# Patient Record
Sex: Male | Born: 1940 | Race: White | Hispanic: No | Marital: Married | Smoking: Never smoker
Health system: Southern US, Community
[De-identification: ages and names within clinical notes are randomized; demographics above are authoritative.]

## PROBLEM LIST (undated history)

## (undated) DIAGNOSIS — E669 Obesity, unspecified: Secondary | ICD-10-CM

## (undated) DIAGNOSIS — Z9989 Dependence on other enabling machines and devices: Secondary | ICD-10-CM

## (undated) DIAGNOSIS — F419 Anxiety disorder, unspecified: Secondary | ICD-10-CM

## (undated) DIAGNOSIS — N200 Calculus of kidney: Secondary | ICD-10-CM

## (undated) DIAGNOSIS — K922 Gastrointestinal hemorrhage, unspecified: Secondary | ICD-10-CM

## (undated) DIAGNOSIS — I10 Essential (primary) hypertension: Secondary | ICD-10-CM

## (undated) DIAGNOSIS — K635 Polyp of colon: Secondary | ICD-10-CM

## (undated) DIAGNOSIS — E119 Type 2 diabetes mellitus without complications: Secondary | ICD-10-CM

## (undated) DIAGNOSIS — Z8719 Personal history of other diseases of the digestive system: Secondary | ICD-10-CM

## (undated) DIAGNOSIS — K5790 Diverticulosis of intestine, part unspecified, without perforation or abscess without bleeding: Secondary | ICD-10-CM

## (undated) DIAGNOSIS — G4733 Obstructive sleep apnea (adult) (pediatric): Secondary | ICD-10-CM

## (undated) DIAGNOSIS — K219 Gastro-esophageal reflux disease without esophagitis: Secondary | ICD-10-CM

## (undated) DIAGNOSIS — M1990 Unspecified osteoarthritis, unspecified site: Secondary | ICD-10-CM

## (undated) HISTORY — PX: LITHOTRIPSY: SUR834

## (undated) HISTORY — PX: KNEE CARTILAGE SURGERY: SHX688

## (undated) HISTORY — PX: TONSILLECTOMY AND ADENOIDECTOMY: SUR1326

## (undated) HISTORY — DX: Diverticulosis of intestine, part unspecified, without perforation or abscess without bleeding: K57.90

---

## 2006-07-08 ENCOUNTER — Ambulatory Visit (HOSPITAL_COMMUNITY): Admission: RE | Admit: 2006-07-08 | Discharge: 2006-07-08 | Payer: Self-pay | Admitting: *Deleted

## 2006-07-18 ENCOUNTER — Ambulatory Visit (HOSPITAL_COMMUNITY): Admission: RE | Admit: 2006-07-18 | Discharge: 2006-07-18 | Payer: Self-pay | Admitting: *Deleted

## 2006-07-18 ENCOUNTER — Encounter (INDEPENDENT_AMBULATORY_CARE_PROVIDER_SITE_OTHER): Payer: Self-pay | Admitting: *Deleted

## 2006-08-29 ENCOUNTER — Ambulatory Visit (HOSPITAL_COMMUNITY): Admission: RE | Admit: 2006-08-29 | Discharge: 2006-08-29 | Payer: Self-pay | Admitting: *Deleted

## 2006-08-29 ENCOUNTER — Encounter (INDEPENDENT_AMBULATORY_CARE_PROVIDER_SITE_OTHER): Payer: Self-pay | Admitting: *Deleted

## 2010-01-28 ENCOUNTER — Encounter: Payer: Self-pay | Admitting: *Deleted

## 2010-04-07 ENCOUNTER — Inpatient Hospital Stay (HOSPITAL_COMMUNITY)
Admission: EM | Admit: 2010-04-07 | Discharge: 2010-04-11 | DRG: 377 | Disposition: A | Discharge status: Home / Self Care | Payer: 59 | Attending: Internal Medicine | Admitting: Internal Medicine

## 2010-04-07 ENCOUNTER — Emergency Department (HOSPITAL_COMMUNITY): Payer: 59

## 2010-04-07 ENCOUNTER — Encounter (HOSPITAL_COMMUNITY): Payer: Self-pay | Admitting: Radiology

## 2010-04-07 DIAGNOSIS — Q391 Atresia of esophagus with tracheo-esophageal fistula: Secondary | ICD-10-CM

## 2010-04-07 DIAGNOSIS — M129 Arthropathy, unspecified: Secondary | ICD-10-CM | POA: Diagnosis present

## 2010-04-07 DIAGNOSIS — G4733 Obstructive sleep apnea (adult) (pediatric): Secondary | ICD-10-CM | POA: Diagnosis present

## 2010-04-07 DIAGNOSIS — Z7982 Long term (current) use of aspirin: Secondary | ICD-10-CM

## 2010-04-07 DIAGNOSIS — I10 Essential (primary) hypertension: Secondary | ICD-10-CM | POA: Diagnosis present

## 2010-04-07 DIAGNOSIS — D126 Benign neoplasm of colon, unspecified: Secondary | ICD-10-CM | POA: Diagnosis present

## 2010-04-07 DIAGNOSIS — K219 Gastro-esophageal reflux disease without esophagitis: Secondary | ICD-10-CM | POA: Diagnosis present

## 2010-04-07 DIAGNOSIS — D62 Acute posthemorrhagic anemia: Secondary | ICD-10-CM | POA: Diagnosis present

## 2010-04-07 DIAGNOSIS — K449 Diaphragmatic hernia without obstruction or gangrene: Secondary | ICD-10-CM | POA: Diagnosis present

## 2010-04-07 DIAGNOSIS — E119 Type 2 diabetes mellitus without complications: Secondary | ICD-10-CM | POA: Diagnosis present

## 2010-04-07 DIAGNOSIS — E876 Hypokalemia: Secondary | ICD-10-CM | POA: Diagnosis not present

## 2010-04-07 DIAGNOSIS — K5711 Diverticulosis of small intestine without perforation or abscess with bleeding: Principal | ICD-10-CM | POA: Diagnosis present

## 2010-04-07 HISTORY — DX: Essential (primary) hypertension: I10

## 2010-04-07 LAB — DIFFERENTIAL
Basophils Absolute: 0 10*3/uL (ref 0.0–0.1)
Eosinophils Absolute: 0 10*3/uL (ref 0.0–0.7)
Eosinophils Absolute: 0.1 10*3/uL (ref 0.0–0.7)
Eosinophils Relative: 1 % (ref 0–5)
Lymphs Abs: 0.8 10*3/uL (ref 0.7–4.0)
Monocytes Absolute: 0.4 10*3/uL (ref 0.1–1.0)
Monocytes Relative: 4 % (ref 3–12)
Neutrophils Relative %: 89 % — ABNORMAL HIGH (ref 43–77)

## 2010-04-07 LAB — CBC
HCT: 32.8 % — ABNORMAL LOW (ref 39.0–52.0)
Hemoglobin: 11.3 g/dL — ABNORMAL LOW (ref 13.0–17.0)
MCH: 31.1 pg (ref 26.0–34.0)
MCHC: 33.8 g/dL (ref 30.0–36.0)
MCV: 90.4 fL (ref 78.0–100.0)
MCV: 90.2 fL (ref 78.0–100.0)
Platelets: 235 10*3/uL (ref 150–400)
Platelets: 208 10*3/uL (ref 150–400)
RBC: 3.63 MIL/uL — ABNORMAL LOW (ref 4.22–5.81)
RDW: 12.3 % (ref 11.5–15.5)
WBC: 8.4 10*3/uL (ref 4.0–10.5)

## 2010-04-07 LAB — COMPREHENSIVE METABOLIC PANEL
Alkaline Phosphatase: 50 U/L (ref 39–117)
BUN: 19 mg/dL (ref 6–23)
CO2: 25 mEq/L (ref 19–32)
Chloride: 104 mEq/L (ref 96–112)
Creatinine, Ser: 1.04 mg/dL (ref 0.4–1.5)
GFR calc non Af Amer: >60 mL/min (ref >60)
Glucose, Bld: 318 mg/dL — ABNORMAL HIGH (ref 70–99)
Total Bilirubin: 1.1 mg/dL (ref 0.3–1.2)

## 2010-04-07 LAB — PROTIME-INR
INR: 1.09 (ref 0.00–1.49)
Prothrombin Time: 14.3 seconds (ref 11.6–15.2)

## 2010-04-07 LAB — HEMOGLOBIN AND HEMATOCRIT, BLOOD
HCT: 26.1 % — ABNORMAL LOW (ref 39.0–52.0)
HCT: 32 % — ABNORMAL LOW (ref 39.0–52.0)
Hemoglobin: 9 g/dL — ABNORMAL LOW (ref 13.0–17.0)

## 2010-04-07 LAB — APTT: aPTT: 27 seconds (ref 24–37)

## 2010-04-07 LAB — ABO/RH: ABO/RH(D): O POS

## 2010-04-07 MED ORDER — IOHEXOL 300 MG/ML  SOLN
100.0000 mL | Freq: Once | INTRAMUSCULAR | Status: AC | PRN
  Administered 2010-04-07: 100 mL via INTRAVENOUS

## 2010-04-08 ENCOUNTER — Inpatient Hospital Stay (HOSPITAL_COMMUNITY): Payer: 59

## 2010-04-08 DIAGNOSIS — K625 Hemorrhage of anus and rectum: Secondary | ICD-10-CM

## 2010-04-08 DIAGNOSIS — I959 Hypotension, unspecified: Secondary | ICD-10-CM

## 2010-04-08 DIAGNOSIS — K921 Melena: Secondary | ICD-10-CM

## 2010-04-08 DIAGNOSIS — R579 Shock, unspecified: Secondary | ICD-10-CM

## 2010-04-08 DIAGNOSIS — D62 Acute posthemorrhagic anemia: Secondary | ICD-10-CM

## 2010-04-08 DIAGNOSIS — K222 Esophageal obstruction: Secondary | ICD-10-CM

## 2010-04-08 LAB — BASIC METABOLIC PANEL
BUN: 11 mg/dL (ref 6–23)
Chloride: 108 mEq/L (ref 96–112)
GFR calc non Af Amer: >60 mL/min (ref >60)
Glucose, Bld: 138 mg/dL — ABNORMAL HIGH (ref 70–99)
Potassium: 3.8 mEq/L (ref 3.5–5.1)
Sodium: 140 mEq/L (ref 135–145)

## 2010-04-08 LAB — GLUCOSE, CAPILLARY
Glucose-Capillary: 174 mg/dL — ABNORMAL HIGH (ref 70–99)
Glucose-Capillary: 169 mg/dL — ABNORMAL HIGH (ref 70–99)

## 2010-04-08 LAB — CBC
HCT: 29.9 % — ABNORMAL LOW (ref 39.0–52.0)
Hemoglobin: 10.3 g/dL — ABNORMAL LOW (ref 13.0–17.0)
MCHC: 34.4 g/dL (ref 30.0–36.0)
MCV: 86.4 fL (ref 78.0–100.0)

## 2010-04-08 LAB — HEMOGLOBIN AND HEMATOCRIT, BLOOD: Hemoglobin: 11.9 g/dL — ABNORMAL LOW (ref 13.0–17.0)

## 2010-04-08 LAB — PREPARE RBC (CROSSMATCH)

## 2010-04-09 ENCOUNTER — Other Ambulatory Visit: Payer: Self-pay | Admitting: Gastroenterology

## 2010-04-09 DIAGNOSIS — K921 Melena: Secondary | ICD-10-CM

## 2010-04-09 DIAGNOSIS — K5730 Diverticulosis of large intestine without perforation or abscess without bleeding: Secondary | ICD-10-CM

## 2010-04-09 DIAGNOSIS — D126 Benign neoplasm of colon, unspecified: Secondary | ICD-10-CM

## 2010-04-09 LAB — CBC
HCT: 29.2 % — ABNORMAL LOW (ref 39.0–52.0)
MCH: 29.7 pg (ref 26.0–34.0)
MCHC: 34.4 g/dL (ref 30.0–36.0)
MCV: 86.4 fL (ref 78.0–100.0)
MCV: 86.3 fL (ref 78.0–100.0)
Platelets: 155 10*3/uL (ref 150–400)
Platelets: 157 10*3/uL (ref 150–400)
RBC: 3.38 MIL/uL — ABNORMAL LOW (ref 4.22–5.81)
RDW: 14.7 % (ref 11.5–15.5)
WBC: 8.9 10*3/uL (ref 4.0–10.5)
WBC: 9.6 10*3/uL (ref 4.0–10.5)

## 2010-04-09 LAB — DIFFERENTIAL
Eosinophils Absolute: 0.3 10*3/uL (ref 0.0–0.7)
Eosinophils Relative: 3 % (ref 0–5)
Lymphs Abs: 1.1 10*3/uL (ref 0.7–4.0)
Monocytes Absolute: 0.5 10*3/uL (ref 0.1–1.0)
Monocytes Relative: 5 % (ref 3–12)

## 2010-04-09 LAB — GLUCOSE, CAPILLARY
Glucose-Capillary: 154 mg/dL — ABNORMAL HIGH (ref 70–99)
Glucose-Capillary: 127 mg/dL — ABNORMAL HIGH (ref 70–99)

## 2010-04-09 LAB — BASIC METABOLIC PANEL
Calcium: 7.4 mg/dL — ABNORMAL LOW (ref 8.4–10.5)
Creatinine, Ser: 0.78 mg/dL (ref 0.4–1.5)
GFR calc non Af Amer: >60 mL/min (ref >60)
Glucose, Bld: 80 mg/dL (ref 70–99)
Sodium: 143 mEq/L (ref 135–145)

## 2010-04-10 DIAGNOSIS — K5731 Diverticulosis of large intestine without perforation or abscess with bleeding: Secondary | ICD-10-CM

## 2010-04-10 LAB — CBC
HCT: 25.3 % — ABNORMAL LOW (ref 39.0–52.0)
Hemoglobin: 9 g/dL — ABNORMAL LOW (ref 13.0–17.0)
MCHC: 34.7 g/dL (ref 30.0–36.0)
MCHC: 35.6 g/dL (ref 30.0–36.0)
MCV: 86.9 fL (ref 78.0–100.0)
Platelets: 163 10*3/uL (ref 150–400)
RDW: 14.5 % (ref 11.5–15.5)
RDW: 14.3 % (ref 11.5–15.5)
WBC: 8 10*3/uL (ref 4.0–10.5)
WBC: 7.4 10*3/uL (ref 4.0–10.5)

## 2010-04-10 LAB — BASIC METABOLIC PANEL
Calcium: 7.6 mg/dL — ABNORMAL LOW (ref 8.4–10.5)
GFR calc non Af Amer: >60 mL/min (ref >60)
Potassium: 3.1 mEq/L — ABNORMAL LOW (ref 3.5–5.1)
Sodium: 140 mEq/L (ref 135–145)

## 2010-04-10 LAB — GLUCOSE, CAPILLARY
Glucose-Capillary: 113 mg/dL — ABNORMAL HIGH (ref 70–99)
Glucose-Capillary: 175 mg/dL — ABNORMAL HIGH (ref 70–99)
Glucose-Capillary: 220 mg/dL — ABNORMAL HIGH (ref 70–99)
Glucose-Capillary: 232 mg/dL — ABNORMAL HIGH (ref 70–99)

## 2010-04-11 LAB — CBC
HCT: 25 % — ABNORMAL LOW (ref 39.0–52.0)
MCH: 31.7 pg (ref 26.0–34.0)
MCV: 88 fL (ref 78.0–100.0)
Platelets: 172 10*3/uL (ref 150–400)
RBC: 2.84 MIL/uL — ABNORMAL LOW (ref 4.22–5.81)
WBC: 6.3 10*3/uL (ref 4.0–10.5)

## 2010-04-11 LAB — TYPE AND SCREEN
ABO/RH(D): O POS
Antibody Screen: NEGATIVE
Unit division: 0
Unit division: 0
Unit division: 0
Unit division: 0

## 2010-04-11 LAB — BASIC METABOLIC PANEL
BUN: 5 mg/dL — ABNORMAL LOW (ref 6–23)
CO2: 25 mEq/L (ref 19–32)
Chloride: 113 mEq/L — ABNORMAL HIGH (ref 96–112)
Creatinine, Ser: 0.87 mg/dL (ref 0.4–1.5)
Glucose, Bld: 120 mg/dL — ABNORMAL HIGH (ref 70–99)
Potassium: 3.4 mEq/L — ABNORMAL LOW (ref 3.5–5.1)

## 2010-04-11 LAB — GLUCOSE, CAPILLARY: Glucose-Capillary: 200 mg/dL — ABNORMAL HIGH (ref 70–99)

## 2010-04-15 NOTE — Discharge Summary (Signed)
NAME:  Aaron White, Aaron White NO.:  [id number]  MEDICAL RECORD NO.:  [id number]           PATIENT TYPE:  I  LOCATION:  3701                         FACILITY:  [facility]  PHYSICIAN:  Lonia Blood, M.D.       DATE OF BIRTH:  04-12-1940  DATE OF ADMISSION:  04/07/2010 DATE OF DISCHARGE:  04/11/2010                              DISCHARGE SUMMARY   PRIMARY CARE PHYSICIAN:  Juline Patch, MD  DISCHARGE DIAGNOSES: 1. Lower gastrointestinal bleeding with hemorrhagic shock. 2. Acute blood loss anemia status post transfused 6 units of packed     red blood cells. 3. Diabetes mellitus type 2. 4. Obstructive sleep apnea. 5. Hypertension. 6. Gastroesophageal reflux disease. 7. History of colon polyps. 8. Esophageal ring.  DISCHARGE MEDICATIONS: 1. Tylenol 650 mg by mouth every 4 hours as needed for pain. 2. Omeprazole 40 mg twice a day. 3. Metformin 500 mg by mouth twice a day. 4. Micardis/hydrochlorothiazide 80/25 daily. 5. Multivitamin daily. 6. Osteo Bi-Flex twice a day. 7. Resveratrol 1 capsule daily. 8. Saw Palmetto 1 capsule by mouth twice a day. 9. Vitamin C 500 mg daily. 10.Vitamin E 400 units daily.  CONDITION ON DISCHARGE:  Aaron White was discharged in good condition, alert, oriented, no acute distress.  No further bleeding.  Aaron White was told to hold his aspirin until Aaron White sees Dr. Marina Goodell.  The patient will follow up with Dr. Yancey Flemings from [facility], call [phone] to schedule an appointment.  Aaron White will also follow up with Dr. Juline Patch. At the time of discharge, the patient's discharge hemoglobin is 9.  PROCEDURE THIS ADMISSION: 1. The patient underwent transfusion of 6 units of packed red blood     cells. 2. Placement of central line into the right subclavian. 3. Colonoscopy with finding of diverticuli. 4. Upper endoscopy with findings of an esophageal ring hiatal hernia     and no source of bleeding in the stomach or duodenum.  CONSULTATION DURING  THIS ADMISSION:  The patient was seen in consultation by [facility].  HOSPITAL COURSE:  For complete hospital course, refer to the dictated progress note done by Dr. Ramiro Harvest on April 10, 2010.  Briefly, Aaron White is a 70 year old gentleman who presented to the emergency room with massive lower gastrointestinal bleeding.  Aaron White was admitted to the intensive care unit and had a right subclavian central line placed. Aaron White was transfused 6 units of packed red blood cells.  Aaron White initially underwent emergent endoscopy without any source of upper gastrointestinal bleeding being found.  Aaron White subsequently underwent colonoscopy with findings of multiple diverticuli felt to be the source of his current bleeding.  Aaron White stabilized over the course of the hospitalization and his discharge hemoglobin was 9.  Aaron White was observed for 24 hours during which period of time Aaron White had no further bleeding and had normal bowel movements.  Aaron White is getting discharged today, April 11, 2010, in good condition.     Lonia Blood, M.D.     SL/MEDQ  D:  04/11/2010  T:  04/11/2010  Job:  [id number]  cc:   Juline Patch, M.D. Wilhemina Bonito. Marina Goodell,  MD  Electronically Signed by Lonia Blood M.D. on 04/15/2010 10:23:20 AM

## 2010-04-16 NOTE — H&P (Signed)
NAME:  Aaron White, Aaron White NO.:  [id number]  MEDICAL RECORD NO.:  [id number]           PATIENT TYPE:  E  LOCATION:  [facility]                         FACILITY:  [facility]  PHYSICIAN:  Jeoffrey Massed, MD    DATE OF BIRTH:  [birth year]  DATE OF ADMISSION:  04/07/2010 DATE OF DISCHARGE:                             HISTORY & PHYSICAL   PRIMARY CARE PRACTITIONER:  Juline Patch, MD  CHIEF COMPLAINT:  Hematochezia x3 episode.  HISTORY OF PRESENT ILLNESS:  The patient is a 70 year old Caucasian male with a past medical history of diabetes, hypertension, history of prior lower GI bleed secondary to questionable bleeding polyp and apparently gets surveillance colonoscopy every 3 years.  Comes in with the above- noted complaints.  Per patient, he was in his usual state of health. Aaron White out with his son to eat dinner last night.  Came back home, felt uneasy in his lower belly and then had a bowel movement that was grosslybloody.  This morning, he woke up and had further two episodes of bloody bowel movements.  The last episode being around 9 a.m.  He subsequently presented to the ED for further evaluation.  The patient does claim he has some mild lower abdominal discomfort, but denies pain.  The patient denies fever.  The patient denies nausea.  The patient denies shortness of breath.  He also denies chest pain.  Denies any headache.  ALLERGIES:  APPARENTLY PENICILLIN, WHICH CAUSES A RASH.  PAST MEDICAL HISTORY: 1. Diabetes. 2. Hypertension. 3. Gastroesophageal reflux disease.  PAST SURGICAL HISTORY:  None.  MEDICATIONS:  At home include, 1. Metformin 500 mg 1 tablet twice daily. 2. Omeprazole 20 mg 1 capsule daily. 3. Micardis HCTZ 80/25 one tablet daily. 4. Aspirin 81 mg 1 tablet daily. 5. Multivitamins 1 tablet daily. 6. Vitamin C 500 mg 1 tablet daily. 7. Vitamin E 400 units 1 capsule daily.  FAMILY HISTORY:  Noncontributory.  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  He denies any toxic habits.  REVIEW OF SYSTEMS:  A detailed review of 12 systems were done and these are negative except for the one mentioned in the HPI.  PHYSICAL EXAM:  VITAL SIGNS:  Initial vital signs was temperature of 97.3, heart rate of 103, blood pressure 108/62, respiration of 20, and pulse ox of 97% on room air. GENERAL:  The patient lying in bed, does not appear to be in any distress. HEENT:  Atraumatic, normocephalic.  Pupils equally react to light and accommodation. NECK:  Supple. CHEST:  Bilaterally clear to auscultation. CARDIOVASCULAR:  Heart sounds are regular.  No murmurs heard. ABDOMEN:  Soft, nontender, and nondistended. EXTREMITIES:  There is no edema. NEUROLOGY:  The patient is alert and oriented x3 and there are no focal neurological deficits.  LABORATORY DATA: 1. CBC showed a WBC of 12.6, hemoglobin of 11.3, and a platelet count     of 235. 2. Chemistry shows sodium of 137, potassium of 4.2, chloride of 104,     bicarb of 25, glucose of 318, BUN of 19, creatinine of 1.04.  Liver function test  shows a total bilirubin of 1.1, alkaline phosphatase of 50, AST of 21, ALT of 19, total protein of 6.1, albumin of 3.5, and a calcium of 8.9. 1. INR is 1.09. 2. PTT is 27.  ASSESSMENT: 1. Lower gastrointestinal bleed, etiology not very evident, rule out     ischemic colitis, rule out diverticular bleed, rule out bleeding     polyp. 2. Hypertension, currently stable. 3. History of diabetes, currently moderately well controlled.  PLAN: 1. This patient will be admitted to a telemetry unit.  He will be     hydrated. 2. He will be type and crossmatch. 3. We will follow his hemoglobin and hematocrit closely for the next     24 hours and if his hemoglobin continues to drop, we will transfuse     him. 4. GI consultation with [facility] has already been     obtained. 5. We will continue his proton pump  inhibitor. 6. We will get an acute abdominal series for now. 7. Further recommendations and plan will be based as the patient's     clinical course evolves. 8. DVT prophylaxis with bilateral SCDs.  CODE STATUS:  Patient is a full code.  Total time spent 45 minutes.     Jeoffrey Massed, MD     SG/MEDQ  D:  04/07/2010  T:  04/07/2010  Job:  [id number]  cc:   Juline Patch, M.D.  Electronically Signed by Jeoffrey Massed  on 04/16/2010 08:28:39 PM

## 2010-04-18 ENCOUNTER — Telehealth: Payer: Self-pay | Admitting: Gastroenterology

## 2010-04-18 NOTE — Telephone Encounter (Signed)
It is really not necessary for him to followup with me for an acute diverticular bleed which has been worked up and resolved. He should however followup with his PCP. If for some reason he would like to see me, then that would be fine.

## 2010-04-18 NOTE — Telephone Encounter (Signed)
Pt with ENDO on 04/08/10 with Dr Marina Goodell showing esophageal ring, HH no active bleeding. COLON on 04/09/10 with Dr Arlyce Dice showing diverticula, old blood and polyp. Discharge note listed pt to f/u with Dr Marina Goodell. Dr Marina Goodell is this correct?

## 2010-04-18 NOTE — Telephone Encounter (Signed)
Informed pt that Dr Marina Goodell stated he can f/u with his PCP since his problem was acute and has been resolved, but if he chooses, Dr Marina Goodell would be happy to see him. Pt verbalized understanding and will see Dr Ricki Miller next week.

## 2010-05-16 NOTE — Group Therapy Note (Signed)
NAME:  Aaron White, Aaron White NO.:  [id number]  MEDICAL RECORD NO.:  [id number]           PATIENT TYPE:  I  LOCATION:  2105                         FACILITY:  [facility]  PHYSICIAN:  Ramiro Harvest, MD    DATE OF BIRTH:  05-Sep-1940                                PROGRESS NOTE   PRIMARY CARE PHYSICIAN: Juline Patch, MD  GASTROENTEROLOGIST: Wilhemina Bonito. Marina Goodell, MD  DATE OF DISCHARGE: To be determined.  DISCHARGE DIAGNOSES: 1. Gastrointestinal bleed, likely diverticular in nature. 2. Acute blood loss anemia status post transfusion of 6 units packed     red blood cells. 3. Type 2 diabetes. 4. Hypertension. 5. Gastroesophageal reflux disease with hiatal hernia. 6. Obstructive sleep apnea on continuous positive airway pressure at     night. 7. Lower esophageal ring. 8. Colon polyps status post removal on April 2. 9. Hypokalemia, repleted.  HISTORY OF PRESENT ILLNESS: Aaron White is a very pleasant 70 year old male with a past medical history of diabetes and hypertension.  He also had a prior GI bleed secondary to a questionable bleeding polyp.  He receives a colonoscopy surveillance every 3 years by Dr. Yancey Flemings at [facility].  He presented to the emergency department on March 31 with bright red blood per rectum x3.  The patient only has mild lower abdominal discomfort.  HOSPITAL COURSE: The patient was admitted through the ED to telemetry bed.  Shortly after admission, he had a large bowel movement with frank blood and a presyncopal episode.  His vitals remained stable, but he was moved to the medical ICU for closer monitoring.  His hemoglobin was approximately 9.9.  CT abdomen and pelvis was negative for ischemic colitis.  Dr. Jeoffrey Massed consulted [facility] to see the patient on April 1.  The patient had an upper endoscopy by Dr. Yancey Flemings.  Dr. Marina Goodell saw no active bleeding and suspected a lower GI source.  He also saw a ring in the distal esophagus, hiatal  hernia, next otherwise normal stomach, normal duodenum.  The patient was returned to the ICU for further monitoring and received a colonoscopy prep with plans for colonoscopy on April 2.  On April 2, the patient underwent colonoscopy.  He had scattered diverticula that was felt to be likely the source of his GI bleeding.  He also had a 4-mm sessile polyp in the sigmoid colon. Otherwise, it was in normal examination.  The polyp was removed on April 2 and sent to pathology.  Dr. Marzetta Board note stated that if any further bleeding occurred, the patient was to receive a nuclear medicine bleeding scan.  However, fortunately, no further bleeding has occurred. Today is April 3.  The patient appears well.  He had a meal last night that caused no pain.  No further bleeding.  He has had no bowel movements since his colonoscopy.  His blood pressure is currently systolic of 130 and he is up moving around the room to the bathroom on his own without dizziness.  CONSULTATION: [facility] who saw the patient on April 1.  PROCEDURE: 1. On March 31 continuing into April 1, the patient received  6 units     of packed red blood cells. 2. April 1, the patient received a right subclavian line. 3. On April 1, endoscopy by Dr. Yancey Flemings as described above. 4. April 2, colonoscopy by Dr. Arlyce Dice as described above.  PHYSICAL EXAMINATION: VITAL SIGNS:  Temperature 97.5, pulse 90, blood pressure 150/74, respiratory rate 19, O2 saturation 99% on room air.  Ins and outs daily totalled.  Total output on April 08, 2298.  Total ins 4300 at net 2000. GENERAL:  The patient is alert and oriented in no apparent distress.  He is pleasant to speak with. HEENT:  Head is atraumatic, normocephalic.  Eyes are anicteric.  Pupils are equal, round, reactive to light.  Nose shows no nasal discharge or exterior lesions.  Mouth has moist mucous membranes with good dentition. NECK:  Supple with midline trachea.  No JVD.  No  lymphadenopathy. CHEST:  Demonstrates no accessory muscle use.  He has no wheezes or crackles to my exam. HEART:  Regular rate and rhythm.  No obvious murmurs, rubs, or gallops. ABDOMEN:  Soft, nontender, nondistended with good bowel sounds. EXTREMITIES:  No clubbing, cyanosis, or edema.  He is able to move all 4 extremities without difficulty. SKIN:  No rashes.  He has a bruise on his lower right foot that he had prior to admission.  He has no lesion. NEUROLOGIC:  Cranial nerves II through XII are grossly intact.  There is no facial asymmetries.  No obvious focal neuro deficits. PSYCHIATRIC:  The patient is alert and oriented.  His demeanor is pleasant, cooperative.  His grooming is excellent.  LABORATORY DATA: Today are pertinent for a hemoglobin of 9.2, this is down from 10.6 yesterday.  On March 31, he had a low hemoglobin of 9.0, that appears to be and has been his lowest this admission. Hematocrit today 26.5, white blood count 8.0, platelets 163.  Sodium 140, potassium 3.1, chloride 111, bicarb 24, glucose 118, BUN 6, creatinine 0.82, calcium 7.2.  RADIOLOGICAL EXAMS: The patient had a chest x-ray this admission on April 1 to verify line placement.  There was no pneumothorax.  Lungs were clear.  Heart was normal size.  There were no effusions.  MEDICATIONS: The patient's blood pressure medication was discontinued at admission. He was on Micardis with HCTZ and we will restart his Micardis at half dose and not include the HCTZ at this point.  He is on Protonix 40 mg p.o. b.i.d., that will be up to Gastroenterology to manage.  Prior to admission, he was also on 81 mg of aspirin.  It will up to Gastroenterology to restart this when appropriate.  Prior to admission, he was on metformin 500 mg 1 tablet t.i.d.  He has been on sliding scale insulin in-house.  He has an allergy to PENICILLIN in that it causes a rash.  THINGS TO FOLLOW UP ON: The patient had a polyp removed on  April 2.  I would anticipate that pathology would be returned on or about April 5.     Stephani Police, PA   ______________________________ Ramiro Harvest, MD    MLY/MEDQ  D:  04/10/2010  T:  04/10/2010  Job:  [id number]  cc:   Juline Patch, M.D. Fax: [phone]  Electronically Signed by Algis Downs PA on 04/16/2010 09:45:52 AM Electronically Signed by Ramiro Harvest MD on 05/16/2010 03:02:32 PM

## 2010-05-22 NOTE — Op Note (Signed)
NAME:  LEIF, LOFLIN NO.:  [id number]   MEDICAL RECORD NO.:  [id number]          PATIENT TYPE:  AMB   LOCATION:  ENDO                         FACILITY:  [facility]   PHYSICIAN:  Georgiana Spinner, M.D.    DATE OF BIRTH:  [birth year]   DATE OF PROCEDURE:  07/18/2006  DATE OF DISCHARGE:                               OPERATIVE REPORT   Send a copy of report of his endoscopy just dictated to Dr. Nilda Simmer  at [facility] [place].           ______________________________  Georgiana Spinner, M.D.     GMO/MEDQ  D:  07/18/2006  T:  07/18/2006  Job:  [id number]

## 2010-05-22 NOTE — Op Note (Signed)
NAME:  Aaron White, Aaron White NO.:  [id number]   MEDICAL RECORD NO.:  [id number]          PATIENT TYPE:  AMB   LOCATION:  ENDO                         FACILITY:  [facility]   PHYSICIAN:  Georgiana Spinner, M.D.    DATE OF BIRTH:  07-30-1940   DATE OF PROCEDURE:  07/18/2006  DATE OF DISCHARGE:                               OPERATIVE REPORT   PROCEDURE:  Upper endoscopy with biopsy.   INDICATIONS:  Gastroesophageal reflux disease with dysphagia.   ANESTHESIA:  Fentanyl 50 mcg, Versed 4 mg.   PROCEDURE:  With the patient mildly sedated in the left lateral  decubitus position, the Pentax videoscopic endoscope was inserted in the  mouth and passed under direct vision through the esophagus which  appeared normal until we reached distal esophagus there were changes of  esophagitis with ulcers and erythema clearly seen.  This was  photographed and biopsy was taken of this area.  We entered into the  stomach.  Fundus, body, antrum, duodenal bulb, second portion duodenum  all appeared normal.  From this point the endoscope was slowly withdrawn  taking circumferential views of duodenal mucosa until the endoscope had  been pulled back into the stomach placed in retroflexion to view the  stomach from below and a widely patent gastroesophageal junction was  seen.  The endoscope was straightened and withdrawn taking  circumferential views remaining gastric and esophageal mucosa.  The  patient's vital signs, pulse oximeter remained stable.  The patient  tolerated procedure well without apparent complications.   FINDINGS:  Marked esophagitis at least grade 2.   PLAN:  Rather than dilation will start the patient on PPI, see how he  responds and have him back for follow-up for further evaluation as an  outpatient.           ______________________________  Georgiana Spinner, M.D.     GMO/MEDQ  D:  07/18/2006  T:  07/19/2006  Job:  [id number]

## 2010-05-22 NOTE — Op Note (Signed)
NAME:  Aaron White, Aaron White NO.:  [id number]   MEDICAL RECORD NO.:  [id number]          PATIENT TYPE:  AMB   LOCATION:  ENDO                         FACILITY:  [facility]   PHYSICIAN:  Georgiana Spinner, M.D.    DATE OF BIRTH:  1940-07-02   DATE OF PROCEDURE:  08/29/2006  DATE OF DISCHARGE:                               OPERATIVE REPORT   PROCEDURE:  Colonoscopy with biopsy and eradication of polyp.   ANESTHESIA:  Fentanyl 50 mcg, Versed 5 mg.   PROCEDURE:  With the patient mildly sedated in the left lateral  decubitus position, a rectal examination was performed which was  unremarkable.  Prostate felt normal.  Subsequently the Pentax  videoscopic colonoscope was inserted into the rectum and passed under  direct vision to the cecum, identified by the ileocecal valve and  appendiceal orifice, both of which were photographed.  From this point  the colonoscope was slowly withdrawn taking circumferential views of the  colonic mucosa after stopping first in the cecum, where a polyp was seen  and photographed.  It was fairly extensive.  It was probably 1-2 cm in  size but flat and velvety in appearance that we first biopsied and then  used the ERBE argon photocoagulator APC to eradicate.  Once we had done  this to our satisfaction, the colonoscope was then, as noted,  withdrawn  taking circumferential views of the colonic mucosa stopping then only in  the rectum, which appeared normal on direct and retroflexed view.  The  endoscope was straightened and withdrawn.  The patient's vital signs and  pulse oximetry remained stable.  The patient tolerated the procedure  well without apparent complication.   FINDINGS:  Polyp of cecum.   Await biopsy report.  The patient will call me for results and follow up  with me as needed as an outpatient.           ______________________________  Georgiana Spinner, M.D.     GMO/MEDQ  D:  08/29/2006  T:  08/30/2006  Job:  [id number]   cc:   Nilda Simmer, M.D.  Fax: [phone]

## 2010-12-28 ENCOUNTER — Encounter (HOSPITAL_COMMUNITY): Payer: Self-pay | Admitting: *Deleted

## 2010-12-28 ENCOUNTER — Emergency Department (HOSPITAL_COMMUNITY)
Admission: EM | Admit: 2010-12-28 | Discharge: 2010-12-28 | Discharge status: Against Medical Advice | Payer: 59 | Attending: Emergency Medicine | Admitting: Emergency Medicine

## 2010-12-28 DIAGNOSIS — R040 Epistaxis: Secondary | ICD-10-CM | POA: Insufficient documentation

## 2010-12-28 HISTORY — DX: Dependence on other enabling machines and devices: Z99.89

## 2010-12-28 HISTORY — DX: Obstructive sleep apnea (adult) (pediatric): G47.33

## 2010-12-28 NOTE — ED Notes (Signed)
Pt states "I had 1 yesterday morning, took about 20 minutes to stop, this morning this one I got stopped quickly, another approx 2 wks ago, my son just died & I don't know if my bp is up and that's what caused it"; no active bleeding @ present

## 2010-12-31 ENCOUNTER — Ambulatory Visit (INDEPENDENT_AMBULATORY_CARE_PROVIDER_SITE_OTHER): Payer: 59

## 2010-12-31 DIAGNOSIS — R040 Epistaxis: Secondary | ICD-10-CM

## 2011-01-01 ENCOUNTER — Emergency Department (HOSPITAL_COMMUNITY)
Admission: EM | Admit: 2011-01-01 | Discharge: 2011-01-01 | Disposition: A | Discharge status: Home / Self Care | Payer: 59 | Attending: Emergency Medicine | Admitting: Emergency Medicine

## 2011-01-01 ENCOUNTER — Encounter (HOSPITAL_COMMUNITY): Payer: Self-pay | Admitting: *Deleted

## 2011-01-01 DIAGNOSIS — I10 Essential (primary) hypertension: Secondary | ICD-10-CM | POA: Insufficient documentation

## 2011-01-01 DIAGNOSIS — R040 Epistaxis: Secondary | ICD-10-CM | POA: Insufficient documentation

## 2011-01-01 DIAGNOSIS — E119 Type 2 diabetes mellitus without complications: Secondary | ICD-10-CM | POA: Insufficient documentation

## 2011-01-01 MED ORDER — COCAINE HCL 4 % EX SOLN
CUTANEOUS | Status: AC
  Filled 2011-01-01: qty 4

## 2011-01-01 MED ORDER — OXYMETAZOLINE HCL 0.05 % NA SOLN
1.0000 | Freq: Once | NASAL | Status: DC
  Filled 2011-01-01: qty 15

## 2011-01-01 MED ORDER — COCAINE HCL 4 % EX SOLN: 4.0000 mL | Freq: Once | CUTANEOUS | Status: DC

## 2011-01-01 NOTE — ED Notes (Signed)
Pt sts he was seen at an Urgent [facility] center for nosebleed he had had for 2-3 days. Sts the bleed was arterial and they cauterized it but feels a small part was missed because he still has a "trickle" of blood.

## 2011-01-01 NOTE — ED Notes (Signed)
Tatyana PA administered Afrin 1 spray left nare,  2 silver nitrate sticks, and approximately 2ml of cocaine liquid to nose,  Cocaine gauze in left nare

## 2011-01-01 NOTE — ED Provider Notes (Signed)
Minimal left anterior septal mucosal bleeding now in the ED amenable to 5.5 cm Rapid Rhino placement by physician assistant.  Medical screening examination/treatment/procedure(s) were conducted as a shared visit with non-physician practitioner(s) and myself.  I personally evaluated the patient during the encounter  Hurman Horn, MD 01/02/11 1320

## 2011-01-01 NOTE — ED Provider Notes (Signed)
History     CSN: [id number]  Arrival date & time 01/01/11  1859   First MD Initiated Contact with Patient 01/01/11 1911      Chief Complaint  Patient presents with  . Epistaxis    (Consider location/radiation/quality/duration/timing/severity/associated sxs/prior treatment) Patient is a 70 y.o. male presenting with nosebleeds. The history is provided by the patient.  Epistaxis  This is a recurrent problem.  Pt states about 3 weeks ago, he developed a nose bleed. States bleed is on and off. NO trauma. Was seen at UC 3 days ago, states at that time, bleeding worsened. Had bleed cauterized. States it is now much better, but still trickling down his throat. Denies headache, nausea, vomiting, dizziness.   Past Medical History  Diagnosis Date  . Diabetes mellitus   . Hypertension   . Sleep apnea   . Obstructive sleep apnea on CPAP     Past Surgical History  Procedure Date  . Knee surgery     left  . Tonsillectomy     No family history on file.  History  Substance Use Topics  . Smoking status: Never Smoker   . Smokeless tobacco: Not on file  . Alcohol Use: No      Review of Systems  Constitutional: Negative.   HENT: Positive for nosebleeds.   Eyes: Negative.   Respiratory: Negative.   Cardiovascular: Negative.   Gastrointestinal: Negative.   Genitourinary: Negative.   Musculoskeletal: Negative.   Skin: Negative.   Neurological: Negative.   Psychiatric/Behavioral: Negative.     Allergies  Penicillins  Home Medications   Current Outpatient Rx  Name Route Sig Dispense Refill  . ASPIRIN 81 MG PO TABS Oral Take 81 mg by mouth daily.      Marland Kitchen FLUTICASONE PROPIONATE 50 MCG/ACT NA SUSP Nasal Place 2 sprays into the nose daily.      Marland Kitchen GLUCOSAMINE HCL PO Oral Take 1 tablet by mouth daily.      Marland Kitchen METFORMIN HCL 1000 MG PO TABS Oral Take 1,000 mg by mouth 2 (two) times daily with a meal.      . MULTIVITAMINS PO TABS Oral Take 1 tablet by mouth daily.      Marland Kitchen  OMEPRAZOLE 20 MG PO CPDR Oral Take 20 mg by mouth daily.      . SAW PALMETTO PO Oral Take 1 tablet by mouth daily.      Marland Kitchen SITAGLIPTIN PHOSPHATE 100 MG PO TABS Oral Take 100 mg by mouth daily.      . TELMISARTAN-HCTZ 80-25 MG PO TABS Oral Take 1 tablet by mouth daily.        BP 132/68  Pulse 73  Temp 98.1 F (36.7 C)  Resp 14  SpO2 98%  Physical Exam  Constitutional: He is oriented to person, place, and time. He appears well-developed and well-nourished.  HENT:  Head: Normocephalic.  Right Ear: External ear normal.  Left Ear: External ear normal.  Mouth/Throat: Oropharynx is clear and moist.       eppistaxis left  Eyes: Conjunctivae are normal.  Cardiovascular: Normal rate and regular rhythm.   Pulmonary/Chest: Effort normal and breath sounds normal. No respiratory distress.  Musculoskeletal: Normal range of motion.  Neurological: He is alert and oriented to person, place, and time.  Skin: Skin is warm and dry. No erythema.  Psychiatric: He has a normal mood and affect.    ED Course  Procedures (including critical care time)  Active eppistaxis. Tried afrin, cauterizing it after the area  of bleeding on medial septum was identifed. Bleeding persisted. Tried cocaine solution. Bleeding subsided. Pt given choices to either try topical powder, which "could" work but no guarantee or packing it with ENT follow up. Pt decided to go with packing. Left nostril packed with 5.5 cm rhino rocket. Pt tolerated procedure well. No active bleeding. Will d/ c home with ent follow up.   MDM          Lottie Mussel, PA 01/01/11 2118

## 2011-01-02 NOTE — ED Provider Notes (Signed)
Medical screening examination/treatment/procedure(s) were conducted as a shared visit with non-physician practitioner(s) and myself.  I personally evaluated the patient during the encounter  Ihan Pat M Promise Bushong, MD 01/02/11 1312 

## 2011-01-05 ENCOUNTER — Ambulatory Visit (INDEPENDENT_AMBULATORY_CARE_PROVIDER_SITE_OTHER): Payer: 59

## 2011-01-05 DIAGNOSIS — R040 Epistaxis: Secondary | ICD-10-CM

## 2011-01-05 DIAGNOSIS — S0120XA Unspecified open wound of nose, initial encounter: Secondary | ICD-10-CM

## 2011-06-14 ENCOUNTER — Inpatient Hospital Stay (HOSPITAL_COMMUNITY)
Admission: EM | Admit: 2011-06-14 | Discharge: 2011-06-16 | DRG: 378 | Disposition: A | Discharge status: Home / Self Care | Payer: 59 | Attending: Internal Medicine | Admitting: Internal Medicine

## 2011-06-14 ENCOUNTER — Encounter (HOSPITAL_COMMUNITY): Payer: Self-pay | Admitting: *Deleted

## 2011-06-14 ENCOUNTER — Telehealth: Payer: Self-pay

## 2011-06-14 DIAGNOSIS — G4733 Obstructive sleep apnea (adult) (pediatric): Secondary | ICD-10-CM | POA: Diagnosis present

## 2011-06-14 DIAGNOSIS — K922 Gastrointestinal hemorrhage, unspecified: Secondary | ICD-10-CM | POA: Diagnosis present

## 2011-06-14 DIAGNOSIS — K625 Hemorrhage of anus and rectum: Secondary | ICD-10-CM

## 2011-06-14 DIAGNOSIS — Z79899 Other long term (current) drug therapy: Secondary | ICD-10-CM

## 2011-06-14 DIAGNOSIS — E1165 Type 2 diabetes mellitus with hyperglycemia: Secondary | ICD-10-CM

## 2011-06-14 DIAGNOSIS — Z880 Allergy status to penicillin: Secondary | ICD-10-CM

## 2011-06-14 DIAGNOSIS — I10 Essential (primary) hypertension: Secondary | ICD-10-CM | POA: Diagnosis present

## 2011-06-14 DIAGNOSIS — D62 Acute posthemorrhagic anemia: Secondary | ICD-10-CM | POA: Diagnosis present

## 2011-06-14 DIAGNOSIS — Z7982 Long term (current) use of aspirin: Secondary | ICD-10-CM

## 2011-06-14 DIAGNOSIS — Z9089 Acquired absence of other organs: Secondary | ICD-10-CM

## 2011-06-14 DIAGNOSIS — K921 Melena: Principal | ICD-10-CM | POA: Diagnosis present

## 2011-06-14 DIAGNOSIS — E119 Type 2 diabetes mellitus without complications: Secondary | ICD-10-CM

## 2011-06-14 DIAGNOSIS — K219 Gastro-esophageal reflux disease without esophagitis: Secondary | ICD-10-CM | POA: Diagnosis present

## 2011-06-14 DIAGNOSIS — Z8249 Family history of ischemic heart disease and other diseases of the circulatory system: Secondary | ICD-10-CM

## 2011-06-14 DIAGNOSIS — R112 Nausea with vomiting, unspecified: Secondary | ICD-10-CM

## 2011-06-14 DIAGNOSIS — D649 Anemia, unspecified: Secondary | ICD-10-CM

## 2011-06-14 DIAGNOSIS — Z794 Long term (current) use of insulin: Secondary | ICD-10-CM

## 2011-06-14 HISTORY — DX: Polyp of colon: K63.5

## 2011-06-14 HISTORY — DX: Gastro-esophageal reflux disease without esophagitis: K21.9

## 2011-06-14 HISTORY — DX: Personal history of other diseases of the digestive system: Z87.19

## 2011-06-14 LAB — BASIC METABOLIC PANEL
BUN: 16 mg/dL (ref 6–23)
CO2: 27 mEq/L (ref 19–32)
Calcium: 9.3 mg/dL (ref 8.4–10.5)
Glucose, Bld: 147 mg/dL — ABNORMAL HIGH (ref 70–99)
Sodium: 140 mEq/L (ref 135–145)

## 2011-06-14 LAB — DIFFERENTIAL
Lymphocytes Relative: 14 % (ref 12–46)
Lymphs Abs: 1 10*3/uL (ref 0.7–4.0)
Monocytes Relative: 6 % (ref 3–12)
Neutrophils Relative %: 78 % — ABNORMAL HIGH (ref 43–77)

## 2011-06-14 LAB — CBC
HCT: 36.3 % — ABNORMAL LOW (ref 39.0–52.0)
Hemoglobin: 12.4 g/dL — ABNORMAL LOW (ref 13.0–17.0)
Hemoglobin: 12.3 g/dL — ABNORMAL LOW (ref 13.0–17.0)
MCH: 30 pg (ref 26.0–34.0)
MCH: 30.1 pg (ref 26.0–34.0)
MCHC: 33.9 g/dL (ref 30.0–36.0)
MCV: 89 fL (ref 78.0–100.0)
Platelets: 259 10*3/uL (ref 150–400)
RBC: 4.14 MIL/uL — ABNORMAL LOW (ref 4.22–5.81)
RBC: 4.08 MIL/uL — ABNORMAL LOW (ref 4.22–5.81)
WBC: 7 10*3/uL (ref 4.0–10.5)

## 2011-06-14 LAB — OCCULT BLOOD, POC DEVICE: Fecal Occult Bld: POSITIVE

## 2011-06-14 LAB — TYPE AND SCREEN: Antibody Screen: NEGATIVE

## 2011-06-14 LAB — POCT I-STAT, CHEM 8
BUN: 17 mg/dL (ref 6–23)
Chloride: 104 mEq/L (ref 96–112)
Creatinine, Ser: 0.9 mg/dL (ref 0.50–1.35)
Glucose, Bld: 155 mg/dL — ABNORMAL HIGH (ref 70–99)
Hemoglobin: 12.9 g/dL — ABNORMAL LOW (ref 13.0–17.0)
Potassium: 3.9 mEq/L (ref 3.5–5.1)
Sodium: 141 mEq/L (ref 135–145)

## 2011-06-14 LAB — GLUCOSE, CAPILLARY: Glucose-Capillary: 212 mg/dL — ABNORMAL HIGH (ref 70–99)

## 2011-06-14 LAB — PROTIME-INR: INR: 1.09 (ref 0.00–1.49)

## 2011-06-14 LAB — HEMOGLOBIN AND HEMATOCRIT, BLOOD: HCT: 33.3 % — ABNORMAL LOW (ref 39.0–52.0)

## 2011-06-14 MED ORDER — SODIUM CHLORIDE 0.9 % IJ SOLN
3.0000 mL | Freq: Two times a day (BID) | INTRAMUSCULAR | Status: DC
  Administered 2011-06-14 – 2011-06-15 (×2): 3 mL via INTRAVENOUS

## 2011-06-14 MED ORDER — ONDANSETRON HCL 4 MG PO TABS: 4.0000 mg | ORAL_TABLET | Freq: Four times a day (QID) | ORAL | Status: DC | PRN

## 2011-06-14 MED ORDER — ACETAMINOPHEN 325 MG PO TABS: 650.0000 mg | ORAL_TABLET | Freq: Four times a day (QID) | ORAL | Status: DC | PRN

## 2011-06-14 MED ORDER — ALUM & MAG HYDROXIDE-SIMETH 200-200-20 MG/5ML PO SUSP: 30.0000 mL | Freq: Four times a day (QID) | ORAL | Status: DC | PRN

## 2011-06-14 MED ORDER — INSULIN ASPART 100 UNIT/ML ~~LOC~~ SOLN: 0.0000 [IU] | Freq: Three times a day (TID) | SUBCUTANEOUS | Status: DC

## 2011-06-14 MED ORDER — SODIUM CHLORIDE 0.9 % IV SOLN
INTRAVENOUS | Status: DC
  Administered 2011-06-14 – 2011-06-15 (×2): via INTRAVENOUS

## 2011-06-14 MED ORDER — ALBUTEROL SULFATE (5 MG/ML) 0.5% IN NEBU: 2.5000 mg | INHALATION_SOLUTION | RESPIRATORY_TRACT | Status: DC | PRN

## 2011-06-14 MED ORDER — ACETAMINOPHEN 650 MG RE SUPP: 650.0000 mg | Freq: Four times a day (QID) | RECTAL | Status: DC | PRN

## 2011-06-14 MED ORDER — PANTOPRAZOLE SODIUM 40 MG PO TBEC
40.0000 mg | DELAYED_RELEASE_TABLET | Freq: Every day | ORAL | Status: DC
  Administered 2011-06-14 – 2011-06-15 (×2): 40 mg via ORAL
  Filled 2011-06-14 (×2): qty 1

## 2011-06-14 MED ORDER — ONDANSETRON HCL 4 MG/2ML IJ SOLN: 4.0000 mg | Freq: Four times a day (QID) | INTRAMUSCULAR | Status: DC | PRN

## 2011-06-14 NOTE — Progress Notes (Signed)
Patient placed on cpap of 10cmH20. Patient is on his home settings and tolerating cpap well at this time. RT will continue to monitor.

## 2011-06-14 NOTE — ED Provider Notes (Addendum)
History     CSN: [id number]  Arrival date & time 06/14/11  1029   First MD Initiated Contact with Patient 06/14/11 1214      Chief Complaint  Patient presents with  . Rectal Bleeding    (Consider location/radiation/quality/duration/timing/severity/associated sxs/prior treatment) Patient is a 71 y.o. male presenting with hematochezia. The history is provided by the patient.  Rectal Bleeding   He had an episode of bright red blood per rectum this morning. He has a history of a bleeding polyp in the past. He had to stay in the hospital several days and required 6.2 blood so he went to get here before symptoms got worse. Denies any abdominal pain, nausea, vomiting, and dizziness. Nothing makes his symptoms worse and nothing makes them better. He has only had one episode of blood per rectum. He takes low dose aspirin but is not on any other anticoagulants or antiplatelet medication.  Past Medical History  Diagnosis Date  . Diabetes mellitus   . Hypertension   . Sleep apnea   . Obstructive sleep apnea on CPAP   . Colon polyp     Past Surgical History  Procedure Date  . Knee surgery     left  . Tonsillectomy     No family history on file.  History  Substance Use Topics  . Smoking status: Never Smoker   . Smokeless tobacco: Not on file  . Alcohol Use: No      Review of Systems  Gastrointestinal: Positive for hematochezia.  All other systems reviewed and are negative.    Allergies  Penicillins  Home Medications   Current Outpatient Rx  Name Route Sig Dispense Refill  . ASPIRIN EC 81 MG PO TBEC Oral Take 81 mg by mouth daily.    Marland Kitchen METFORMIN HCL 1000 MG PO TABS Oral Take 1,000 mg by mouth 2 (two) times daily with a meal.      . OSTEO BI-FLEX JOINT SHIELD PO TABS Oral Take 2 tablets by mouth daily.    . ADULT MULTIVITAMIN W/MINERALS CH Oral Take 1 tablet by mouth daily.    Marland Kitchen OMEPRAZOLE 20 MG PO CPDR Oral Take 20 mg by mouth daily.      Marland Kitchen SITAGLIPTIN PHOSPHATE 100  MG PO TABS Oral Take 100 mg by mouth daily.      . TELMISARTAN-HCTZ 80-25 MG PO TABS Oral Take 1 tablet by mouth daily.        BP 137/72  Pulse 69  Temp(Src) 97.9 F (36.6 C) (Oral)  Resp 18  Ht 5\' 11"  (1.803 m)  Wt 190 lb (86.183 kg)  BMI 26.50 kg/m2  SpO2 97%  Physical Exam  Nursing note and vitals reviewed.  71 year old male is resting comfortably in no acute distress. Vital signs are normal. Oxygen saturation is 97% which is normal. Head is normocephalic and atraumatic. PERRLA, EOMI. Conjunctiva are pink. Will fax is clear. Neck is nontender and supple. Back is nontender. Lungs are clear without rales, wheezes, rhonchi. Heart is regular rate and rhythm without murmur. Abdomen is soft, flat, nontender without masses or hepatosplenomegaly. Peristalsis is present. Rectal shows normal sphincter tone and stool is light brown and has been sent to the lab for Hemoccult testing. No gross blood is present. No masses are felt. Extremities have no cyanosis or edema, full range of motion is present. Skin is warm dry without rash. Neurologic: Mental status is normal, cranial nerves are intact, there are no focal motor or sensory deficits.  ED Course  Procedures (including critical care time)  Results for orders placed during the hospital encounter of 06/14/11  CBC      Component Value Range   WBC 7.0  4.0 - 10.5 (K/uL)   RBC 4.14 (*) 4.22 - 5.81 (MIL/uL)   Hemoglobin 12.4 (*) 13.0 - 17.0 (g/dL)   HCT 08.6 (*) 57.8 - 52.0 (%)   MCV 89.4  78.0 - 100.0 (fL)   MCH 30.0  26.0 - 34.0 (pg)   MCHC 33.5  30.0 - 36.0 (g/dL)   RDW 46.9  62.9 - 52.8 (%)   Platelets 259  150 - 400 (K/uL)  DIFFERENTIAL      Component Value Range   Neutrophils Relative 78 (*) 43 - 77 (%)   Neutro Abs 5.4  1.7 - 7.7 (K/uL)   Lymphocytes Relative 14  12 - 46 (%)   Lymphs Abs 1.0  0.7 - 4.0 (K/uL)   Monocytes Relative 6  3 - 12 (%)   Monocytes Absolute 0.4  0.1 - 1.0 (K/uL)   Eosinophils Relative 1  0 - 5 (%)    Eosinophils Absolute 0.1  0.0 - 0.7 (K/uL)   Basophils Relative 0  0 - 1 (%)   Basophils Absolute 0.0  0.0 - 0.1 (K/uL)  POCT I-STAT, CHEM 8      Component Value Range   Sodium 141  135 - 145 (mEq/L)   Potassium 3.9  3.5 - 5.1 (mEq/L)   Chloride 104  96 - 112 (mEq/L)   BUN 17  6 - 23 (mg/dL)   Creatinine, Ser 4.13  0.50 - 1.35 (mg/dL)   Glucose, Bld 244 (*) 70 - 99 (mg/dL)   Calcium, Ion 0.10  2.72 - 1.32 (mmol/L)   TCO2 25  0 - 100 (mmol/L)   Hemoglobin 12.9 (*) 13.0 - 17.0 (g/dL)   HCT 53.6 (*) 64.4 - 52.0 (%)  CBC      Component Value Range   WBC 8.6  4.0 - 10.5 (K/uL)   RBC 4.08 (*) 4.22 - 5.81 (MIL/uL)   Hemoglobin 12.3 (*) 13.0 - 17.0 (g/dL)   HCT 03.4 (*) 74.2 - 52.0 (%)   MCV 89.0  78.0 - 100.0 (fL)   MCH 30.1  26.0 - 34.0 (pg)   MCHC 33.9  30.0 - 36.0 (g/dL)   RDW 59.5  63.8 - 75.6 (%)   Platelets 241  150 - 400 (K/uL)  BASIC METABOLIC PANEL      Component Value Range   Sodium 140  135 - 145 (mEq/L)   Potassium 3.6  3.5 - 5.1 (mEq/L)   Chloride 102  96 - 112 (mEq/L)   CO2 27  19 - 32 (mEq/L)   Glucose, Bld 147 (*) 70 - 99 (mg/dL)   BUN 16  6 - 23 (mg/dL)   Creatinine, Ser 4.33  0.50 - 1.35 (mg/dL)   Calcium 9.3  8.4 - 29.5 (mg/dL)   GFR calc non Af Amer 87 (*) >90 (mL/min)   GFR calc Af Amer >90  >90 (mL/min)  PROTIME-INR      Component Value Range   Prothrombin Time 14.3  11.6 - 15.2 (seconds)   INR 1.09  0.00 - 1.49   APTT      Component Value Range   aPTT 28  24 - 37 (seconds)  TYPE AND SCREEN      Component Value Range   ABO/RH(D) O POS     Antibody Screen NEG     Sample Expiration  06/17/2011    OCCULT BLOOD, POC DEVICE      Component Value Range   Fecal Occult Bld POSITIVE    HEMOGLOBIN AND HEMATOCRIT, BLOOD      Component Value Range   Hemoglobin 11.5 (*) 13.0 - 17.0 (g/dL)   HCT 16.1 (*) 09.6 - 52.0 (%)    1. Rectal bleeding       MDM  Prior records are reviewed and he was admitted about one year ago for hemorrhagic shock due to GI  bleed which was due to a bleeding polyp.  Orthostatic vital signs did not show any significant drop in blood pressure or rise in pulse. Stool is Hemoccult-positive even though it was not gross blood. Hemoglobin has fallen only slightly in the emergency department and remained hemodynamically stable. I have discussed case with Dr. Edrick Oh who recommends followup with Dr. Arlyce Dice next week. I have made an appointment with Dr. Marzetta Board office for 11 at 10 AM.  Dione Booze, MD 06/14/11 1536  Discharge, the patient had another bowel movement with some bright red blood cell was elected to admit him to the hospital for observation. Case is discussed with Dr. Jerral Ralph who agrees to admit the patient.  Dione Booze, MD 06/14/11 [phone]

## 2011-06-14 NOTE — ED Notes (Signed)
Pt went to bathroom to have BM was witnessed to have blood in stool.

## 2011-06-14 NOTE — Discharge Instructions (Signed)
You have an appointment with Dr. Marzetta Board nurse practitioner on Tuesday morning at 10 AM. Return if you have any further bleeding.  Rectal Bleeding Rectal bleeding is when blood passes out of the anus. It is usually a sign that something is wrong. It may not be serious, but it should always be evaluated. Rectal bleeding may present as bright red blood or extremely dark stools. The color may range from dark red or maroon to black (like tar). It is important that the cause of rectal bleeding be identified so treatment can be started and the problem corrected. CAUSES   Hemorrhoids. These are enlarged (dilated) blood vessels or veins in the anal or rectal area.   Fistulas. Theseare abnormal, burrowing channels that usually run from inside the rectum to the skin around the anus. They can bleed.   Anal fissures. This is a tear in the tissue of the anus. Bleeding occurs with bowel movements.   Diverticulosis. This is a condition in which pockets or sacs project from the bowel wall. Occasionally, the sacs can bleed.   Diverticulitis. Thisis an infection involving diverticulosis of the colon.   Proctitis and colitis. These are conditions in which the rectum, colon, or both, can become inflamed and pitted (ulcerated).   Polyps and cancer. Polyps are non-cancerous (benign) growths in the colon that may bleed. Certain types of polyps turn into cancer.   Protrusion of the rectum. Part of the rectum can project from the anus and bleed.   Certain medicines.   Intestinal infections.   Blood vessel abnormalities.  HOME CARE INSTRUCTIONS  Eat a high-fiber diet to keep your stool soft.   Limit activity.   Drink enough fluids to keep your urine clear or pale yellow.   Warm baths may be useful to soothe rectal pain.   Follow up with your caregiver as directed.  SEEK IMMEDIATE MEDICAL CARE IF:  You develop increased bleeding.   You have black or dark red stools.   You vomit blood or material  that looks like coffee grounds.   You have abdominal pain or tenderness.   You have a fever.   You feel weak, nauseous, or you faint.   You have severe rectal pain or you are unable to have a bowel movement.  MAKE SURE YOU:  Understand these instructions.   Will watch your condition.   Will get help right away if you are not doing well or get worse.  Document Released: 06/15/2001 Document Revised: 12/13/2010 Document Reviewed: 06/10/2010 [facility] Patient Information 2012 [place], [place].

## 2011-06-14 NOTE — ED Notes (Signed)
Spoke with Insurance underwriter who states room not assigned due to waiting for room to be cleaned

## 2011-06-14 NOTE — Telephone Encounter (Signed)
Pt seen in the ER today for rectal bleeding. Dr. Preston Fleeting from the ER called to schedule a follow-up OV for the pt. Pt scheduled to see Willette Cluster NP 06/18/11@10am . Dr. Preston Fleeting to inform pt of appt date and time.

## 2011-06-14 NOTE — ED Notes (Signed)
Diet tray ordered for pt 

## 2011-06-14 NOTE — ED Notes (Signed)
Informed rn kim of cbg of 95.

## 2011-06-14 NOTE — H&P (Signed)
PATIENT DETAILS Name: Aaron White Age: 71 y.o. Sex: male Date of Birth: [birth year] Admit Date: 06/14/2011 MWU:XLKG,MWNUUVO, MD, MD   CHIEF COMPLAINT:  Hematochezia  HPI: Patient is a 71 year old white male with a past medical history of a diverticular bleed with hypovolemic shock last year, hypertension, diabetes presented to the hospital with the above noted complaints. Patient was in his usual state of health, this morning around 8:00 he went to the bathroom for a bowel movement and noticed a large amount of blood. He then presented to the ED, he was observed in the ED, gastroenterology was consulted and patient was about to be discharged home with outpatient gastroenterology followup when he had another bloody bowel movement. I was then asked to admit this patient and given patient's prior history of a severe diverticular bleed. Patient did not have any syncopal episodes. He did not have any lightheadedness. He does not have any abdominal pain. He denies any nausea or vomiting. He denies any shortness of breath or chest pains. He is being admitted for further evaluation and treatment.   ALLERGIES:   Allergies  Allergen Reactions  . Penicillins Rash    PAST MEDICAL HISTORY: Past Medical History  Diagnosis Date  . Diabetes mellitus   . Hypertension   . Sleep apnea   . Obstructive sleep apnea on CPAP   . Colon polyp     PAST SURGICAL HISTORY: Past Surgical History  Procedure Date  . Knee surgery     left  . Tonsillectomy     MEDICATIONS AT HOME: Prior to Admission medications   Medication Sig Start Date End Date Taking? Authorizing Provider  aspirin EC 81 MG tablet Take 81 mg by mouth daily.   Yes Historical Provider, MD  metFORMIN (GLUCOPHAGE) 1000 MG tablet Take 1,000 mg by mouth 2 (two) times daily with a meal.     Yes Historical Provider, MD  Misc Natural Products (OSTEO BI-FLEX JOINT SHIELD) TABS Take 2 tablets by mouth daily.   Yes Historical Provider, MD    Multiple Vitamin (MULTIVITAMIN WITH MINERALS) TABS Take 1 tablet by mouth daily.   Yes Historical Provider, MD  omeprazole (PRILOSEC) 20 MG capsule Take 20 mg by mouth daily.     Yes Historical Provider, MD  sitaGLIPtin (JANUVIA) 100 MG tablet Take 100 mg by mouth daily.     Yes Historical Provider, MD  telmisartan-hydrochlorothiazide (MICARDIS HCT) 80-25 MG per tablet Take 1 tablet by mouth daily.     Yes Historical Provider, MD    FAMILY HISTORY: Family history of CAD.  SOCIAL HISTORY:  reports that he has never smoked. He does not have any smokeless tobacco history on file. He reports that he does not drink alcohol or use illicit drugs.  REVIEW OF SYSTEMS:  Constitutional:   No  weight loss, night sweats,  Fevers, chills, fatigue.  HEENT:    No headaches, Difficulty swallowing,Tooth/dental problems,Sore throat,  No sneezing, itching, ear ache, nasal congestion, post nasal drip,   Cardio-vascular: No chest pain,  Orthopnea, PND, swelling in lower extremities, anasarca, dizziness, palpitations  GI:  No heartburn, indigestion, abdominal pain, nausea, vomiting, diarrhea, change in  bowel habits, loss of appetite  Resp: No shortness of breath with exertion or at rest.  No excess mucus, no productive cough, No non-productive cough,  No coughing up of blood.No change in color of mucus.No wheezing.No chest wall deformity  Skin:  no rash or lesions.  GU:  no dysuria, change in color of urine, no  urgency or frequency.  No flank pain.  Musculoskeletal: No joint pain or swelling.  No decreased range of motion.  No back pain.  Psych: No change in mood or affect. No depression or anxiety.  No memory loss.   PHYSICAL EXAM: Blood pressure 142/84, pulse 69, temperature 97.7 F (36.5 C), temperature source Oral, resp. rate 18, height 5\' 11"  (1.803 m), weight 86.183 kg (190 lb), SpO2 99.00%.  General appearance :Awake, alert, not in any distress. Speech Clear. Not toxic  Looking HEENT: Atraumatic and Normocephalic, pupils equally reactive to light and accomodation Neck: supple, no JVD. No cervical lymphadenopathy.  Chest:Good air entry bilaterally, no added sounds  CVS: S1 S2 regular, no murmurs.  Abdomen: Bowel sounds present, Non tender and not distended with no gaurding, rigidity or rebound. Extremities: B/L Lower Ext shows no edema, both legs are warm to touch, with  dorsalis pedis pulses palpable. Neurology: Awake alert, and oriented X 3, CN II-XII intact, Non focal, Deep Tendon Reflex-2+ all over, plantar's downgoing B/L, sensory exam is grossly intact.  Skin:No Rash Wounds:N/A  LABS ON ADMISSION:   Basename 06/14/11 1241 06/14/11 1105  NA 140 141  K 3.6 3.9  CL 102 104  CO2 27 --  GLUCOSE 147* 155*  BUN 16 17  CREATININE 0.81 0.90  CALCIUM 9.3 --  MG -- --  PHOS -- --   No results found for this basename: AST:2,ALT:2,ALKPHOS:2,BILITOT:2,PROT:2,ALBUMIN:2 in the last 72 hours No results found for this basename: LIPASE:2,AMYLASE:2 in the last 72 hours  Basename 06/14/11 1447 06/14/11 1241 06/14/11 1052  WBC -- 8.6 7.0  NEUTROABS -- -- 5.4  HGB 11.5* 12.3* --  HCT 34.0* 36.3* --  MCV -- 89.0 89.4  PLT -- 241 259   No results found for this basename: CKTOTAL:3,CKMB:3,CKMBINDEX:3,TROPONINI:3 in the last 72 hours No results found for this basename: DDIMER:2 in the last 72 hours No components found with this basename: POCBNP:3   RADIOLOGIC STUDIES ON ADMISSION: No results found.  ASSESSMENT AND PLAN: Present on Admission:  .Lower GI bleed -Given prior history, suspect diverticular bleeding  -Admit to telemetry unit for now, minimal drop in hemoglobin. -Type and screen, check H&H every 8 hours for now, monitor clinical course closely. If he were to develop significant lower GI bleed or if he were to have a significant drop in his hemoglobin and hematocrit then we will go didn't transfusion. -For now he is stable, GI consult with  [facility] gastroenterology has been requested. They will see the patient tomorrow morning.  Marland KitchenHTN (hypertension) -Will avoid antihypertensive medications for now. If his GI bleeding were to spontaneously resolve then we will go and then resume his antihypertensives.   .DM (diabetes mellitus) -Will hold metformin and januvia -Place on SSI inpatient   .Anemia -Mild anemia-likely secondary to blood loss -As noted above we will do frequent H&H and transfuse as necessary  Further plan will depend as patient's clinical course evolves and further radiologic and laboratory data become available. Patient will be monitored closely.  DVT Prophylaxis: SCD's  Code Status: Full Code  Total time spent for admission equals 45 minutes.  Jeoffrey Massed 06/14/2011, 4:57 PM Pager [phone]  If 7PM-7AM, please contact night-coverage  [email or web address]  Password [email or web address]

## 2011-06-14 NOTE — ED Notes (Signed)
Patient reports onset of rectal bleeding today.  He has hx of ruptured polyp in past and feels this may be the same.  He reports bright red blood in the toilet

## 2011-06-14 NOTE — ED Notes (Signed)
Patient states that he began passing bright red blood from his rectum this AM.  States that this has happened before so he came directly to the ED.  He denies pain at this time.

## 2011-06-15 DIAGNOSIS — I10 Essential (primary) hypertension: Secondary | ICD-10-CM

## 2011-06-15 DIAGNOSIS — D649 Anemia, unspecified: Secondary | ICD-10-CM

## 2011-06-15 DIAGNOSIS — R112 Nausea with vomiting, unspecified: Secondary | ICD-10-CM

## 2011-06-15 DIAGNOSIS — K922 Gastrointestinal hemorrhage, unspecified: Secondary | ICD-10-CM

## 2011-06-15 DIAGNOSIS — K625 Hemorrhage of anus and rectum: Secondary | ICD-10-CM

## 2011-06-15 DIAGNOSIS — E1165 Type 2 diabetes mellitus with hyperglycemia: Secondary | ICD-10-CM

## 2011-06-15 LAB — CBC
HCT: 32.5 % — ABNORMAL LOW (ref 39.0–52.0)
MCH: 29.6 pg (ref 26.0–34.0)
MCH: 30.5 pg (ref 26.0–34.0)
MCHC: 33.2 g/dL (ref 30.0–36.0)
MCHC: 33.8 g/dL (ref 30.0–36.0)
MCV: 89 fL (ref 78.0–100.0)
Platelets: 222 10*3/uL (ref 150–400)
RDW: 13.3 % (ref 11.5–15.5)
RDW: 13.3 % (ref 11.5–15.5)
WBC: 6 10*3/uL (ref 4.0–10.5)

## 2011-06-15 LAB — COMPREHENSIVE METABOLIC PANEL
AST: 12 U/L (ref 0–37)
Albumin: 3.3 g/dL — ABNORMAL LOW (ref 3.5–5.2)
Calcium: 8.7 mg/dL (ref 8.4–10.5)
Chloride: 104 mEq/L (ref 96–112)
Creatinine, Ser: 0.82 mg/dL (ref 0.50–1.35)
Total Protein: 6.1 g/dL (ref 6.0–8.3)

## 2011-06-15 LAB — GLUCOSE, CAPILLARY: Glucose-Capillary: 145 mg/dL — ABNORMAL HIGH (ref 70–99)

## 2011-06-15 MED ORDER — INSULIN ASPART 100 UNIT/ML ~~LOC~~ SOLN: 0.0000 [IU] | Freq: Every day | SUBCUTANEOUS | Status: DC

## 2011-06-15 MED ORDER — INSULIN ASPART 100 UNIT/ML ~~LOC~~ SOLN
0.0000 [IU] | Freq: Three times a day (TID) | SUBCUTANEOUS | Status: DC
  Administered 2011-06-15 (×2): 1 [IU] via SUBCUTANEOUS
  Administered 2011-06-16: 2 [IU] via SUBCUTANEOUS

## 2011-06-15 MED ORDER — ONDANSETRON HCL 4 MG/2ML IJ SOLN: 4.0000 mg | Freq: Three times a day (TID) | INTRAMUSCULAR | Status: AC | PRN

## 2011-06-15 MED ORDER — INSULIN ASPART 100 UNIT/ML ~~LOC~~ SOLN: 0.0000 [IU] | Freq: Three times a day (TID) | SUBCUTANEOUS | Status: DC

## 2011-06-15 NOTE — Consult Note (Signed)
[facility] Consultation  Referring Provider: Dr. Jerral Ralph Primary Care Physician:  Juline Patch, MD, MD Primary Gastroenterologist:  Dr. Marina Goodell  Reason for Consultation:  Hematochezia  HPI: Aaron White is a 71 y.o. male with PMH of HTN, OSA, GERD, and acute diverticular bleeding in March 2012 who is admitted with hematochezia.  He reports he was feeling well yesterday morning, and then he developed a fairly large volume hematochezia. He presented to the ED and was noted to be stable. There was discussions about discharge home with outpatient GI followup, however he had an additional bloody stool. Therefore the decision was made to admit him to the hospital for observation, given his hemodynamically significant diverticular bleed from 2012. The patient denies abdominal pain. No nausea or vomiting. No fevers or chills. He's had no presyncopal symptoms. No chest pain. No shortness of breath.   Past Medical History  Diagnosis Date  . Diabetes mellitus   . Hypertension   . Sleep apnea   . Obstructive sleep apnea on CPAP   . Colon polyp   . GERD (gastroesophageal reflux disease)   . H/O hiatal hernia     Past Surgical History  Procedure Date  . Knee surgery     left  . Tonsillectomy     Prior to Admission medications   Medication Sig Start Date End Date Taking? Authorizing Provider  aspirin EC 81 MG tablet Take 81 mg by mouth daily.   Yes Historical Provider, MD  metFORMIN (GLUCOPHAGE) 1000 MG tablet Take 1,000 mg by mouth 2 (two) times daily with a meal.     Yes Historical Provider, MD  Misc Natural Products (OSTEO BI-FLEX JOINT SHIELD) TABS Take 2 tablets by mouth daily.   Yes Historical Provider, MD  Multiple Vitamin (MULTIVITAMIN WITH MINERALS) TABS Take 1 tablet by mouth daily.   Yes Historical Provider, MD  omeprazole (PRILOSEC) 20 MG capsule Take 20 mg by mouth daily.     Yes Historical Provider, MD  sitaGLIPtin (JANUVIA) 100 MG tablet Take 100 mg by mouth daily.      Yes Historical Provider, MD  telmisartan-hydrochlorothiazide (MICARDIS HCT) 80-25 MG per tablet Take 1 tablet by mouth daily.     Yes Historical Provider, MD    Current Facility-Administered Medications  Medication Dose Route Frequency Provider Last Rate Last Dose  . 0.9 %  sodium chloride infusion   Intravenous Continuous Maretta Bees, MD 75 mL/hr at 06/14/11 2217    . acetaminophen (TYLENOL) tablet 650 mg  650 mg Oral Q6H PRN Shanker Levora Dredge, MD       Or  . acetaminophen (TYLENOL) suppository 650 mg  650 mg Rectal Q6H PRN Shanker Levora Dredge, MD      . albuterol (PROVENTIL) (5 MG/ML) 0.5% nebulizer solution 2.5 mg  2.5 mg Nebulization Q2H PRN Shanker Levora Dredge, MD      . alum & mag hydroxide-simeth (MAALOX/MYLANTA) 200-200-20 MG/5ML suspension 30 mL  30 mL Oral Q6H PRN Shanker Levora Dredge, MD      . insulin aspart (novoLOG) injection 0-5 Units  0-5 Units Subcutaneous QHS Jinger Neighbors, NP      . insulin aspart (novoLOG) injection 0-9 Units  0-9 Units Subcutaneous TID WC Shanker Levora Dredge, MD      . ondansetron [facility]) injection 4 mg  4 mg Intravenous Q8H PRN Dione Booze, MD      . ondansetron [facility]) tablet 4 mg  4 mg Oral Q6H PRN Shanker Levora Dredge, MD  Or  . ondansetron (ZOFRAN) injection 4 mg  4 mg Intravenous Q6H PRN Maretta Bees, MD      . pantoprazole (PROTONIX) EC tablet 40 mg  40 mg Oral Q1200 Maretta Bees, MD   40 mg at 06/14/11 2217  . sodium chloride 0.9 % injection 3 mL  3 mL Intravenous Q12H Maretta Bees, MD   3 mL at 06/14/11 2217  . DISCONTD: insulin aspart (novoLOG) injection 0-15 Units  0-15 Units Subcutaneous TID WC Jinger Neighbors, NP      . DISCONTD: insulin aspart (novoLOG) injection 0-9 Units  0-9 Units Subcutaneous TID WC Shanker Levora Dredge, MD        Allergies as of 06/14/2011 - Review Complete 06/14/2011  Allergen Reaction Noted  . Penicillins Rash 04/07/2010    FH - CAD  History   Social History  . Marital Status: Married    Spouse  Name: N/A    Number of Children: N/A  . Years of Education: N/A   Social History Main Topics  . Smoking status: Never Smoker   . Smokeless tobacco: Not on file  . Alcohol Use: No  . Drug Use: No  . Sexually Active:     Review of Systems: As per history of present illness, otherwise negative  Physical Exam: Vital signs in last 24 hours: Temp:  [97.3 F (36.3 C)-98.5 F (36.9 C)] 97.3 F (36.3 C) (06/08 0548) Pulse Rate:  [64-87] 87  (06/08 0548) Resp:  [18] 18  (06/08 0548) BP: (124-149)/(65-84) 125/73 mmHg (06/08 0548) SpO2:  [97 %-99 %] 97 % (06/08 0548) Weight:  [190 lb (86.183 kg)-198 lb 6.6 oz (90 kg)] 198 lb 6.6 oz (90 kg) (06/07 2050) Last BM Date: 06/14/11 Gen: awake, alert, NAD HEENT: anicteric, op clear CV: RRR, no mrg Pulm: CTA b/l Abd: soft, NT/ND, +BS throughout Ext: no c/c/e Neuro: nonfocal   Intake/Output from previous day: 06/07 0701 - 06/08 0700 In: 653.8 [P.O.:60; I.V.:593.8] Out: 350 [Urine:350] Intake/Output this shift:    Lab Results:  Basename 06/15/11 0645 06/14/11 2100 06/14/11 1447 06/14/11 1241 06/14/11 1052  WBC 6.0 -- -- 8.6 7.0  HGB 10.8* 11.0* 11.5* -- --  HCT 32.5* 33.3* 34.0* -- --  PLT 222 -- -- 241 259   BMET  Basename 06/14/11 1241 06/14/11 1105  NA 140 141  K 3.6 3.9  CL 102 104  CO2 27 --  GLUCOSE 147* 155*  BUN 16 17  CREATININE 0.81 0.90  CALCIUM 9.3 --   PT/INR  Basename 06/14/11 1241  LABPROT 14.3  INR 1.09    Previous Endoscopies: Upper endoscopy dated 04/08/2010, Dr. Yancey Flemings. Findings, incidental esophageal ring, hiatal hernia otherwise normal endoscopy  Colonoscopy dated 04/09/2010, Dr. Melvia Heaps. Findings, diverticulosis scattered, likely source for GI bleeding. 4 mm sessile polyp in the sigmoid colon removed. Otherwise normal exam.  Impression / Plan: 71 y.o. male with PMH of HTN, OSA, GERD, and acute diverticular bleeding in March 2012 who is admitted with hematochezia.  1. Hematochezia  -- no further bleeding since late yesterday in the ED.  Ate regular dinner last pm in ED, then clears this am.  This is felt very likely to be diverticular in nature, given the clinical presentation and his history.  The recent EGD/colon are reassuring against another source.  For now, recommend observation, and support.   --Will advance diet back to regular. --Follow Hgb --If no further bleeding, I expect he could go home tomorrow  LOS: 1 day   Wilbur Labuda M  06/15/2011, 8:13 AM

## 2011-06-15 NOTE — Progress Notes (Signed)
PATIENT DETAILS Name: Aaron White Age: 71 y.o. Sex: male Date of Birth: 05/17/1940 Admit Date: 06/14/2011 ZOX:WRUE,[id number], MD, MD  Subjective: One black BM today-but no hematochezia  Objective: Vital signs in last 24 hours: Filed Vitals:   06/14/11 2050 06/14/11 2141 06/15/11 0548 06/15/11 1458  BP: 149/77 124/69 125/73 122/66  Pulse: 71 73 87 65  Temp: 97.7 F (36.5 C) 97.7 F (36.5 C) 97.3 F (36.3 C) 97.4 F (36.3 C)  TempSrc: Oral Oral Axillary Oral  Resp: 18 18 18 18   Height: 5\' 11"  (1.803 m)     Weight: 90 kg (198 lb 6.6 oz)     SpO2: 98% 97% 97% 97%    Weight change:   Body mass index is 27.67 kg/(m^2).  Intake/Output from previous day:  Intake/Output Summary (Last 24 hours) at 06/15/11 1616 Last data filed at 06/15/11 1300  Gross per 24 hour  Intake 1013.75 ml  Output   1040 ml  Net -26.25 ml    PHYSICAL EXAM: Gen Exam: Awake and alert with clear speech.   Neck: Supple, No JVD.   Chest: B/L Clear.   CVS: S1 S2 Regular, no murmurs.  Abdomen: soft, BS +, non tender, non distended.  Extremities: no edema, lower extremities warm to touch. Neurologic: Non Focal.   Skin: No Rash.   Wounds: N/A.    CONSULTS:  GI  LAB RESULTS: CBC  Lab 06/15/11 0645 06/14/11 2100 06/14/11 1447 06/14/11 1241 06/14/11 1105 06/14/11 1052  WBC 6.0 -- -- 8.6 -- 7.0  HGB 10.8* 11.0* 11.5* 12.3* 12.9* --  HCT 32.5* 33.3* 34.0* 36.3* 38.0* --  PLT 222 -- -- 241 -- 259  MCV 89.0 -- -- 89.0 -- 89.4  MCH 29.6 -- -- 30.1 -- 30.0  MCHC 33.2 -- -- 33.9 -- 33.5  RDW 13.3 -- -- 13.2 -- 13.3  LYMPHSABS -- -- -- -- -- 1.0  MONOABS -- -- -- -- -- 0.4  EOSABS -- -- -- -- -- 0.1  BASOSABS -- -- -- -- -- 0.0  BANDABS -- -- -- -- -- --    Chemistries   Lab 06/15/11 0645 06/14/11 1241 06/14/11 1105  NA 140 140 141  K 3.7 3.6 3.9  CL 104 102 104  CO2 25 27 --  GLUCOSE 88 147* 155*  BUN 16 16 17   CREATININE 0.82 0.81 0.90  CALCIUM 8.7 9.3 --  MG -- -- --     CBG:  Lab 06/15/11 1211 06/15/11 0816 06/14/11 2146 06/14/11 1747  GLUCAP 138* 145* 212* 95    GFR Estimated Creatinine Clearance: 86.7 ml/min (by C-G formula based on Cr of 0.82).  Coagulation profile  Lab 06/14/11 1241  INR 1.09  PROTIME --    Cardiac Enzymes No results found for this basename: CK:3,CKMB:3,TROPONINI:3,MYOGLOBIN:3 in the last 168 hours  No components found with this basename: POCBNP:3 No results found for this basename: DDIMER:2 in the last 72 hours No results found for this basename: HGBA1C:2 in the last 72 hours No results found for this basename: CHOL:2,HDL:2,LDLCALC:2,TRIG:2,CHOLHDL:2,LDLDIRECT:2 in the last 72 hours No results found for this basename: TSH,T4TOTAL,FREET3,T3FREE,THYROIDAB in the last 72 hours No results found for this basename: VITAMINB12:2,FOLATE:2,FERRITIN:2,TIBC:2,IRON:2,RETICCTPCT:2 in the last 72 hours No results found for this basename: LIPASE:2,AMYLASE:2 in the last 72 hours  Urine Studies No results found for this basename: UACOL:2,UAPR:2,USPG:2,UPH:2,UTP:2,UGL:2,UKET:2,UBIL:2,UHGB:2,UNIT:2,UROB:2,ULEU:2,UEPI:2,UWBC:2,URBC:2,UBAC:2,CAST:2,CRYS:2,UCOM:2,BILUA:2 in the last 72 hours  MICROBIOLOGY: No results found for this or any previous visit (from the past 240 hour(s)).  RADIOLOGY STUDIES/RESULTS: No  results found.  MEDICATIONS: Scheduled Meds:   . insulin aspart  0-5 Units Subcutaneous QHS  . insulin aspart  0-9 Units Subcutaneous TID WC  . pantoprazole  40 mg Oral Q1200  . sodium chloride  3 mL Intravenous Q12H  . DISCONTD: insulin aspart  0-15 Units Subcutaneous TID WC  . DISCONTD: insulin aspart  0-9 Units Subcutaneous TID WC   Continuous Infusions:   . DISCONTD: sodium chloride 75 mL/hr at 06/15/11 1026   PRN Meds:.acetaminophen, acetaminophen, albuterol, alum & mag hydroxide-simeth, ondansetron (ZOFRAN) IV, ondansetron (ZOFRAN) IV, ondansetron  Antibiotics: Anti-infectives    None       Assessment/Plan: Principal Problem: Lower GI bleed  -Given prior history, suspect diverticular bleeding  -seems to have resolved-although-one black stool today-?old blood -Appreciate GI input  Anemia  -Mild anemia-likely secondary to blood loss-Hb down to 10.8 -As noted above we will do frequent H&H and transfuse as necessary  .HTN (hypertension)  -Will avoid antihypertensive medications for now. BP currently controlled without the use of anti-hypertensives  .DM (diabetes mellitus)  -Will hold metformin and januvia  -Continue with SSI   Disposition: Remain inpatient-home hopefully tomorrow if no further bleeding  DVT Prophylaxis: SCD's  Code Status: Full code  Jeoffrey Massed, MD  [facility] Pager [phone]  If 7PM-7AM, please contact night-coverage [email or web address] Password [email or web address] 06/15/2011, 4:16 PM   LOS: 1 day

## 2011-06-16 DIAGNOSIS — I10 Essential (primary) hypertension: Secondary | ICD-10-CM

## 2011-06-16 DIAGNOSIS — K922 Gastrointestinal hemorrhage, unspecified: Secondary | ICD-10-CM

## 2011-06-16 DIAGNOSIS — E1165 Type 2 diabetes mellitus with hyperglycemia: Secondary | ICD-10-CM

## 2011-06-16 DIAGNOSIS — R112 Nausea with vomiting, unspecified: Secondary | ICD-10-CM

## 2011-06-16 LAB — GLUCOSE, CAPILLARY: Glucose-Capillary: 157 mg/dL — ABNORMAL HIGH (ref 70–99)

## 2011-06-16 LAB — CBC
MCHC: 34 g/dL (ref 30.0–36.0)
MCV: 89.4 fL (ref 78.0–100.0)
Platelets: 208 10*3/uL (ref 150–400)
RDW: 13.2 % (ref 11.5–15.5)
WBC: 5.6 10*3/uL (ref 4.0–10.5)

## 2011-06-16 MED ORDER — ASPIRIN EC 81 MG PO TBEC: 81.0000 mg | DELAYED_RELEASE_TABLET | Freq: Every day | ORAL | Status: DC

## 2011-06-16 NOTE — Progress Notes (Signed)
Nsg Discharge Note  Admit Date:  06/14/2011 Discharge date: 06/16/2011   Aaron White to be D/C'd Home per MD order.  AVS completed.  Copy for chart, and copy for patient signed, and dated. Patient/caregiver able to verbalize understanding.  Discharge Medication:  [name], [name]  Home Medication Instructions ZOX:[id number]   Printed on:06/16/11 1116  Medication Information                    metFORMIN (GLUCOPHAGE) 1000 MG tablet Take 1,000 mg by mouth 2 (two) times daily with a meal.             telmisartan-hydrochlorothiazide (MICARDIS HCT) 80-25 MG per tablet Take 1 tablet by mouth daily.             omeprazole (PRILOSEC) 20 MG capsule Take 20 mg by mouth daily.             sitaGLIPtin (JANUVIA) 100 MG tablet Take 100 mg by mouth daily.             Multiple Vitamin (MULTIVITAMIN WITH MINERALS) TABS Take 1 tablet by mouth daily.           Misc Natural Products (OSTEO BI-FLEX JOINT SHIELD) TABS Take 2 tablets by mouth daily.           aspirin EC 81 MG tablet Take 1 tablet (81 mg total) by mouth daily. Stop taking for a week, and if no further bleeding can resume             Discharge Assessment: Filed Vitals:   06/16/11 0531  BP: 132/71  Pulse: 60  Temp: 97.6 F (36.4 C)  Resp: 18   Skin clean, dry and intact without evidence of skin break down, no evidence of skin tears noted. IV catheter discontinued intact. Site without signs and symptoms of complications - no redness or edema noted at insertion site, patient denies c/o pain - only slight tenderness at site.  Dressing with slight pressure applied.  D/c Instructions-Education: Discharge instructions given to patient/family with verbalized understanding. D/c education completed with patient/family including follow up instructions, medication list, d/c activities limitations if indicated, with other d/c instructions as indicated by MD - patient able to verbalize understanding, all questions fully answered. Patient  instructed to return to ED, call 911, or call MD for any changes in condition.  Patient escorted via WC, and D/C home via private auto.  Donette Mainwaring [name], RN 06/16/2011 11:16 AM

## 2011-06-16 NOTE — Progress Notes (Signed)
Patient placed himself on CPAP at 10cm. Will continue to monitor.

## 2011-06-16 NOTE — Discharge Summary (Signed)
PATIENT DETAILS Name: Aaron White Age: 71 y.o. Sex: male Date of Birth: 1940-06-23 MRN: [id number]. Admit Date: 06/14/2011 Admitting Physician: Maretta Bees, MD WUJ:[id number],JYNWGNF, MD, MD  PRIMARY DISCHARGE DIAGNOSIS:  Principal Problem:  *Lower GI bleed-resolved Active Problems:  HTN (hypertension)  DM (diabetes mellitus)  Anemia      PAST MEDICAL HISTORY: Past Medical History  Diagnosis Date  . Diabetes mellitus   . Hypertension   . Sleep apnea   . Obstructive sleep apnea on CPAP   . Colon polyp   . GERD (gastroesophageal reflux disease)   . H/O hiatal hernia     DISCHARGE MEDICATIONS: Medication List  As of 06/16/2011  9:28 AM   TAKE these medications         aspirin EC 81 MG tablet   Take 1 tablet (81 mg total) by mouth daily. Stop taking for a week, and if no further bleeding can resume      metFORMIN 1000 MG tablet   Commonly known as: GLUCOPHAGE   Take 1,000 mg by mouth 2 (two) times daily with a meal.      multivitamin with minerals Tabs   Take 1 tablet by mouth daily.      omeprazole 20 MG capsule   Commonly known as: PRILOSEC   Take 20 mg by mouth daily.      Osteo Bi-Flex Joint Shield Tabs   Take 2 tablets by mouth daily.      sitaGLIPtin 100 MG tablet   Commonly known as: JANUVIA   Take 100 mg by mouth daily.      telmisartan-hydrochlorothiazide 80-25 MG per tablet   Commonly known as: MICARDIS HCT   Take 1 tablet by mouth daily.             BRIEF HPI:  See H&P, Labs, Consult and Test reports for all details in brief, patient was admitted for hematochezia. Given his prior history of hypovolumic shock 2/2 a diverticular bleed last year, he was admitted for further close monitoring  CONSULTATIONS:   GI  PERTINENT RADIOLOGIC STUDIES: No results found.   PERTINENT LAB RESULTS: CBC:  Basename 06/16/11 0706 06/15/11 1908  WBC 5.6 6.8  HGB 10.9* 11.0*  HCT 32.1* 32.5*  PLT 208 221   CMET CMP     Component Value Date/Time     NA 140 06/15/2011 0645   K 3.7 06/15/2011 0645   CL 104 06/15/2011 0645   CO2 25 06/15/2011 0645   GLUCOSE 88 06/15/2011 0645   BUN 16 06/15/2011 0645   CREATININE 0.82 06/15/2011 0645   CALCIUM 8.7 06/15/2011 0645   PROT 6.1 06/15/2011 0645   ALBUMIN 3.3* 06/15/2011 0645   AST 12 06/15/2011 0645   ALT 13 06/15/2011 0645   ALKPHOS 53 06/15/2011 0645   BILITOT 1.0 06/15/2011 0645   GFRNONAA 86* 06/15/2011 0645   GFRAA >90 06/15/2011 0645    GFR Estimated Creatinine Clearance: 86.7 ml/min (by C-G formula based on Cr of 0.82). No results found for this basename: LIPASE:2,AMYLASE:2 in the last 72 hours No results found for this basename: CKTOTAL:3,CKMB:3,CKMBINDEX:3,TROPONINI:3 in the last 72 hours No components found with this basename: POCBNP:3 No results found for this basename: DDIMER:2 in the last 72 hours No results found for this basename: HGBA1C:2 in the last 72 hours No results found for this basename: CHOL:2,HDL:2,LDLCALC:2,TRIG:2,CHOLHDL:2,LDLDIRECT:2 in the last 72 hours No results found for this basename: TSH,T4TOTAL,FREET3,T3FREE,THYROIDAB in the last 72 hours No results found for this basename: VITAMINB12:2,FOLATE:2,FERRITIN:2,TIBC:2,IRON:2,RETICCTPCT:2 in  the last 72 hours Coags:  Basename 06/14/11 1241  INR 1.09   Microbiology: No results found for this or any previous visit (from the past 240 hour(s)).   BRIEF HOSPITAL COURSE:   Principal Problem:  Lower GI bleed  -Given prior history, suspect diverticular bleeding  -seems to have resolved-H/H slight drop from baseline-but otherwise stable- -since just had colonoscopy last year-and suspicion for diverticular bleed-and now resolved-no further w/u recommended at this point.  Anemia  -Mild anemia-likely secondary to blood loss-Hb down to 10.9 -was not transfused PRBC   .HTN (hypertension)  Resume prior anti-hypertensives  .DM (diabetes mellitus)  -Resume metformin and januvia on discharge  TODAY-DAY OF DISCHARGE:  Subjective:    Gaetano Hawthorne today has no headache,no chest abdominal pain,no new weakness tingling or numbness, feels much better wants to go home today. No further hematochezia since admission  Objective:   Blood pressure 132/71, pulse 60, temperature 97.6 F (36.4 C), temperature source Oral, resp. rate 18, height 5\' 11"  (1.803 m), weight 90 kg (198 lb 6.6 oz), SpO2 97.00%.  Intake/Output Summary (Last 24 hours) at 06/16/11 0928 Last data filed at 06/16/11 0600  Gross per 24 hour  Intake    480 ml  Output    500 ml  Net    -20 ml    Exam Awake Alert, Oriented *3, No new F.N deficits, Normal affect Dunn.AT,PERRAL Supple Neck,No JVD, No cervical lymphadenopathy appriciated.  Symmetrical Chest wall movement, Good air movement bilaterally, CTAB RRR,No Gallops,Rubs or new Murmurs, No Parasternal Heave +ve B.Sounds, Abd Soft, Non tender, No organomegaly appriciated, No rebound -guarding or rigidity. No Cyanosis, Clubbing or edema, No new Rash or bruise  DISPOSITION:   DISCHARGE INSTRUCTIONS:    Follow-up Information    Follow up with Melvia Heaps, MD on 06/18/2011. (at 10 AM)    Contact information:   [address] [facility] [address] [place] [address] [place] [address] [phone]       Follow up with Juline Patch, MD. Schedule an appointment as soon as possible for a visit in 1 week.   Contact information:   [address], [address] [place] [address] [phone]          Total Time spent on discharge equals 45 minutes.  SignedJeoffrey Massed 06/16/2011 9:28 AM

## 2011-06-18 ENCOUNTER — Other Ambulatory Visit (INDEPENDENT_AMBULATORY_CARE_PROVIDER_SITE_OTHER): Payer: 59

## 2011-06-18 ENCOUNTER — Ambulatory Visit: Payer: 59 | Admitting: Nurse Practitioner

## 2011-06-18 ENCOUNTER — Encounter: Payer: Self-pay | Admitting: Physician Assistant

## 2011-06-18 ENCOUNTER — Ambulatory Visit (INDEPENDENT_AMBULATORY_CARE_PROVIDER_SITE_OTHER): Payer: 59 | Admitting: Physician Assistant

## 2011-06-18 VITALS — BP 112/64 | HR 68 | Ht 70.0 in | Wt 202.0 lb

## 2011-06-18 DIAGNOSIS — Z8719 Personal history of other diseases of the digestive system: Secondary | ICD-10-CM

## 2011-06-18 DIAGNOSIS — K5790 Diverticulosis of intestine, part unspecified, without perforation or abscess without bleeding: Secondary | ICD-10-CM

## 2011-06-18 DIAGNOSIS — K922 Gastrointestinal hemorrhage, unspecified: Secondary | ICD-10-CM

## 2011-06-18 DIAGNOSIS — D649 Anemia, unspecified: Secondary | ICD-10-CM

## 2011-06-18 DIAGNOSIS — K5730 Diverticulosis of large intestine without perforation or abscess without bleeding: Secondary | ICD-10-CM

## 2011-06-18 LAB — CBC WITH DIFFERENTIAL/PLATELET
Basophils Absolute: 0 10*3/uL (ref 0.0–0.1)
Eosinophils Absolute: 0.1 10*3/uL (ref 0.0–0.7)
HCT: 32.8 % — ABNORMAL LOW (ref 39.0–52.0)
Hemoglobin: 10.9 g/dL — ABNORMAL LOW (ref 13.0–17.0)
Lymphs Abs: 1.4 10*3/uL (ref 0.7–4.0)
MCHC: 33.2 g/dL (ref 30.0–36.0)
MCV: 91.4 fl (ref 78.0–100.0)
Monocytes Absolute: 0.5 10*3/uL (ref 0.1–1.0)
Monocytes Relative: 7.4 % (ref 3.0–12.0)
Neutro Abs: 5.1 10*3/uL (ref 1.4–7.7)
Platelets: 258 10*3/uL (ref 150.0–400.0)
RDW: 14 % (ref 11.5–14.6)

## 2011-06-18 NOTE — Progress Notes (Signed)
Subjective:    Patient ID: Aaron White, male    DOB: May 11, 1940, 71 y.o.   MRN: [id number]  HPI Stormy he is a pleasant 71 year old white male known to Dr. Arlyce Dice with history of diverticular bleeding. He underwent colonoscopy last in April of 2012 and at that time was found to have scattered diverticulosis sigmoid to descending colon and also a 4 mm sessile polyp which was benign. At that time patient was hospitalized with a diverticular bleed and required several units transfusion.  At this time he had onset of recurrent lower GI bleeding on 06/14/2011 and presented to the emergency room. [facility] and was not seen by GI during his admission. He had a stable benign course hemoglobin dropped to 10.9 but he did not require any transfusions and his bleeding stopped within 24 hours of admission. He was discharged on June 9 and returns today here for followup. He says he feels fine has not had any significant weakness, dizziness, lightheadedness etc. He had no associated abdominal pain or cramping. He had one stool yesterday which was still dark looking and this morning had a brown very normal-appearing bowel movement.  He had been on a baby aspirin once daily which is currently on hold, no other blood thinners. His last hemoglobin on 06/16/2011 was 10.9.    Review of Systems  Constitutional: Negative.   HENT: Negative.   Eyes: Negative.   Respiratory: Negative.   Cardiovascular: Negative.   Gastrointestinal: Negative.   Genitourinary: Negative.   Musculoskeletal: Negative.   Neurological: Negative.   Hematological: Negative.   Psychiatric/Behavioral: Negative.    Outpatient Prescriptions Prior to Visit  Medication Sig Dispense Refill  . aspirin EC 81 MG tablet Take 1 tablet (81 mg total) by mouth daily. Stop taking for a week, and if no further bleeding can resume      . metFORMIN (GLUCOPHAGE) 1000 MG tablet Take 1,000 mg by mouth 2 (two)  times daily with a meal.        . Misc Natural Products (OSTEO BI-FLEX JOINT SHIELD) TABS Take 2 tablets by mouth daily.      . Multiple Vitamin (MULTIVITAMIN WITH MINERALS) TABS Take 1 tablet by mouth daily.      Marland Kitchen omeprazole (PRILOSEC) 20 MG capsule Take 20 mg by mouth daily.        . sitaGLIPtin (JANUVIA) 100 MG tablet Take 100 mg by mouth daily.        Marland Kitchen telmisartan-hydrochlorothiazide (MICARDIS HCT) 80-25 MG per tablet Take 1 tablet by mouth daily.         Allergies  Allergen Reactions  . Penicillins Rash   Patient Active Problem List  Diagnoses  . Lower GI bleed  . HTN (hypertension)  . DM (diabetes mellitus)  . Anemia  . Diverticulosis   History   Social History  . Marital Status: Married    Spouse Name: N/A    Number of Children: 3  . Years of Education: N/A   Occupational History  . Teacher [employer]   Social History Main Topics  . Smoking status: Never Smoker   . Smokeless tobacco: Not on file  . Alcohol Use: No  . Drug Use: No  . Sexually Active:    Other Topics Concern  . Not on file   Social History Narrative  . No narrative on file       Objective:   Physical Exam well-developed older white male in no acute distress,  pleasant blood pressure 112 or 64 pulse 68 height 5 foot 10 weight 202. HEENT; nontraumatic normocephalic EOMI PERRLA sclera anicteric conjunctiva pink,Neck; Supple no JVD, Cardiovascular; regular rate and rhythm with S1-S2 no murmur or gallop, Pulmonary; clear bilaterally, Abdomen; soft nontender nondistended bowel sounds are active there is no palpable mass or hepatosplenomegaly. Rectal; not done, Extremities; no clubbing cyanosis or edema skin warm dry, Psych; mood and affect normal and appropriate.        Assessment & Plan:  #58 71 year old white male status post hospitalization for recurrent diverticular bleeding. This episode was mild and he did not require transfusion. He is up-to-date on colonoscopy. #2 normocytic anemia  secondary to above  Plan; patient will stay off of baby aspirin for 2 weeks and then resume Check followup CBC today. Patient due for followup colonoscopy 2019. Follow up with Dr. Arlyce Dice as needed, patient aware should he have any evidence of recurrent bleeding to call and/or procedure the emergency room.

## 2011-06-18 NOTE — Patient Instructions (Addendum)
Please go to the basement level to have your labs drawn.  Resume Aspirin in 2 weeks. You next colonoscopy will be due 04/2017. You will receive a letter a month before the due date.  Call us back at [phone] if symptoms return.

## 2011-06-18 NOTE — Progress Notes (Signed)
Reviewed and agree.

## 2012-12-30 ENCOUNTER — Encounter: Payer: Self-pay | Admitting: Podiatry

## 2012-12-30 ENCOUNTER — Ambulatory Visit (INDEPENDENT_AMBULATORY_CARE_PROVIDER_SITE_OTHER): Payer: 59 | Admitting: Podiatry

## 2012-12-30 VITALS — BP 143/77 | HR 71 | Temp 97.5°F | Resp 18

## 2012-12-30 DIAGNOSIS — B351 Tinea unguium: Secondary | ICD-10-CM

## 2012-12-30 DIAGNOSIS — M79609 Pain in unspecified limb: Secondary | ICD-10-CM

## 2012-12-30 NOTE — Patient Instructions (Signed)
Diabetes and Foot Care Diabetes may cause you to have problems because of poor blood supply (circulation) to your feet and legs. This may cause the skin on your feet to become thinner, break easier, and heal more slowly. Your skin may become dry, and the skin may peel and crack. You may also have nerve damage in your legs and feet causing decreased feeling in them. You may not notice minor injuries to your feet that could lead to infections or more serious problems. Taking care of your feet is one of the most important things you can do for yourself.  HOME CARE INSTRUCTIONS  Wear shoes at all times, even in the house. Do not go barefoot. Bare feet are easily injured.  Check your feet daily for blisters, cuts, and redness. If you cannot see the bottom of your feet, use a mirror or ask someone for help.  Wash your feet with warm water (do not use hot water) and mild soap. Then pat your feet and the areas between your toes until they are completely dry. Do not soak your feet as this can dry your skin.  Apply a moisturizing lotion or petroleum jelly (that does not contain alcohol and is unscented) to the skin on your feet and to dry, brittle toenails. Do not apply lotion between your toes.  Trim your toenails straight across. Do not dig under them or around the cuticle. File the edges of your nails with an emery board or nail file.  Do not cut corns or calluses or try to remove them with medicine.  Wear clean socks or stockings every day. Make sure they are not too tight. Do not wear knee-high stockings since they may decrease blood flow to your legs.  Wear shoes that fit properly and have enough cushioning. To break in new shoes, wear them for just a few hours a day. This prevents you from injuring your feet. Always look in your shoes before you put them on to be sure there are no objects inside.  Do not cross your legs. This may decrease the blood flow to your feet.  If you find a minor scrape,  cut, or break in the skin on your feet, keep it and the skin around it clean and dry. These areas may be cleansed with mild soap and water. Do not cleanse the area with peroxide, alcohol, or iodine.  When you remove an adhesive bandage, be sure not to damage the skin around it.  If you have a wound, look at it several times a day to make sure it is healing.  Do not use heating pads or hot water bottles. They may burn your skin. If you have lost feeling in your feet or legs, you may not know it is happening until it is too late.  Make sure your health care provider performs a complete foot exam at least annually or more often if you have foot problems. Report any cuts, sores, or bruises to your health care provider immediately. SEEK MEDICAL CARE IF:   You have an injury that is not healing.  You have cuts or breaks in the skin.  You have an ingrown nail.  You notice redness on your legs or feet.  You feel burning or tingling in your legs or feet.  You have pain or cramps in your legs and feet.  Your legs or feet are numb.  Your feet always feel cold. SEEK IMMEDIATE MEDICAL CARE IF:   There is increasing redness,   swelling, or pain in or around a wound.  There is a red line that goes up your leg.  Pus is coming from a wound.  You develop a fever or as directed by your health care provider.  You notice a bad smell coming from an ulcer or wound. Document Released: 12/22/1999 Document Revised: 08/26/2012 Document Reviewed: 06/02/2012 Exit[employer] Patient Information 2014 [employer].  

## 2012-12-30 NOTE — Progress Notes (Signed)
   Subjective:    Patient ID: Aaron White, male    DOB: 1940/08/07, 72 y.o.   MRN: [id number]  HPI I have a ingrown toenail on my left big toenail and has been going on for 2 weeks and has been infected and turned red and I wear  a boot on my foot and soaked in epsom salt    Review of Systems  Constitutional: Negative.   HENT: Negative.   Eyes: Negative.   Respiratory: Negative.   Cardiovascular: Negative.   Gastrointestinal: Negative.   Endocrine: Negative.   Musculoskeletal: Negative.   Skin: Negative.   Allergic/Immunologic: Negative.   Neurological: Negative.   Hematological: Bruises/bleeds easily.  Psychiatric/Behavioral: Negative.        Objective:   Physical Exam Orientated x74 72 year old white male  Vascular: The DP and PT pulses are two over four bilaterally  Neurological: Sensation to 10 g monofilament wire intact 9/10 locations bilaterally. Vibratory sensation diminished bilaterally. Knee and ankle reflexes equal and reactive bilaterally.  Dermatological: Hypertrophic, discolored, incurvated toenails on all nail plates including the left hallux. There is no erythema, edema or drainage noted in around the left hallux nail.  Musculoskeletal: On weightbearing bilateral bunions and hammer second and third toes noted bilaterally. The left foot medial longitudinal arch appears lower than the right. The bases of first metatarsocuneiform is prominent on the left foot.       Assessment & Plan:   Assessment: Diabetic peripheral neuropathy bilaterally Symptomatic onychomycoses x10 Bilateral HAV deformities Bilateral hammertoe deformities Flatfoot deformity left greater than right  Plan: All 10 toenails are debrided back without any bleeding. Patient advised to return at three-month intervals or at toenail debridement. Advised patient to wear a sturdy lace up athletic style shoe at all times. He is given general information about diabetic footcare. Reappoint x3  months.

## 2013-03-31 ENCOUNTER — Ambulatory Visit: Payer: 59 | Admitting: Podiatry

## 2013-12-20 ENCOUNTER — Encounter: Payer: Self-pay | Admitting: Podiatry

## 2013-12-20 ENCOUNTER — Ambulatory Visit (INDEPENDENT_AMBULATORY_CARE_PROVIDER_SITE_OTHER): Payer: 59 | Admitting: Podiatry

## 2013-12-20 VITALS — BP 159/82 | HR 86 | Resp 13 | Ht 70.0 in | Wt 215.0 lb

## 2013-12-20 DIAGNOSIS — L84 Corns and callosities: Secondary | ICD-10-CM

## 2013-12-20 NOTE — Patient Instructions (Signed)
Diabetes and Foot Care Diabetes may cause you to have problems because of poor blood supply (circulation) to your feet and legs. This may cause the skin on your feet to become thinner, break easier, and heal more slowly. Your skin may become dry, and the skin may peel and crack. You may also have nerve damage in your legs and feet causing decreased feeling in them. You may not notice minor injuries to your feet that could lead to infections or more serious problems. Taking care of your feet is one of the most important things you can do for yourself.  HOME CARE INSTRUCTIONS  Wear shoes at all times, even in the house. Do not go barefoot. Bare feet are easily injured.  Check your feet daily for blisters, cuts, and redness. If you cannot see the bottom of your feet, use a mirror or ask someone for help.  Wash your feet with warm water (do not use hot water) and mild soap. Then pat your feet and the areas between your toes until they are completely dry. Do not soak your feet as this can dry your skin.  Apply a moisturizing lotion or petroleum jelly (that does not contain alcohol and is unscented) to the skin on your feet and to dry, brittle toenails. Do not apply lotion between your toes.  Trim your toenails straight across. Do not dig under them or around the cuticle. File the edges of your nails with an emery board or nail file.  Do not cut corns or calluses or try to remove them with medicine.  Wear clean socks or stockings every day. Make sure they are not too tight. Do not wear knee-high stockings since they may decrease blood flow to your legs.  Wear shoes that fit properly and have enough cushioning. To break in new shoes, wear them for just a few hours a day. This prevents you from injuring your feet. Always look in your shoes before you put them on to be sure there are no objects inside.  Do not cross your legs. This may decrease the blood flow to your feet.  If you find a minor scrape,  cut, or break in the skin on your feet, keep it and the skin around it clean and dry. These areas may be cleansed with mild soap and water. Do not cleanse the area with peroxide, alcohol, or iodine.  When you remove an adhesive bandage, be sure not to damage the skin around it.  If you have a wound, look at it several times a day to make sure it is healing.  Do not use heating pads or hot water bottles. They may burn your skin. If you have lost feeling in your feet or legs, you may not know it is happening until it is too late.  Make sure your health care provider performs a complete foot exam at least annually or more often if you have foot problems. Report any cuts, sores, or bruises to your health care provider immediately. SEEK MEDICAL CARE IF:   You have an injury that is not healing.  You have cuts or breaks in the skin.  You have an ingrown nail.  You notice redness on your legs or feet.  You feel burning or tingling in your legs or feet.  You have pain or cramps in your legs and feet.  Your legs or feet are numb.  Your feet always feel cold. SEEK IMMEDIATE MEDICAL CARE IF:   There is increasing redness,   swelling, or pain in or around a wound.  There is a red line that goes up your leg.  Pus is coming from a wound.  You develop a fever or as directed by your health care provider.  You notice a bad smell coming from an ulcer or wound. Document Released: 12/22/1999 Document Revised: 08/26/2012 Document Reviewed: 06/02/2012 Exit[employer] Patient Information 2015 [employer]. This information is not intended to replace advice given to you by your health care provider. Make sure you discuss any questions you have with your health care provider.  

## 2013-12-20 NOTE — Progress Notes (Signed)
   Subjective:    Patient ID: Aaron White, male    DOB: 1940-01-14, 73 y.o.   MRN: [id number]  HPI Comments: N corn L left lateral 1st toe D before Thanksgiving 2015 O sudden C hard painful skin A enclosed shoes and diabetic pt T stopped exercise, salt water soaks and medicated corn pads     Review of Systems  All other systems reviewed and are negative.      Objective:   Physical Exam  Orientated 3  Vascular: DP and PT pulses 2/4 bilaterally  Neurological: Ankle reflex equal and reactive bilaterally Sensation to 10 g monofilament wire intact 5/5 right and 4/5 left Vibratory sensation nonreactive left and reactive right  Dermatological: The toenails are elongated brittle, deformed 6-10 Hemorrhagic keratoses medial aspect left hallux  Musculoskeletal: Pes planus bilaterally Hammertoe second right Stable gait pattern      Assessment & Plan:   Satisfactory vascular status Diabetic without vascular and neurological complications Protective sensation intact bilaterally Pre-ulcerative callus left hallux hallux  Plan: Debrided keratoses on the left hallux Dispensed gelcap to wear on left hallux  Reappoint yearly or as needed

## 2013-12-27 ENCOUNTER — Ambulatory Visit: Payer: 59 | Admitting: Podiatry

## 2014-01-05 ENCOUNTER — Encounter (INDEPENDENT_AMBULATORY_CARE_PROVIDER_SITE_OTHER): Payer: BC Managed Care – PPO | Admitting: Ophthalmology

## 2014-01-05 ENCOUNTER — Encounter (INDEPENDENT_AMBULATORY_CARE_PROVIDER_SITE_OTHER): Payer: 59 | Admitting: Ophthalmology

## 2014-01-05 DIAGNOSIS — H35371 Puckering of macula, right eye: Secondary | ICD-10-CM | POA: Diagnosis not present

## 2014-01-05 DIAGNOSIS — E11331 Type 2 diabetes mellitus with moderate nonproliferative diabetic retinopathy with macular edema: Secondary | ICD-10-CM

## 2014-01-05 DIAGNOSIS — H43813 Vitreous degeneration, bilateral: Secondary | ICD-10-CM

## 2014-01-05 DIAGNOSIS — H35033 Hypertensive retinopathy, bilateral: Secondary | ICD-10-CM

## 2014-01-05 DIAGNOSIS — E11311 Type 2 diabetes mellitus with unspecified diabetic retinopathy with macular edema: Secondary | ICD-10-CM

## 2014-01-05 DIAGNOSIS — I10 Essential (primary) hypertension: Secondary | ICD-10-CM | POA: Diagnosis not present

## 2014-02-01 ENCOUNTER — Encounter (INDEPENDENT_AMBULATORY_CARE_PROVIDER_SITE_OTHER): Payer: 59 | Admitting: Ophthalmology

## 2014-02-03 ENCOUNTER — Encounter (INDEPENDENT_AMBULATORY_CARE_PROVIDER_SITE_OTHER): Payer: 59 | Admitting: Ophthalmology

## 2014-02-03 DIAGNOSIS — I10 Essential (primary) hypertension: Secondary | ICD-10-CM | POA: Diagnosis not present

## 2014-02-03 DIAGNOSIS — H35033 Hypertensive retinopathy, bilateral: Secondary | ICD-10-CM

## 2014-02-03 DIAGNOSIS — H43813 Vitreous degeneration, bilateral: Secondary | ICD-10-CM

## 2014-02-03 DIAGNOSIS — E11311 Type 2 diabetes mellitus with unspecified diabetic retinopathy with macular edema: Secondary | ICD-10-CM

## 2014-02-03 DIAGNOSIS — E11321 Type 2 diabetes mellitus with mild nonproliferative diabetic retinopathy with macular edema: Secondary | ICD-10-CM | POA: Diagnosis not present

## 2014-02-03 DIAGNOSIS — H2513 Age-related nuclear cataract, bilateral: Secondary | ICD-10-CM | POA: Diagnosis not present

## 2014-02-04 ENCOUNTER — Encounter (INDEPENDENT_AMBULATORY_CARE_PROVIDER_SITE_OTHER): Payer: BC Managed Care – PPO | Admitting: Ophthalmology

## 2014-03-03 ENCOUNTER — Encounter (INDEPENDENT_AMBULATORY_CARE_PROVIDER_SITE_OTHER): Payer: BC Managed Care – PPO | Admitting: Ophthalmology

## 2014-03-03 DIAGNOSIS — H2513 Age-related nuclear cataract, bilateral: Secondary | ICD-10-CM

## 2014-03-03 DIAGNOSIS — E11311 Type 2 diabetes mellitus with unspecified diabetic retinopathy with macular edema: Secondary | ICD-10-CM | POA: Diagnosis not present

## 2014-03-03 DIAGNOSIS — E11321 Type 2 diabetes mellitus with mild nonproliferative diabetic retinopathy with macular edema: Secondary | ICD-10-CM | POA: Diagnosis not present

## 2014-03-03 DIAGNOSIS — I10 Essential (primary) hypertension: Secondary | ICD-10-CM

## 2014-03-03 DIAGNOSIS — H35033 Hypertensive retinopathy, bilateral: Secondary | ICD-10-CM | POA: Diagnosis not present

## 2014-03-03 DIAGNOSIS — H43813 Vitreous degeneration, bilateral: Secondary | ICD-10-CM

## 2014-04-14 ENCOUNTER — Encounter (INDEPENDENT_AMBULATORY_CARE_PROVIDER_SITE_OTHER): Payer: 59 | Admitting: Ophthalmology

## 2014-04-14 DIAGNOSIS — H2513 Age-related nuclear cataract, bilateral: Secondary | ICD-10-CM

## 2014-04-14 DIAGNOSIS — E11321 Type 2 diabetes mellitus with mild nonproliferative diabetic retinopathy with macular edema: Secondary | ICD-10-CM

## 2014-04-14 DIAGNOSIS — E11311 Type 2 diabetes mellitus with unspecified diabetic retinopathy with macular edema: Secondary | ICD-10-CM

## 2014-04-14 DIAGNOSIS — H35033 Hypertensive retinopathy, bilateral: Secondary | ICD-10-CM | POA: Diagnosis not present

## 2014-04-14 DIAGNOSIS — I10 Essential (primary) hypertension: Secondary | ICD-10-CM

## 2014-04-14 DIAGNOSIS — H43813 Vitreous degeneration, bilateral: Secondary | ICD-10-CM

## 2014-05-26 ENCOUNTER — Encounter (INDEPENDENT_AMBULATORY_CARE_PROVIDER_SITE_OTHER): Payer: 59 | Admitting: Ophthalmology

## 2014-05-26 DIAGNOSIS — I10 Essential (primary) hypertension: Secondary | ICD-10-CM | POA: Diagnosis not present

## 2014-05-26 DIAGNOSIS — H2513 Age-related nuclear cataract, bilateral: Secondary | ICD-10-CM

## 2014-05-26 DIAGNOSIS — E11321 Type 2 diabetes mellitus with mild nonproliferative diabetic retinopathy with macular edema: Secondary | ICD-10-CM | POA: Diagnosis not present

## 2014-05-26 DIAGNOSIS — E11311 Type 2 diabetes mellitus with unspecified diabetic retinopathy with macular edema: Secondary | ICD-10-CM | POA: Diagnosis not present

## 2014-05-26 DIAGNOSIS — H35033 Hypertensive retinopathy, bilateral: Secondary | ICD-10-CM

## 2014-05-26 DIAGNOSIS — H43813 Vitreous degeneration, bilateral: Secondary | ICD-10-CM

## 2014-07-08 ENCOUNTER — Encounter (INDEPENDENT_AMBULATORY_CARE_PROVIDER_SITE_OTHER): Payer: BC Managed Care – PPO | Admitting: Ophthalmology

## 2014-07-08 DIAGNOSIS — I10 Essential (primary) hypertension: Secondary | ICD-10-CM | POA: Diagnosis not present

## 2014-07-08 DIAGNOSIS — E11321 Type 2 diabetes mellitus with mild nonproliferative diabetic retinopathy with macular edema: Secondary | ICD-10-CM | POA: Diagnosis not present

## 2014-07-08 DIAGNOSIS — H2513 Age-related nuclear cataract, bilateral: Secondary | ICD-10-CM | POA: Diagnosis not present

## 2014-07-08 DIAGNOSIS — H35033 Hypertensive retinopathy, bilateral: Secondary | ICD-10-CM

## 2014-07-08 DIAGNOSIS — E11311 Type 2 diabetes mellitus with unspecified diabetic retinopathy with macular edema: Secondary | ICD-10-CM

## 2014-07-08 DIAGNOSIS — H43813 Vitreous degeneration, bilateral: Secondary | ICD-10-CM

## 2014-08-19 ENCOUNTER — Encounter (INDEPENDENT_AMBULATORY_CARE_PROVIDER_SITE_OTHER): Payer: 59 | Admitting: Ophthalmology

## 2014-08-19 DIAGNOSIS — H43813 Vitreous degeneration, bilateral: Secondary | ICD-10-CM

## 2014-08-19 DIAGNOSIS — I10 Essential (primary) hypertension: Secondary | ICD-10-CM | POA: Diagnosis not present

## 2014-08-19 DIAGNOSIS — E11311 Type 2 diabetes mellitus with unspecified diabetic retinopathy with macular edema: Secondary | ICD-10-CM | POA: Diagnosis not present

## 2014-08-19 DIAGNOSIS — H35033 Hypertensive retinopathy, bilateral: Secondary | ICD-10-CM | POA: Diagnosis not present

## 2014-08-19 DIAGNOSIS — E11321 Type 2 diabetes mellitus with mild nonproliferative diabetic retinopathy with macular edema: Secondary | ICD-10-CM | POA: Diagnosis not present

## 2014-09-30 ENCOUNTER — Encounter (INDEPENDENT_AMBULATORY_CARE_PROVIDER_SITE_OTHER): Payer: 59 | Admitting: Ophthalmology

## 2014-09-30 DIAGNOSIS — E11321 Type 2 diabetes mellitus with mild nonproliferative diabetic retinopathy with macular edema: Secondary | ICD-10-CM

## 2014-09-30 DIAGNOSIS — H43813 Vitreous degeneration, bilateral: Secondary | ICD-10-CM | POA: Diagnosis not present

## 2014-09-30 DIAGNOSIS — E11311 Type 2 diabetes mellitus with unspecified diabetic retinopathy with macular edema: Secondary | ICD-10-CM | POA: Diagnosis not present

## 2014-09-30 DIAGNOSIS — I10 Essential (primary) hypertension: Secondary | ICD-10-CM | POA: Diagnosis not present

## 2014-09-30 DIAGNOSIS — H35033 Hypertensive retinopathy, bilateral: Secondary | ICD-10-CM

## 2014-11-11 ENCOUNTER — Encounter (INDEPENDENT_AMBULATORY_CARE_PROVIDER_SITE_OTHER): Payer: 59 | Admitting: Ophthalmology

## 2014-11-11 DIAGNOSIS — H2513 Age-related nuclear cataract, bilateral: Secondary | ICD-10-CM | POA: Diagnosis not present

## 2014-11-11 DIAGNOSIS — E113213 Type 2 diabetes mellitus with mild nonproliferative diabetic retinopathy with macular edema, bilateral: Secondary | ICD-10-CM | POA: Diagnosis not present

## 2014-11-11 DIAGNOSIS — I10 Essential (primary) hypertension: Secondary | ICD-10-CM

## 2014-11-11 DIAGNOSIS — E11311 Type 2 diabetes mellitus with unspecified diabetic retinopathy with macular edema: Secondary | ICD-10-CM | POA: Diagnosis not present

## 2014-11-11 DIAGNOSIS — H43813 Vitreous degeneration, bilateral: Secondary | ICD-10-CM | POA: Diagnosis not present

## 2014-11-11 DIAGNOSIS — H35033 Hypertensive retinopathy, bilateral: Secondary | ICD-10-CM

## 2014-11-24 ENCOUNTER — Emergency Department (HOSPITAL_COMMUNITY)
Admission: EM | Admit: 2014-11-24 | Discharge: 2014-11-24 | Disposition: A | Discharge status: Home / Self Care | Payer: 59 | Attending: Emergency Medicine | Admitting: Emergency Medicine

## 2014-11-24 ENCOUNTER — Encounter (HOSPITAL_COMMUNITY): Payer: Self-pay | Admitting: Emergency Medicine

## 2014-11-24 DIAGNOSIS — Z86010 Personal history of colonic polyps: Secondary | ICD-10-CM | POA: Insufficient documentation

## 2014-11-24 DIAGNOSIS — M1711 Unilateral primary osteoarthritis, right knee: Secondary | ICD-10-CM | POA: Insufficient documentation

## 2014-11-24 DIAGNOSIS — I10 Essential (primary) hypertension: Secondary | ICD-10-CM | POA: Insufficient documentation

## 2014-11-24 DIAGNOSIS — K219 Gastro-esophageal reflux disease without esophagitis: Secondary | ICD-10-CM | POA: Insufficient documentation

## 2014-11-24 DIAGNOSIS — Z9981 Dependence on supplemental oxygen: Secondary | ICD-10-CM | POA: Diagnosis not present

## 2014-11-24 DIAGNOSIS — G4733 Obstructive sleep apnea (adult) (pediatric): Secondary | ICD-10-CM | POA: Diagnosis not present

## 2014-11-24 DIAGNOSIS — Z7984 Long term (current) use of oral hypoglycemic drugs: Secondary | ICD-10-CM | POA: Diagnosis not present

## 2014-11-24 DIAGNOSIS — Z79899 Other long term (current) drug therapy: Secondary | ICD-10-CM | POA: Diagnosis not present

## 2014-11-24 DIAGNOSIS — E119 Type 2 diabetes mellitus without complications: Secondary | ICD-10-CM | POA: Insufficient documentation

## 2014-11-24 DIAGNOSIS — Z7982 Long term (current) use of aspirin: Secondary | ICD-10-CM | POA: Insufficient documentation

## 2014-11-24 DIAGNOSIS — M25561 Pain in right knee: Secondary | ICD-10-CM | POA: Diagnosis present

## 2014-11-24 DIAGNOSIS — Z7951 Long term (current) use of inhaled steroids: Secondary | ICD-10-CM | POA: Insufficient documentation

## 2014-11-24 DIAGNOSIS — Z880 Allergy status to penicillin: Secondary | ICD-10-CM | POA: Diagnosis not present

## 2014-11-24 MED ORDER — TRAMADOL HCL 50 MG PO TABS: 50.0000 mg | ORAL_TABLET | Freq: Four times a day (QID) | ORAL | Status: DC | PRN

## 2014-11-24 MED ORDER — TRAMADOL HCL 50 MG PO TABS
50.0000 mg | ORAL_TABLET | Freq: Once | ORAL | Status: AC
  Administered 2014-11-24: 50 mg via ORAL
  Filled 2014-11-24: qty 1

## 2014-11-24 NOTE — ED Notes (Signed)
Bed: WA08 Expected date:  Expected time:  Means of arrival:  Comments: Bilateral knee pain, 74 yr old

## 2014-11-24 NOTE — ED Notes (Signed)
Patient presents from home via EMS for bilateral knee pain x3 days.   Last VS 160/80, 70hr, 97%ra, 12resp

## 2014-11-24 NOTE — ED Provider Notes (Signed)
CSN: [id number]     Arrival date & time 11/24/14  1746 History   First MD Initiated Contact with Patient 11/24/14 1815     No chief complaint on file.    (Consider location/radiation/quality/duration/timing/severity/associated sxs/prior Treatment) HPI  Aaron White is a 74 y.o. male who presents for evaluation of several weeks of right knee pain, with swelling. His PCP ordered an x-ray, which showed "bone-on-bone". He has pain with any amount of ambulation. No pain or ankle, right side. He denies fever, chills, back pain, weakness or dizziness. There are no other known modifying factors.    Past Medical History  Diagnosis Date  . Diabetes mellitus   . Hypertension   . Sleep apnea   . Obstructive sleep apnea on CPAP   . Colon polyp   . GERD (gastroesophageal reflux disease)   . H/O hiatal hernia   . Diverticulosis    Past Surgical History  Procedure Laterality Date  . Knee surgery      left  . Tonsillectomy     Family History  Problem Relation Age of Onset  . Heart disease Mother   . Stroke Father    Social History  Substance Use Topics  . Smoking status: Never Smoker   . Smokeless tobacco: Never Used  . Alcohol Use: No    Review of Systems  All other systems reviewed and are negative.     Allergies  Penicillins  Home Medications   Prior to Admission medications   Medication Sig Start Date End Date Taking? Authorizing Provider  aspirin EC 81 MG tablet Take 1 tablet (81 mg total) by mouth daily. Stop taking for a week, and if no further bleeding can resume Patient taking differently: Take 81 mg by mouth daily.  06/16/11  Yes Shanker Kristeen Mans, MD  Cholecalciferol (VITAMIN D3) 2000 UNITS capsule Take 2,000 Units by mouth daily.   Yes Historical Provider, MD  Cinnamon 500 MG capsule Take 500 mg by mouth 2 (two) times daily.   Yes Historical Provider, MD  esomeprazole (NEXIUM) 20 MG capsule Take 20 mg by mouth daily at 12 noon.   Yes Historical Provider, MD   fluticasone (FLONASE) 50 MCG/ACT nasal spray USE 1 SPRAY IN EACH NOSTRIL TWICE DAILY. TWICE A DAY NASALLY 90 08/27/14  Yes Historical Provider, MD  metaxalone (SKELAXIN) 800 MG tablet Take 800 mg by mouth daily as needed for muscle spasms.  09/23/14  Yes Historical Provider, MD  metFORMIN (GLUCOPHAGE) 1000 MG tablet Take 1,000 mg by mouth 2 (two) times daily with a meal.     Yes Historical Provider, MD  Misc Natural Products (OSTEO BI-FLEX JOINT SHIELD) TABS Take 1 tablet by mouth 2 (two) times daily.    Yes Historical Provider, MD  Multiple Vitamin (MULTIVITAMIN WITH MINERALS) TABS Take 1 tablet by mouth daily.   Yes Historical Provider, MD  OSENI 25-15 MG TABS Take 1 tablet by mouth daily. 10/24/14  Yes Historical Provider, MD  Specialty Vitamins Products (PROSTATE PO) Take 1 tablet by mouth 2 (two) times daily.   Yes Historical Provider, MD  telmisartan-hydrochlorothiazide (MICARDIS HCT) 80-25 MG per tablet Take 1 tablet by mouth daily.     Yes Historical Provider, MD  vitamin E 200 UNIT capsule Take 200 Units by mouth daily.   Yes Historical Provider, MD  traMADol (ULTRAM) 50 MG tablet Take 1 tablet (50 mg total) by mouth every 6 (six) hours as needed. 11/24/14   Daleen Bo, MD   BP 145/70 mmHg  Pulse 71  Temp(Src) 97.7 F (36.5 C) (Oral)  Resp 18  SpO2 99% Physical Exam  Constitutional: He is oriented to person, place, and time. He appears well-developed and well-nourished.  HENT:  Head: Normocephalic and atraumatic.  Right Ear: External ear normal.  Left Ear: External ear normal.  Eyes: Conjunctivae and EOM are normal. Pupils are equal, round, and reactive to light.  Neck: Normal range of motion and phonation normal. Neck supple.  Cardiovascular: Normal rate.   Pulmonary/Chest: Effort normal. He exhibits no bony tenderness.  Musculoskeletal:  Right knee, tender and swollen without deformity. Small knee effusion. No erythema or warmth over the knee.  Neurological: He is alert and  oriented to person, place, and time. No cranial nerve deficit or sensory deficit. He exhibits normal muscle tone. Coordination normal.  Skin: Skin is warm, dry and intact.  Psychiatric: He has a normal mood and affect. His behavior is normal. Judgment and thought content normal.  Nursing note and vitals reviewed.   ED Course  Procedures (including critical care time)  Medications  traMADol (ULTRAM) tablet 50 mg (not administered)    Patient Vitals for the past 24 hrs:  BP Temp Temp src Pulse Resp SpO2  11/24/14 1809 145/70 mmHg 97.7 F (36.5 C) Oral 71 18 99 %   Knee immobilizer applied per orthopedic technician.  8:07 PM Reevaluation with update and discussion. After initial assessment and treatment, an updated evaluation reveals findings discussed with patient, all questions answered. [place] Review Labs Reviewed - No data to display  Imaging Review No results found. I have personally reviewed and evaluated these images and lab results as part of my medical decision-making.   EKG Interpretation None      MDM   Final diagnoses:  Knee pain, right  Osteoarthritis of right knee, unspecified osteoarthritis type    Knee arthritis with secondary pain. Doubt fracture or septic arthritis.  Nursing Notes Reviewed/ Care Coordinated Applicable Imaging Reviewed Interpretation of Laboratory Data incorporated into ED treatment  The patient appears reasonably screened and/or stabilized for discharge and I doubt any other medical condition or other [facility] requiring further screening, evaluation, or treatment in the ED at this time prior to discharge.  Plan: Home Medications- Tramadol; Home Treatments- rest; return here if the recommended treatment, does not improve the symptoms; Recommended follow up- PCP prn     Daleen Bo, MD 11/24/14 2015

## 2014-12-23 ENCOUNTER — Encounter (INDEPENDENT_AMBULATORY_CARE_PROVIDER_SITE_OTHER): Payer: 59 | Admitting: Ophthalmology

## 2014-12-23 DIAGNOSIS — I10 Essential (primary) hypertension: Secondary | ICD-10-CM

## 2014-12-23 DIAGNOSIS — H35033 Hypertensive retinopathy, bilateral: Secondary | ICD-10-CM

## 2014-12-23 DIAGNOSIS — H43813 Vitreous degeneration, bilateral: Secondary | ICD-10-CM | POA: Diagnosis not present

## 2014-12-23 DIAGNOSIS — E11311 Type 2 diabetes mellitus with unspecified diabetic retinopathy with macular edema: Secondary | ICD-10-CM

## 2014-12-23 DIAGNOSIS — E113291 Type 2 diabetes mellitus with mild nonproliferative diabetic retinopathy without macular edema, right eye: Secondary | ICD-10-CM

## 2014-12-23 DIAGNOSIS — E113312 Type 2 diabetes mellitus with moderate nonproliferative diabetic retinopathy with macular edema, left eye: Secondary | ICD-10-CM

## 2014-12-26 ENCOUNTER — Encounter (HOSPITAL_COMMUNITY): Payer: Self-pay | Admitting: Emergency Medicine

## 2014-12-26 ENCOUNTER — Observation Stay (HOSPITAL_COMMUNITY)
Admission: EM | Admit: 2014-12-26 | Discharge: 2014-12-28 | Disposition: A | Discharge status: Home / Self Care | Payer: 59 | Attending: Internal Medicine | Admitting: Internal Medicine

## 2014-12-26 DIAGNOSIS — M170 Bilateral primary osteoarthritis of knee: Secondary | ICD-10-CM | POA: Insufficient documentation

## 2014-12-26 DIAGNOSIS — Z7982 Long term (current) use of aspirin: Secondary | ICD-10-CM | POA: Insufficient documentation

## 2014-12-26 DIAGNOSIS — K5730 Diverticulosis of large intestine without perforation or abscess without bleeding: Secondary | ICD-10-CM | POA: Insufficient documentation

## 2014-12-26 DIAGNOSIS — G4733 Obstructive sleep apnea (adult) (pediatric): Secondary | ICD-10-CM | POA: Insufficient documentation

## 2014-12-26 DIAGNOSIS — Z7984 Long term (current) use of oral hypoglycemic drugs: Secondary | ICD-10-CM | POA: Insufficient documentation

## 2014-12-26 DIAGNOSIS — R262 Difficulty in walking, not elsewhere classified: Secondary | ICD-10-CM | POA: Insufficient documentation

## 2014-12-26 DIAGNOSIS — E119 Type 2 diabetes mellitus without complications: Secondary | ICD-10-CM | POA: Insufficient documentation

## 2014-12-26 DIAGNOSIS — Z79899 Other long term (current) drug therapy: Secondary | ICD-10-CM | POA: Insufficient documentation

## 2014-12-26 DIAGNOSIS — I10 Essential (primary) hypertension: Secondary | ICD-10-CM | POA: Diagnosis not present

## 2014-12-26 DIAGNOSIS — K219 Gastro-esophageal reflux disease without esophagitis: Secondary | ICD-10-CM

## 2014-12-26 DIAGNOSIS — K922 Gastrointestinal hemorrhage, unspecified: Secondary | ICD-10-CM | POA: Diagnosis not present

## 2014-12-26 DIAGNOSIS — K641 Second degree hemorrhoids: Principal | ICD-10-CM | POA: Insufficient documentation

## 2014-12-26 DIAGNOSIS — M791 Myalgia: Secondary | ICD-10-CM | POA: Diagnosis not present

## 2014-12-26 DIAGNOSIS — D62 Acute posthemorrhagic anemia: Secondary | ICD-10-CM | POA: Insufficient documentation

## 2014-12-26 HISTORY — DX: Unspecified osteoarthritis, unspecified site: M19.90

## 2014-12-26 HISTORY — DX: Type 2 diabetes mellitus without complications: E11.9

## 2014-12-26 HISTORY — DX: Calculus of kidney: N20.0

## 2014-12-26 HISTORY — DX: Obesity, unspecified: E66.9

## 2014-12-26 HISTORY — DX: Gastrointestinal hemorrhage, unspecified: K92.2

## 2014-12-26 HISTORY — DX: Anxiety disorder, unspecified: F41.9

## 2014-12-26 LAB — POC OCCULT BLOOD, ED: FECAL OCCULT BLD: POSITIVE — AB

## 2014-12-26 LAB — COMPREHENSIVE METABOLIC PANEL
ALK PHOS: 63 U/L (ref 38–126)
ALT: 18 U/L (ref 17–63)
AST: 21 U/L (ref 15–41)
Albumin: 4 g/dL (ref 3.5–5.0)
Anion gap: 9 (ref 5–15)
BILIRUBIN TOTAL: 0.9 mg/dL (ref 0.3–1.2)
BUN: 15 mg/dL (ref 6–20)
CALCIUM: 9.6 mg/dL (ref 8.9–10.3)
CO2: 27 mmol/L (ref 22–32)
CREATININE: 1.01 mg/dL (ref 0.61–1.24)
Chloride: 102 mmol/L (ref 101–111)
Glucose, Bld: 259 mg/dL — ABNORMAL HIGH (ref 65–99)
Potassium: 4 mmol/L (ref 3.5–5.1)
Sodium: 138 mmol/L (ref 135–145)
Total Protein: 7.2 g/dL (ref 6.5–8.1)

## 2014-12-26 LAB — CBC
HCT: 37.6 % — ABNORMAL LOW (ref 39.0–52.0)
Hemoglobin: 11.9 g/dL — ABNORMAL LOW (ref 13.0–17.0)
MCH: 28.5 pg (ref 26.0–34.0)
MCHC: 31.6 g/dL (ref 30.0–36.0)
MCV: 90.2 fL (ref 78.0–100.0)
PLATELETS: 272 10*3/uL (ref 150–400)
RBC: 4.17 MIL/uL — AB (ref 4.22–5.81)
RDW: 13.6 % (ref 11.5–15.5)
WBC: 9.6 10*3/uL (ref 4.0–10.5)

## 2014-12-26 LAB — TYPE AND SCREEN
ABO/RH(D): O POS
Antibody Screen: NEGATIVE

## 2014-12-26 LAB — GLUCOSE, CAPILLARY
GLUCOSE-CAPILLARY: 215 mg/dL — AB (ref 65–99)
Glucose-Capillary: 234 mg/dL — ABNORMAL HIGH (ref 65–99)

## 2014-12-26 MED ORDER — METAXALONE 800 MG PO TABS
800.0000 mg | ORAL_TABLET | Freq: Every day | ORAL | Status: DC | PRN
  Filled 2014-12-26: qty 1

## 2014-12-26 MED ORDER — SODIUM CHLORIDE 0.9 % IV BOLUS (SEPSIS)
1000.0000 mL | Freq: Once | INTRAVENOUS | Status: AC
  Administered 2014-12-26: 1000 mL via INTRAVENOUS

## 2014-12-26 MED ORDER — ONDANSETRON HCL 4 MG/2ML IJ SOLN: 4.0000 mg | Freq: Four times a day (QID) | INTRAMUSCULAR | Status: DC | PRN

## 2014-12-26 MED ORDER — SODIUM CHLORIDE 0.45 % IV SOLN
INTRAVENOUS | Status: DC
  Administered 2014-12-26 – 2014-12-27 (×2): via INTRAVENOUS

## 2014-12-26 MED ORDER — ONDANSETRON HCL 4 MG PO TABS: 4.0000 mg | ORAL_TABLET | Freq: Four times a day (QID) | ORAL | Status: DC | PRN

## 2014-12-26 MED ORDER — TRAMADOL HCL 50 MG PO TABS: 50.0000 mg | ORAL_TABLET | Freq: Four times a day (QID) | ORAL | Status: DC | PRN

## 2014-12-26 MED ORDER — IRBESARTAN 300 MG PO TABS
300.0000 mg | ORAL_TABLET | Freq: Every day | ORAL | Status: DC
  Administered 2014-12-27 – 2014-12-28 (×2): 300 mg via ORAL
  Filled 2014-12-26 (×2): qty 1

## 2014-12-26 MED ORDER — HYDROCHLOROTHIAZIDE 25 MG PO TABS
25.0000 mg | ORAL_TABLET | Freq: Every day | ORAL | Status: DC
  Administered 2014-12-27: 25 mg via ORAL
  Filled 2014-12-26: qty 1

## 2014-12-26 MED ORDER — INSULIN ASPART 100 UNIT/ML ~~LOC~~ SOLN
0.0000 [IU] | Freq: Every day | SUBCUTANEOUS | Status: DC
Start: 2014-12-26 — End: 2014-12-28
  Administered 2014-12-26 – 2014-12-27 (×2): 2 [IU] via SUBCUTANEOUS

## 2014-12-26 MED ORDER — TELMISARTAN-HCTZ 80-25 MG PO TABS: 1.0000 | ORAL_TABLET | Freq: Every day | ORAL | Status: DC

## 2014-12-26 MED ORDER — PANTOPRAZOLE SODIUM 40 MG PO TBEC
40.0000 mg | DELAYED_RELEASE_TABLET | Freq: Every day | ORAL | Status: DC
  Administered 2014-12-26 – 2014-12-28 (×3): 40 mg via ORAL
  Filled 2014-12-26 (×3): qty 1

## 2014-12-26 MED ORDER — PANTOPRAZOLE SODIUM 40 MG IV SOLR
40.0000 mg | Freq: Once | INTRAVENOUS | Status: AC
  Administered 2014-12-26: 40 mg via INTRAVENOUS
  Filled 2014-12-26: qty 40

## 2014-12-26 MED ORDER — INSULIN ASPART 100 UNIT/ML ~~LOC~~ SOLN
0.0000 [IU] | Freq: Three times a day (TID) | SUBCUTANEOUS | Status: DC
Start: 2014-12-26 — End: 2014-12-28
  Administered 2014-12-26: 3 [IU] via SUBCUTANEOUS
  Administered 2014-12-27: 2 [IU] via SUBCUTANEOUS
  Administered 2014-12-27: 3 [IU] via SUBCUTANEOUS
  Administered 2014-12-27 – 2014-12-28 (×2): 2 [IU] via SUBCUTANEOUS

## 2014-12-26 NOTE — ED Notes (Signed)
Attempted report x1. 

## 2014-12-26 NOTE — ED Provider Notes (Signed)
CSN: [id number]     Arrival date & time 12/26/14  1027 History   First MD Initiated Contact with Patient 12/26/14 1103     Chief Complaint  Patient presents with  . GI Bleeding   HPI   Aaron White is a 74 y.o. M PMH significant for diverticulosis, DM, HTN presenting with a 1 day history of GI bleed (4 episodes of bloody BM prior to evaluation). He noticed BRB in his stool this morning, unsure of amount but it filled the toilet. No clots. He denies fevers, chills, abdominal pain, chest pain, palpitations, SOB, dizziness, N/V/D. Patient takes 81 mg ASA daily (took this morning).   He has had this in the past and was told it was due to polyps.   Past Medical History  Diagnosis Date  . Diabetes mellitus   . Hypertension   . Sleep apnea   . Obstructive sleep apnea on CPAP   . Colon polyp   . GERD (gastroesophageal reflux disease)   . H/O hiatal hernia   . Diverticulosis    Past Surgical History  Procedure Laterality Date  . Knee surgery      left  . Tonsillectomy     Family History  Problem Relation Age of Onset  . Heart disease Mother   . Stroke Father    Social History  Substance Use Topics  . Smoking status: Never Smoker   . Smokeless tobacco: Never Used  . Alcohol Use: No    Review of Systems  Ten systems are reviewed and are negative for acute change except as noted in the HPI   Allergies  Penicillins  Home Medications   Prior to Admission medications   Medication Sig Start Date End Date Taking? Authorizing Provider  aspirin EC 81 MG tablet Take 1 tablet (81 mg total) by mouth daily. Stop taking for a week, and if no further bleeding can resume Patient taking differently: Take 81 mg by mouth daily.  06/16/11   Shanker Kristeen Mans, MD  Cholecalciferol (VITAMIN D3) 2000 UNITS capsule Take 2,000 Units by mouth daily.    Historical Provider, MD  Cinnamon 500 MG capsule Take 500 mg by mouth 2 (two) times daily.    Historical Provider, MD  esomeprazole (NEXIUM)  20 MG capsule Take 20 mg by mouth daily at 12 noon.    Historical Provider, MD  fluticasone (FLONASE) 50 MCG/ACT nasal spray USE 1 SPRAY IN EACH NOSTRIL TWICE DAILY. TWICE A DAY NASALLY 90 08/27/14   Historical Provider, MD  metaxalone (SKELAXIN) 800 MG tablet Take 800 mg by mouth daily as needed for muscle spasms.  09/23/14   Historical Provider, MD  metFORMIN (GLUCOPHAGE) 1000 MG tablet Take 1,000 mg by mouth 2 (two) times daily with a meal.      Historical Provider, MD  Misc Natural Products (OSTEO BI-FLEX JOINT SHIELD) TABS Take 1 tablet by mouth 2 (two) times daily.     Historical Provider, MD  Multiple Vitamin (MULTIVITAMIN WITH MINERALS) TABS Take 1 tablet by mouth daily.    Historical Provider, MD  OSENI 25-15 MG TABS Take 1 tablet by mouth daily. 10/24/14   Historical Provider, MD  Specialty Vitamins Products (PROSTATE PO) Take 1 tablet by mouth 2 (two) times daily.    Historical Provider, MD  telmisartan-hydrochlorothiazide (MICARDIS HCT) 80-25 MG per tablet Take 1 tablet by mouth daily.      Historical Provider, MD  traMADol (ULTRAM) 50 MG tablet Take 1 tablet (50 mg total) by mouth  every 6 (six) hours as needed. 11/24/14   Daleen Bo, MD  vitamin E 200 UNIT capsule Take 200 Units by mouth daily.    Historical Provider, MD   BP 131/71 mmHg  Pulse 74  Temp(Src) 97 F (36.1 C) (Oral)  Resp 18  SpO2 96% Physical Exam  Constitutional: He appears well-developed and well-nourished. No distress.  HENT:  Head: Normocephalic and atraumatic.  Mouth/Throat: Oropharynx is clear and moist. No oropharyngeal exudate.  Eyes: Conjunctivae are normal. Pupils are equal, round, and reactive to light. Right eye exhibits no discharge. Left eye exhibits no discharge. No scleral icterus.  Neck: No tracheal deviation present.  Cardiovascular: Normal rate, regular rhythm, normal heart sounds and intact distal pulses.  Exam reveals no gallop and no friction rub.   No murmur heard. Pulmonary/Chest: Effort  normal and breath sounds normal. No respiratory distress. He has no wheezes. He has no rales. He exhibits no tenderness.  Abdominal: Soft. Bowel sounds are normal. He exhibits no distension and no mass. There is no tenderness. There is no rebound and no guarding.  Genitourinary:  Pilar Plate BRB per rectum.   Musculoskeletal: He exhibits no edema.  Lymphadenopathy:    He has no cervical adenopathy.  Neurological: He is alert. Coordination normal.  Skin: Skin is warm and dry. No rash noted. He is not diaphoretic. No erythema.  Psychiatric: He has a normal mood and affect. His behavior is normal.  Nursing note and vitals reviewed.   ED Course  Procedures  Labs Review Labs Reviewed  CBC - Abnormal; Notable for the following:    RBC 4.17 (*)    Hemoglobin 11.9 (*)    HCT 37.6 (*)    All other components within normal limits  COMPREHENSIVE METABOLIC PANEL  POC OCCULT BLOOD, ED  TYPE AND SCREEN    MDM   Final diagnoses:  Gastrointestinal hemorrhage, unspecified gastritis, unspecified gastrointestinal hemorrhage type   Patient stable. Will need admit for GI bleed. CMP with hyperglycemia. Hgb stable at 11.9. Fecal occult positive.  Linna Darner agrees to admission and advise GI consult.  Dr. Oletta Lamas with GI will see patient, most likely in the morning. Patient is stable for admission. Patient in agreement and understanding of the plan.    [place], PA-C 12/29/14 1543  Charlesetta Shanks, MD 01/14/15 [phone]

## 2014-12-26 NOTE — Progress Notes (Signed)
Pt. Arrived to the unit via stretcher. Pt. Is alert and oriented with no signs of distress noted. Pt. Vitals appear stable with no skin issues noted. Pt. Ambulated from stretcher to the bed and tolerated well. Educated pt. On use of staff numbers, room telephone and call bell. Call light within reach. Orders released. No further needs noted at this time. 

## 2014-12-26 NOTE — ED Notes (Signed)
Pt noted to have bowel movement upon arrival to the ED, bright red blood noted to toilet no clots noted. Pt alert and oriented, states this happens "every 3-4 years and the doctors dont' know why." states he takes baby aspirin a day no other blood thinners.

## 2014-12-26 NOTE — ED Notes (Signed)
While using restroom, only urine - no loose stool or blood.

## 2014-12-26 NOTE — H&P (Signed)
[facility] History and Physical  Aaron White [id number] DOB: [birth year] DOA: 12/26/2014  Referring physician: PA [place] PCP: Tommy Medal, MD   Chief Complaint: Hematochezia  HPI: Aaron White is a 74 y.o. male  Patient presenting with a single episode of hematochezia. States that he woke up this morning and had his normal bowel movement. After that patient went to the kitchen and had his breakfast. He states that he again felt the need to pass a stool so he went into the bathroom and had explosive bloody diarrhea. This occurred at approximately 09:30. EMS was called and patient was brought to [facility] ED. Patient states that both before and after the episode he feels at baseline and denies any abdominal cramping or discomfort, hematemesis, nausea, vomiting, rash, fevers, dysuria, back pain. Patient states that his last colonoscopy was in 2012 at length our and was told that it was essentially normal with only a small polyp noted. Per review of the pathology report this was a benign polyp. Patient denies any medication changes. Denies any NSAID use. States that he's had a previous episode like this in 2013 for which he was admitted for observation but no further studies were done and he was released the following day after no further bloody bowel movements.    Review of Systems:  Constitutional:  No weight loss, night sweats, Fevers, chills, fatigue.  HEENT:  No headaches, Difficulty swallowing,Tooth/dental problems,Sore throat, Cardio-vascular:  No chest pain, Orthopnea, PND, swelling in lower extremities, anasarca, dizziness, palpitations  GI: Per HPi Resp:   No shortness of breath with exertion or at rest. No excess mucus, no productive cough, No non-productive cough, No coughing up of blood.No change in color of mucus.No wheezing.No chest wall deformity  Skin:  no rash or lesions.  GU:  no dysuria, change in color of urine, no urgency or frequency. No  flank pain.  Musculoskeletal:   No joint pain or swelling. No decreased range of motion. No back pain.  Psych:  No change in mood or affect. No depression or anxiety. No memory loss.  Neuro:  No change in sensation, unilateral strength, or cognitive abilities  All other systems were reviewed and are negative.  Past Medical History  Diagnosis Date  . Diabetes mellitus   . Hypertension   . Sleep apnea   . Obstructive sleep apnea on CPAP   . Colon polyp   . GERD (gastroesophageal reflux disease)   . H/O hiatal hernia   . Diverticulosis    Past Surgical History  Procedure Laterality Date  . Knee surgery      left  . Tonsillectomy     Social History:  reports that he has never smoked. He has never used smokeless tobacco. He reports that he does not drink alcohol or use illicit drugs.  Allergies  Allergen Reactions  . Penicillins Rash    Has patient had a PCN reaction causing immediate rash, facial/tongue/throat swelling, SOB or lightheadedness with hypotension: No Has patient had a PCN reaction causing severe rash involving mucus membranes or skin necrosis: No Has patient had a PCN reaction that required hospitalization No Has patient had a PCN reaction occurring within the last 10 years: No If all of the above answers are "NO", then may proceed with Cephalosporin use.     Family History  Problem Relation Age of Onset  . Heart disease Mother   . Stroke Father      Prior to Admission medications   Medication  Sig Start Date End Date Taking? Authorizing Provider  aspirin EC 81 MG tablet Take 1 tablet (81 mg total) by mouth daily. Stop taking for a week, and if no further bleeding can resume Patient taking differently: Take 81 mg by mouth daily.  06/16/11  Yes Shanker Kristeen Mans, MD  Cholecalciferol (VITAMIN D3) 2000 UNITS capsule Take 2,000 Units by mouth daily.   Yes Historical Provider, MD  Cinnamon 500 MG capsule Take 500 mg by mouth 2 (two) times daily.   Yes Historical  Provider, MD  esomeprazole (NEXIUM) 20 MG capsule Take 20 mg by mouth daily at 12 noon.   Yes Historical Provider, MD  fluticasone (FLONASE) 50 MCG/ACT nasal spray USE 1 SPRAY IN EACH NOSTRIL TWICE DAILY. TWICE A DAY NASALLY 90 08/27/14  Yes Historical Provider, MD  metaxalone (SKELAXIN) 800 MG tablet Take 800 mg by mouth daily as needed for muscle spasms. Reported on 12/26/2014 09/23/14  Yes Historical Provider, MD  metFORMIN (GLUCOPHAGE) 1000 MG tablet Take 1,000 mg by mouth 2 (two) times daily with a meal.     Yes Historical Provider, MD  Misc Natural Products (OSTEO BI-FLEX JOINT SHIELD) TABS Take 1 tablet by mouth 2 (two) times daily.    Yes Historical Provider, MD  Multiple Vitamin (MULTIVITAMIN WITH MINERALS) TABS Take 1 tablet by mouth daily.   Yes Historical Provider, MD  OSENI 25-15 MG TABS Take 1 tablet by mouth daily. 10/24/14  Yes Historical Provider, MD  Specialty Vitamins Products (PROSTATE PO) Take 1 tablet by mouth 2 (two) times daily.   Yes Historical Provider, MD  telmisartan-hydrochlorothiazide (MICARDIS HCT) 80-25 MG per tablet Take 1 tablet by mouth daily.     Yes Historical Provider, MD  traMADol (ULTRAM) 50 MG tablet Take 1 tablet (50 mg total) by mouth every 6 (six) hours as needed. Patient taking differently: Take 50 mg by mouth every 6 (six) hours as needed for moderate pain.  11/24/14  Yes Daleen Bo, MD  vitamin E 200 UNIT capsule Take 200 Units by mouth daily.   Yes Historical Provider, MD   Physical Exam: Filed Vitals:   12/26/14 1415 12/26/14 1500 12/26/14 1530 12/26/14 1615  BP: 136/65 142/76 136/75 117/82  Pulse: 76 79 76 87  Temp:      TempSrc:      Resp: 17 21 16 18   SpO2: 96% 100% 100% 100%    Wt Readings from Last 3 Encounters:  12/20/13 97.523 kg (215 lb)  06/18/11 91.627 kg (202 lb)  06/14/11 90 kg (198 lb 6.6 oz)    General:  Appears calm and comfortable Eyes:  PERRL, EOMI, normal lids, iris ENT:  grossly normal hearing, lips & tongue Neck:   no LAD, masses or thyromegaly Cardiovascular:  RRR, no m/r/g. No LE edema.  Respiratory:  CTA bilaterally, no w/r/r. Normal respiratory effort. Abdomen:  soft, ntnd Skin:  no rash or induration seen on limited exam Musculoskeletal:  grossly normal tone BUE/BLE Psychiatric:  grossly normal mood and affect, speech fluent and appropriate Neurologic:  CN 2-12 grossly intact, moves all extremities in coordinated fashion.          Labs on Admission:  Basic Metabolic Panel:  Recent Labs Lab 12/26/14 1051  NA 138  K 4.0  CL 102  CO2 27  GLUCOSE 259*  BUN 15  CREATININE 1.01  CALCIUM 9.6   Liver Function Tests:  Recent Labs Lab 12/26/14 1051  AST 21  ALT 18  ALKPHOS 63  BILITOT 0.9  PROT 7.2  ALBUMIN 4.0   No results for input(s): LIPASE, AMYLASE in the last 168 hours. No results for input(s): AMMONIA in the last 168 hours. CBC:  Recent Labs Lab 12/26/14 1051  WBC 9.6  HGB 11.9*  HCT 37.6*  MCV 90.2  PLT 272   Cardiac Enzymes: No results for input(s): CKTOTAL, CKMB, CKMBINDEX, TROPONINI in the last 168 hours.  BNP (last 3 results) No results for input(s): BNP in the last 8760 hours.  ProBNP (last 3 results) No results for input(s): PROBNP in the last 8760 hours.   Creatinine clearance cannot be calculated (Unknown ideal weight.)  CBG: No results for input(s): GLUCAP in the last 168 hours.  Radiological Exams on Admission: No results found.   Assessment/Plan Active Problems:   GI bleed   Well controlled diabetes mellitus ([place])   Essential hypertension   GERD without esophagitis   Musculoskeletal pain  GI bleed: Single episode. Hemoglobin stable at 11.9. History of these intermittent brief episodes. Patient is asymptomatic. Discussed case with on-call bowel or GI physician Dr. Silverio Decamp, who is requesting admission for observation. Suspect diverticular bleed that is now resolved. No need for scope unless patient continues to bleed. - MedSurg - CBC  in a.m. - Clear liquid diet, NPO after a night - Formally consult [facility] if bleeding persists for possible scope - Hold ASA 81  HTN: - continue Micardis  DM: on orals only - SSI - A1c  GERD: - continue Nexium  MSK pain: - continue skelaxin, Tramadol   Code Status: FULL  DVT Prophylaxis: SCD Family Communication: none Disposition Plan: Pending Improvement    Adain Geurin Lenna Sciara, MD Family Medicine [facility] [email or web address] Password [email or web address]

## 2014-12-26 NOTE — ED Notes (Signed)
Per EMS: pt from home for eval of bright red blood in stool x3 since this morning, pt denies any abd pain, n/v/d or fevers at this time. States hx of same a couple of years ago and was told it could be "polyps". Pt alert and oriented, nad noted. Ambulatory to bathroom upon arrival to ED.

## 2014-12-26 NOTE — Progress Notes (Signed)
We were asked to see patient the ER for G.I. bleeding. He apparently has seen [facility] several times in the past and they have been contacted by the admitting physician. We therefore will not see the patient formally will be available if needed

## 2014-12-26 NOTE — ED Notes (Signed)
Used restroom - Urine and liquid stool. Aaron White has bright red blood without clots

## 2014-12-26 NOTE — ED Notes (Signed)
Pt ambulate to bathroom with no assistance. Passed urine and liquid stool - bright red blood prominent with no clots.

## 2014-12-26 NOTE — ED Notes (Signed)
Pt. able to ambulate to bathroom. Urinate and liquid stool with bright red blood, no clots.

## 2014-12-27 ENCOUNTER — Encounter (HOSPITAL_COMMUNITY): Payer: Self-pay | Admitting: Physician Assistant

## 2014-12-27 DIAGNOSIS — K5731 Diverticulosis of large intestine without perforation or abscess with bleeding: Secondary | ICD-10-CM | POA: Diagnosis not present

## 2014-12-27 DIAGNOSIS — D62 Acute posthemorrhagic anemia: Secondary | ICD-10-CM

## 2014-12-27 DIAGNOSIS — K921 Melena: Secondary | ICD-10-CM

## 2014-12-27 DIAGNOSIS — I10 Essential (primary) hypertension: Secondary | ICD-10-CM | POA: Diagnosis not present

## 2014-12-27 DIAGNOSIS — E119 Type 2 diabetes mellitus without complications: Secondary | ICD-10-CM | POA: Diagnosis not present

## 2014-12-27 DIAGNOSIS — K5730 Diverticulosis of large intestine without perforation or abscess without bleeding: Secondary | ICD-10-CM | POA: Diagnosis not present

## 2014-12-27 LAB — CBC
HEMATOCRIT: 26.9 % — AB (ref 39.0–52.0)
HEMATOCRIT: 27.8 % — AB (ref 39.0–52.0)
HEMOGLOBIN: 8.7 g/dL — AB (ref 13.0–17.0)
HEMOGLOBIN: 8.9 g/dL — AB (ref 13.0–17.0)
MCH: 29 pg (ref 26.0–34.0)
MCH: 28.5 pg (ref 26.0–34.0)
MCHC: 32.3 g/dL (ref 30.0–36.0)
MCHC: 32 g/dL (ref 30.0–36.0)
MCV: 89.7 fL (ref 78.0–100.0)
MCV: 89.1 fL (ref 78.0–100.0)
Platelets: 224 10*3/uL (ref 150–400)
Platelets: 228 10*3/uL (ref 150–400)
RBC: 3 MIL/uL — AB (ref 4.22–5.81)
RBC: 3.12 MIL/uL — ABNORMAL LOW (ref 4.22–5.81)
RDW: 13.7 % (ref 11.5–15.5)
RDW: 13.7 % (ref 11.5–15.5)
WBC: 5.7 10*3/uL (ref 4.0–10.5)
WBC: 6.2 10*3/uL (ref 4.0–10.5)

## 2014-12-27 LAB — HEMOGLOBIN A1C
HEMOGLOBIN A1C: 7.5 % — AB (ref 4.8–5.6)
Mean Plasma Glucose: 169 mg/dL

## 2014-12-27 LAB — GLUCOSE, CAPILLARY
GLUCOSE-CAPILLARY: 180 mg/dL — AB (ref 65–99)
Glucose-Capillary: 220 mg/dL — ABNORMAL HIGH (ref 65–99)
Glucose-Capillary: 198 mg/dL — ABNORMAL HIGH (ref 65–99)
Glucose-Capillary: 206 mg/dL — ABNORMAL HIGH (ref 65–99)

## 2014-12-27 MED ORDER — SODIUM CHLORIDE 0.9 % IV SOLN
INTRAVENOUS | Status: DC
  Administered 2014-12-27 – 2014-12-28 (×2): via INTRAVENOUS

## 2014-12-27 MED ORDER — POLYETHYLENE GLYCOL 3350 17 GM/SCOOP PO POWD
1.0000 | Freq: Once | ORAL | Status: AC
  Administered 2014-12-27: 255 g via ORAL
  Filled 2014-12-27: qty 255

## 2014-12-27 NOTE — Progress Notes (Signed)
Inpatient Diabetes Program Recommendations  AACE/ADA: New Consensus Statement on Inpatient Glycemic Control (2015)  Target Ranges:  Prepandial:   less than 140 mg/dL      Peak postprandial:   less than 180 mg/dL (1-2 hours)      Critically ill patients:  140 - 180 mg/dL   Review of Glycemic Control  Diabetes history: DM 2 Outpatient Diabetes medications: metformin 1000 bid Current orders for Inpatient glycemic control: Sensitive correction scale tidwc and HS correction scale  Inpatient Diabetes Program Recommendations: Glucose primarily maintaining 200's level. Please consider increase in correction to moderate tidwc and continue HS coverage.     Thank you Rosita Kea, RN, MSN, CDE  Diabetes Inpatient Program Office: [phone] Pager: [phone] 8:00 am to 5:00 pm

## 2014-12-27 NOTE — Progress Notes (Signed)
   PROGRESS NOTE  Aaron White [id number] DOB: [birth year] DOA: 12/26/2014 PCP: Horatio Pel, MD  HPI/Recap of past 24 hours:  hgb dropped, vital stable, patient denies pain, wife in room, per [facility] gi dr Oletta Lamas notes, he recommended [facility] to see this patient, [facility] contacted, continue cycle cbc,   Assessment/Plan: Active Problems:   GI bleed   Well controlled diabetes mellitus ([place])   Essential hypertension   GERD without esophagitis   Musculoskeletal pain  Painless GI bleed: from diverticula bleed? Colonoscopy in am,   Blood loss anemia: monitor hgb, transfuse if hgb less than 8.  Non insulin dependent diabetes; hold metformin, ssi here. a1c7.5  Htn: continue home meds  Code Status: full   Family Communication: patient and wife in room  Disposition Plan: full   Consultants:  gi  Procedures:  Colonoscopy in am on 12/21  Antibiotics:  none   Objective: BP 140/66 mmHg  Pulse 74  Temp(Src) 97.4 F (36.3 C) (Oral)  Resp 18  Ht 5\' 10"  (1.778 m)  Wt 179 lb 11.2 oz (81.511 kg)  BMI 25.78 kg/m2  SpO2 99%  Intake/Output Summary (Last 24 hours) at 12/27/14 0953 Last data filed at 12/26/14 2212  Gross per 24 hour  Intake      0 ml  Output     75 ml  Net    -75 ml   Filed Weights   12/27/14 0802  Weight: 179 lb 11.2 oz (81.511 kg)    Exam:   General:  NAD  Cardiovascular: RRR  Respiratory: CTABL  Abdomen: Soft/ND/NT, positive BS  Musculoskeletal: No Edema  Neuro: no focal deficit  Data Reviewed: Basic Metabolic Panel:  Recent Labs Lab 12/26/14 1051  NA 138  K 4.0  CL 102  CO2 27  GLUCOSE 259*  BUN 15  CREATININE 1.01  CALCIUM 9.6   Liver Function Tests:  Recent Labs Lab 12/26/14 1051  AST 21  ALT 18  ALKPHOS 63  BILITOT 0.9  PROT 7.2  ALBUMIN 4.0   No results for input(s): LIPASE, AMYLASE in the last 168 hours. No results for input(s): AMMONIA in the last 168 hours. CBC:  Recent Labs Lab  12/26/14 1051 12/27/14 0650  WBC 9.6 5.7  HGB 11.9* 8.7*  HCT 37.6* 26.9*  MCV 90.2 89.7  PLT 272 224   Cardiac Enzymes:   No results for input(s): CKTOTAL, CKMB, CKMBINDEX, TROPONINI in the last 168 hours. BNP (last 3 results) No results for input(s): BNP in the last 8760 hours.  ProBNP (last 3 results) No results for input(s): PROBNP in the last 8760 hours.  CBG:  Recent Labs Lab 12/26/14 1715 12/26/14 2127  GLUCAP 215* 234*    No results found for this or any previous visit (from the past 240 hour(s)).   Studies: No results found.  Scheduled Meds: . irbesartan  300 mg Oral Daily   And  . hydrochlorothiazide  25 mg Oral Daily  . insulin aspart  0-5 Units Subcutaneous QHS  . insulin aspart  0-9 Units Subcutaneous TID WC  . pantoprazole  40 mg Oral Daily    Continuous Infusions: . sodium chloride 100 mL/hr at 12/27/14 0516     Time spent: 61mins  Deboraha Goar MD, PhD  [facility] Pager [phone]. If 7PM-7AM, please contact night-coverage at [email or web address], password [email or web address] 12/27/2014, 9:53 AM  LOS: 1 day

## 2014-12-27 NOTE — Care Management Note (Signed)
Case Management Note  Patient Details  Name: Aaron White MRN: [id number] Date of Birth: 1940-03-31  Subjective/Objective:     Date: 12/27/14 Spoke with patient at the bedside along with spoue.  Introduced self as Tourist information centre manager and explained role in discharge planning and how to be reached.  Verified patient lives in town, alone with spouse. Has DME chandicap handles on raised toilet, and shower with bars.  Patient still works and has been very functional pta. Expressed potential need for no other DME.  Verified patient anticipates to go home with family, at time of discharge and will have full-time supervision by family  at this time to best of their knowledge.  Patient denied needing help with their medication.  Patient drives to MD appointments.  Verified patient has PCP Deland Pretty.   Plan: CM will continue to follow for discharge planning and [facility] resources.                Action/Plan:   Expected Discharge Date:                  Expected Discharge Plan:  Home/Self Care  In-House Referral:     Discharge planning Services  CM Consult  Post Acute Care Choice:    Choice offered to:     DME Arranged:    DME Agency:     HH Arranged:    HH Agency:     Status of Service:  In process, will continue to follow  Medicare Important Message Given:    Date Medicare IM Given:    Medicare IM give by:    Date Additional Medicare IM Given:    Additional Medicare Important Message give by:     If discussed at [place] of Stay Meetings, dates discussed:    Additional Comments:  Zenon Mayo, RN 12/27/2014, 2:23 PM

## 2014-12-27 NOTE — Consult Note (Signed)
[facility] Consult: 10:22 AM 12/27/2014  LOS: 1 day    Referring Provider: Dr Erlinda Hong  Primary Care Physician:  Horatio Pel, MD Primary Gastroenterologist:  Dr. Deatra Ina vs Deatra Ina.    Reason for Consultation:  GI bleed. Painless hematochezia.    HPI: Aaron White is a 74 y.o. male.  Type 2 diabetic, on oral agents.  OSA on CPAP.  Obesity.  Left inguinal hernia. Osteopenia. Osteoarthritis.  Hx diverticular bleeds in 2012, 2013.  Required PRBCs in 2012 for blood loss anemia.  2008 adenomatous colon polyps.   Takes 81 MG ASA daily. No NSAIDs  04/2010 colonoscopy: scattered diverticulosis, descending to sigmoid.  Small amount old blood. Benign 4 mm sessile polyp (path: mucosal prolapse polyp).  Hematochezia attributed to diverticular bleed,  04/2010 EGD: distal esophageal ring, 3 cm HH.  08/2006 Colonoscopy.  Dr Lajoyce Corners. 1-2 cm flat cecal polyp (pathology: adenomatous without HGD).  07/2006 EGD by Dr Lajoyce Corners.  Marked esophagitis. Pathology: esophageal biopsy: chronic inflammation with granulation tissues.  No metaplasia, dysplasia, or  malignancy.  07/2006 Esophagram for dysphagia: moderately large HH with GERD.  Distal esoph stricture, 13 mm tablet did not pass the stricture.   Painless hematochezia started in AM of 12/19.  Had normal brown stool initially that AM but an hour or so later the explosive , large volume bloody stool erupted.  Had only one of these at home, had another after arrival to the emergency room. This morning a single stool is mostly black with a little bit of fresh blood but volume is much smaller. At no point has he had abdominal pain, nausea, dizziness, chest pain, palpitations. Other than the bleeding, he feels fine. He does have bilateral knee osteoarthritis and is looking at bilateral knee  replacements within the next several months. He controls the discomfort with when necessary Tylenol. He doesn't drink alcohol. He was transfused with the diverticular bleed in 2012 but has not been transfused since then.  His appetite is good.  He has been following his diabetic diet and has managed to lose about 25 pounds in the last year. He continues to use CPAP at night for OSA.   Past Medical History  Diagnosis Date  . Hypertension   . Colon polyp 2008, 2012    adenomatous 2008.  mucosal prolapse polyp 04/2010  . GERD (gastroesophageal reflux disease)   . H/O hiatal hernia 2008  . Diverticulosis 2012  . Kidney stones   . Type II diabetes mellitus ([place])   . Obstructive sleep apnea on CPAP   . Arthritis     "knees" (12/26/2014). osteopenia per CT.   Marland Kitchen Anxiety   . Lower GI bleed 2012, 2013, 12/2014.     diverticular bleeding  . Obesity 2015    Past Surgical History  Procedure Laterality Date  . Knee cartilage surgery Left   . Tonsillectomy and adenoidectomy    . Lithotripsy      Prior to Admission medications   Medication Sig Start Date End Date Taking? Authorizing Provider  aspirin EC 81 MG tablet Take  1 tablet (81 mg total) by mouth daily. Stop taking for a week, and if no further bleeding can resume Patient taking differently: Take 81 mg by mouth daily.  06/16/11  Yes Shanker Kristeen Mans, MD  Cholecalciferol (VITAMIN D3) 2000 UNITS capsule Take 2,000 Units by mouth daily.   Yes Historical Provider, MD  Cinnamon 500 MG capsule Take 500 mg by mouth 2 (two) times daily.   Yes Historical Provider, MD  esomeprazole (NEXIUM) 20 MG capsule Take 20 mg by mouth daily at 12 noon.   Yes Historical Provider, MD  fluticasone (FLONASE) 50 MCG/ACT nasal spray USE 1 SPRAY IN EACH NOSTRIL TWICE DAILY. TWICE A DAY NASALLY 90 08/27/14  Yes Historical Provider, MD  metaxalone (SKELAXIN) 800 MG tablet Take 800 mg by mouth daily as needed for muscle spasms. Reported on 12/26/2014 09/23/14  Yes  Historical Provider, MD  metFORMIN (GLUCOPHAGE) 1000 MG tablet Take 1,000 mg by mouth 2 (two) times daily with a meal.     Yes Historical Provider, MD  Misc Natural Products (OSTEO BI-FLEX JOINT SHIELD) TABS Take 1 tablet by mouth 2 (two) times daily.    Yes Historical Provider, MD  Multiple Vitamin (MULTIVITAMIN WITH MINERALS) TABS Take 1 tablet by mouth daily.   Yes Historical Provider, MD  OSENI 25-15 MG TABS Take 1 tablet by mouth daily. 10/24/14  Yes Historical Provider, MD  Specialty Vitamins Products (PROSTATE PO) Take 1 tablet by mouth 2 (two) times daily.   Yes Historical Provider, MD  telmisartan-hydrochlorothiazide (MICARDIS HCT) 80-25 MG per tablet Take 1 tablet by mouth daily.     Yes Historical Provider, MD  traMADol (ULTRAM) 50 MG tablet Take 1 tablet (50 mg total) by mouth every 6 (six) hours as needed. Patient taking differently: Take 50 mg by mouth every 6 (six) hours as needed for moderate pain.  11/24/14  Yes Daleen Bo, MD  vitamin E 200 UNIT capsule Take 200 Units by mouth daily.   Yes Historical Provider, MD    Scheduled Meds: . irbesartan  300 mg Oral Daily   And  . hydrochlorothiazide  25 mg Oral Daily  . insulin aspart  0-5 Units Subcutaneous QHS  . insulin aspart  0-9 Units Subcutaneous TID WC  . pantoprazole  40 mg Oral Daily   Infusions: . sodium chloride 100 mL/hr at 12/27/14 0516   PRN Meds: metaxalone, ondansetron **OR** ondansetron (ZOFRAN) IV, traMADol   Allergies as of 12/26/2014 - Review Complete 12/26/2014  Allergen Reaction Noted  . Penicillins Rash 04/07/2010    Family History  Problem Relation Age of Onset  . Heart disease Mother   . Stroke Father     Social History   Social History  . Marital Status: Married    Spouse Name: N/A  . Number of Children: 3  . Years of Education: N/A   Occupational History  . Teacher [employer]   Social History Main Topics  . Smoking status: Never Smoker   . Smokeless tobacco: Never Used    . Alcohol Use: No  . Drug Use: No  . Sexual Activity: Not Currently   Other Topics Concern  . Not on file   Social History Narrative    REVIEW OF SYSTEMS: Constitutional:  Weight drop of 25# in the last 12 months.  No fatigue. Because of his arthritic knees, he finds it difficult to exercise. ENT:  No nose bleeds Pulm:  No dyspnea on exertion, no cough. CV:  No palpitations, no LE edema.  GU:  No hematuria, no frequency GI:  No dysphagia. See HPI Heme:  No unusual bleeding or bruising other than this recent hematochezia. Has not been taking iron or B12 supplements.   Transfusions:  Per HPI Neuro:  No headaches, no peripheral tingling or numbness Derm:  No itching, no rash or sores.  Endocrine:  No sweats or chills.  No polyuria or dysuria Immunization:  Did not inquire.   PHYSICAL EXAM: Vital signs in last 24 hours: Filed Vitals:   12/26/14 2128 12/27/14 0517  BP: 134/62 140/66  Pulse: 92 74  Temp: 97.6 F (36.4 C) 97.4 F (36.3 C)  Resp: 18    Wt Readings from Last 3 Encounters:  12/27/14 81.511 kg (179 lb 11.2 oz)  12/20/13 97.523 kg (215 lb)  06/18/11 91.627 kg (202 lb)    General: Pleasant, alert, looks well. Comfortable. Head:  No swelling, no facial asymmetry. No signs of head trauma.  Eyes:  No scleral icterus. No conjunctival pallor. EOMI. Ears:  Not hard of hearing.  Nose:  No discharge or congestion Mouth:  Moist, clear oral mucosa. Dentition in good repair. Neck:  No JVD, no masses. No TMG. Lungs:  Clear bilaterally. No labored breathing. No cough. Heart: RRR. No MRG. S1/S2 audible. Abdomen:  Soft. NT. ND. No masses. No hernias. Bowel sounds active..   Rectal: Very deep burgundy stool consistent with old or blood. No masses.   Musc/Skeltl: No joint erythema or contracture deformities. Some arthritic changes in the knees bilaterally. Extremities:  No CCE.  Neurologic:  Pleasant. Alert. Oriented 3. No limb weakness. No tremors or gross neurologic  deficits. Skin:  No telangiectasia, rashes or sores. No suspicious hyperpigmented lesions. Tattoos:  None observed. Nodes:  No cervical or inguinal adenopathy.   Psych:  Pleasant, calm, cooperative.  Intake/Output from previous day: 12/19 0701 - 12/20 0700 In: -  Out: 75 [Urine:75] Intake/Output this shift:    LAB RESULTS:  Recent Labs  12/26/14 1051 12/27/14 0650  WBC 9.6 5.7  HGB 11.9* 8.7*  HCT 37.6* 26.9*  PLT 272 224   BMET Lab Results  Component Value Date   NA 138 12/26/2014   NA 140 06/15/2011   NA 140 06/14/2011   K 4.0 12/26/2014   K 3.7 06/15/2011   K 3.6 06/14/2011   CL 102 12/26/2014   CL 104 06/15/2011   CL 102 06/14/2011   CO2 27 12/26/2014   CO2 25 06/15/2011   CO2 27 06/14/2011   GLUCOSE 259* 12/26/2014   GLUCOSE 88 06/15/2011   GLUCOSE 147* 06/14/2011   BUN 15 12/26/2014   BUN 16 06/15/2011   BUN 16 06/14/2011   CREATININE 1.01 12/26/2014   CREATININE 0.82 06/15/2011   CREATININE 0.81 06/14/2011   CALCIUM 9.6 12/26/2014   CALCIUM 8.7 06/15/2011   CALCIUM 9.3 06/14/2011   LFT  Recent Labs  12/26/14 1051  PROT 7.2  ALBUMIN 4.0  AST 21  ALT 18  ALKPHOS 63  BILITOT 0.9   PT/INR Lab Results  Component Value Date   INR 1.09 06/14/2011   INR 1.09 04/07/2010    Drugs of Abuse  No results found for: LABOPIA, COCAINSCRNUR, LABBENZ, AMPHETMU, THCU, LABBARB   RADIOLOGY STUDIES: No results found.  ENDOSCOPIC STUDIES: Per HPI.   IMPRESSION:   *  painless hematochezia.  Likely recurrent diverticular bleed.   *  ABL anemia.    *   DM type 2.    *  GERD.  Controlled with  Nexium at home.   *  OSA. On CPAP nightly.    PLAN:     *   Colonoscopy at 11:30 AM tomorrow. Begin clears. Split dose prep.  *  B.i.d. CBCs.   Azucena Freed  12/27/2014, 10:22 AM Pager: [phone]      Attending physician's note   I have taken a history, examined the patient and reviewed the chart. I agree with the Advanced Practitioner's  note, impression and recommendations. Hematochezia likely secondary to diverticular bleed. Monitor Hgb and transfuse as needed to maintain Hgb>7. Will plan for colonoscopy tomorrow AM.  The risks and benefits as well as alternatives of endoscopic procedure(s) have been discussed and reviewed. All questions answered. The patient agrees to proceed.   Damaris Hippo, MD [phone] Mon-Fri 8a-5p [phone] after 5p, weekends, holidays

## 2014-12-27 NOTE — Progress Notes (Signed)
Condom cath placed.

## 2014-12-27 NOTE — Progress Notes (Signed)
Pt do go lightly tonight and unable to wear CPAP tonight

## 2014-12-27 NOTE — Care Management Obs Status (Signed)
[place] NOTIFICATION   Patient Details  Name: Aaron White MRN: [id number] Date of Birth: [birth year]   Medicare Observation Status Notification Given:  Yes    Zenon Mayo, RN 12/27/2014, 3:07 PM

## 2014-12-28 ENCOUNTER — Observation Stay (HOSPITAL_COMMUNITY): Payer: 59 | Admitting: Anesthesiology

## 2014-12-28 ENCOUNTER — Encounter (HOSPITAL_COMMUNITY): Payer: Self-pay | Admitting: Gastroenterology

## 2014-12-28 ENCOUNTER — Encounter (HOSPITAL_COMMUNITY)
Admission: EM | Disposition: A | Discharge status: Home / Self Care | Payer: Self-pay | Source: Home / Self Care | Attending: Internal Medicine

## 2014-12-28 DIAGNOSIS — I10 Essential (primary) hypertension: Secondary | ICD-10-CM | POA: Diagnosis not present

## 2014-12-28 DIAGNOSIS — K921 Melena: Secondary | ICD-10-CM | POA: Diagnosis not present

## 2014-12-28 DIAGNOSIS — K649 Unspecified hemorrhoids: Secondary | ICD-10-CM | POA: Diagnosis not present

## 2014-12-28 DIAGNOSIS — K5730 Diverticulosis of large intestine without perforation or abscess without bleeding: Secondary | ICD-10-CM | POA: Diagnosis not present

## 2014-12-28 HISTORY — PX: COLONOSCOPY: SHX5424

## 2014-12-28 LAB — BASIC METABOLIC PANEL
ANION GAP: 8 (ref 5–15)
BUN: 8 mg/dL (ref 6–20)
CALCIUM: 9 mg/dL (ref 8.9–10.3)
CHLORIDE: 106 mmol/L (ref 101–111)
CO2: 25 mmol/L (ref 22–32)
CREATININE: 0.88 mg/dL (ref 0.61–1.24)
GFR calc non Af Amer: >60 mL/min (ref >60)
Glucose, Bld: 267 mg/dL — ABNORMAL HIGH (ref 65–99)
Potassium: 3.3 mmol/L — ABNORMAL LOW (ref 3.5–5.1)
SODIUM: 139 mmol/L (ref 135–145)

## 2014-12-28 LAB — CBC
HCT: 28.8 % — ABNORMAL LOW (ref 39.0–52.0)
HEMATOCRIT: 27.8 % — AB (ref 39.0–52.0)
HEMOGLOBIN: 9.1 g/dL — AB (ref 13.0–17.0)
Hemoglobin: 9.5 g/dL — ABNORMAL LOW (ref 13.0–17.0)
MCH: 29.4 pg (ref 26.0–34.0)
MCH: 29.6 pg (ref 26.0–34.0)
MCHC: 32.7 g/dL (ref 30.0–36.0)
MCHC: 33 g/dL (ref 30.0–36.0)
MCV: 89.7 fL (ref 78.0–100.0)
MCV: 89.7 fL (ref 78.0–100.0)
PLATELETS: 255 10*3/uL (ref 150–400)
Platelets: 223 10*3/uL (ref 150–400)
RBC: 3.1 MIL/uL — ABNORMAL LOW (ref 4.22–5.81)
RBC: 3.21 MIL/uL — AB (ref 4.22–5.81)
RDW: 13.8 % (ref 11.5–15.5)
RDW: 13.6 % (ref 11.5–15.5)
WBC: 6.9 10*3/uL (ref 4.0–10.5)
WBC: 6.1 10*3/uL (ref 4.0–10.5)

## 2014-12-28 LAB — MAGNESIUM: MAGNESIUM: 1.7 mg/dL (ref 1.7–2.4)

## 2014-12-28 LAB — GLUCOSE, CAPILLARY
GLUCOSE-CAPILLARY: 196 mg/dL — AB (ref 65–99)
GLUCOSE-CAPILLARY: 155 mg/dL — AB (ref 65–99)

## 2014-12-28 SURGERY — COLONOSCOPY
Anesthesia: Monitor Anesthesia Care

## 2014-12-28 MED ORDER — PROPOFOL 500 MG/50ML IV EMUL
INTRAVENOUS | Status: DC | PRN
  Administered 2014-12-28: 50 ug/kg/min via INTRAVENOUS

## 2014-12-28 MED ORDER — PROPOFOL 10 MG/ML IV BOLUS
INTRAVENOUS | Status: DC | PRN
  Administered 2014-12-28: 10 mg via INTRAVENOUS

## 2014-12-28 MED ORDER — HYDROCORTISONE 2.5 % RE CREA: TOPICAL_CREAM | Freq: Two times a day (BID) | RECTAL | Status: AC

## 2014-12-28 MED ORDER — HYDROCORTISONE 2.5 % RE CREA
TOPICAL_CREAM | Freq: Two times a day (BID) | RECTAL | Status: DC
  Administered 2014-12-28: 15:00:00 via RECTAL
  Filled 2014-12-28: qty 28.35

## 2014-12-28 MED ORDER — LACTATED RINGERS IV SOLN
INTRAVENOUS | Status: DC
  Administered 2014-12-28 (×2): via INTRAVENOUS
  Administered 2014-12-28: 1000 mL via INTRAVENOUS

## 2014-12-28 NOTE — Anesthesia Preprocedure Evaluation (Signed)
Anesthesia Evaluation  Patient identified by MRN, date of birth, ID band Patient awake    Reviewed: Allergy & Precautions, NPO status , Patient's Chart, lab work & pertinent test results  Airway Mallampati: I  TM Distance: >3 FB Neck ROM: Full    Dental   Pulmonary sleep apnea and Continuous Positive Airway Pressure Ventilation ,    Pulmonary exam normal        Cardiovascular hypertension, Pt. on medications Normal cardiovascular exam     Neuro/Psych Anxiety    GI/Hepatic   Endo/Other  diabetes, Type 2, Oral Hypoglycemic Agents  Renal/GU      Musculoskeletal   Abdominal   Peds  Hematology   Anesthesia Other Findings   Reproductive/Obstetrics                             Anesthesia Physical Anesthesia Plan  ASA: II  Anesthesia Plan: MAC   Post-op Pain Management:    Induction: Intravenous  Airway Management Planned: Simple Face Mask  Additional Equipment:   Intra-op Plan:   Post-operative Plan:   Informed Consent: I have reviewed the patients History and Physical, chart, labs and discussed the procedure including the risks, benefits and alternatives for the proposed anesthesia with the patient or authorized representative who has indicated his/her understanding and acceptance.     Plan Discussed with: CRNA and Surgeon  Anesthesia Plan Comments:         Anesthesia Quick Evaluation

## 2014-12-28 NOTE — Discharge Summary (Signed)
Physician Discharge Summary  Aaron White [id number] DOB: 1940/01/28 DOA: 12/26/2014  PCP: Horatio Pel, MD  Admit date: 12/26/2014 Discharge date: 12/28/2014  Time spent: 35 minutes  Recommendations for Outpatient Follow-up:  1. Mr. Aaron White admitted for hematochezia likely reflecting hemorrhoidal bleed. 2. Please follow-up on CBC on hospital follow-up visit, he had hemoglobin of 9.5 on day of discharge  Discharge Diagnoses:  Active Problems:   GI bleed   Well controlled diabetes mellitus ([place])   Essential hypertension   GERD without esophagitis   Musculoskeletal pain   Hematochezia   Diverticulosis of colon without hemorrhage  Discharge Condition: Stable  Diet recommendation: Regular diet  Filed Weights   12/27/14 0802 12/28/14 1106  Weight: 81.511 kg (179 lb 11.2 oz) 81.194 kg (179 lb)    History of present illness:  Aaron White is a 74 y.o. male  Patient presenting with a single episode of hematochezia. States that he woke up this morning and had his normal bowel movement. After that patient went to the kitchen and had his breakfast. He states that he again felt the need to pass a stool so he went into the bathroom and had explosive bloody diarrhea. This occurred at approximately 09:30. EMS was called and patient was brought to [facility] ED. Patient states that both before and after the episode he feels at baseline and denies any abdominal cramping or discomfort, hematemesis, nausea, vomiting, rash, fevers, dysuria, back pain. Patient states that his last colonoscopy was in 2012 at length our and was told that it was essentially normal with only a small polyp noted. Per review of the pathology report this was a benign polyp. Patient denies any medication changes. Denies any NSAID use. States that he's had a previous episode like this in 2013 for which he was admitted for observation but no further studies were done and he was released the following day after  no further bloody bowel movements.  Hospital Course:  Mr. Aaron White is a pleasant 19-year-old gentleman who was admitted to the medicine service on 12/26/2014 when he presented with complaints of bright red blood per rectum. He denied associated abdominal pain, nausea, vomiting, hematemesis, fevers, chills. Initial lab work revealed a hemoglobin of 11.9 which trended down to 8.7 on the following morning. GI was consulted. He underwent a colonoscopy on 12/28/2014 that did not reveal evidence of active bleeding. GI reporting small internal grade 2 hemorrhoids. Hematochezia likely secondary to hemorrhoidal bleed. Labs on a.m. of 12/28/2014 revealing hemoglobin of 9.5. Given clinical stability, he was discharged to his home in stable condition on this date.   Procedures:  Colonoscopy impression: No evidence of active bleeding or blood clots in the colon; likely etiology of hematochezia is hemorrhoidal hemorrhage left-sided diverticulosis  Consultations:  GI  Discharge Exam: Filed Vitals:   12/28/14 1240 12/28/14 1331  BP: 135/70 124/60  Pulse: 104 72  Temp:    Resp: 11 19    General: No acute distress, awake and alert Cardiovascular: Regular rate and rhythm normal S1-S2 Respiratory: Normal respiratory effort Abdomen: Soft nontender nondistended positive bowel sounds  Discharge Instructions   Discharge Instructions    Call MD for:  difficulty breathing, headache or visual disturbances    Complete by:  As directed      Call MD for:  extreme fatigue    Complete by:  As directed      Call MD for:  hives    Complete by:  As directed  Call MD for:  persistant dizziness or light-headedness    Complete by:  As directed      Call MD for:  persistant nausea and vomiting    Complete by:  As directed      Call MD for:  redness, tenderness, or signs of infection (pain, swelling, redness, odor or green/yellow discharge around incision site)    Complete by:  As directed      Call MD for:   severe uncontrolled pain    Complete by:  As directed      Call MD for:  temperature >100.4    Complete by:  As directed      Call MD for:    Complete by:  As directed      Diet - low sodium heart healthy    Complete by:  As directed      Increase activity slowly    Complete by:  As directed           Current Discharge Medication List    START taking these medications   Details  hydrocortisone (ANUSOL-HC) 2.5 % rectal cream Place rectally 2 (two) times daily. Qty: 30 g, Refills: 0      CONTINUE these medications which have NOT CHANGED   Details  aspirin EC 81 MG tablet Take 1 tablet (81 mg total) by mouth daily. Stop taking for a week, and if no further bleeding can resume    Cholecalciferol (VITAMIN D3) 2000 UNITS capsule Take 2,000 Units by mouth daily.    Cinnamon 500 MG capsule Take 500 mg by mouth 2 (two) times daily.    esomeprazole (NEXIUM) 20 MG capsule Take 20 mg by mouth daily at 12 noon.    fluticasone (FLONASE) 50 MCG/ACT nasal spray USE 1 SPRAY IN EACH NOSTRIL TWICE DAILY. TWICE A DAY NASALLY 90 Refills: 4    metaxalone (SKELAXIN) 800 MG tablet Take 800 mg by mouth daily as needed for muscle spasms. Reported on 12/26/2014 Refills: 2    metFORMIN (GLUCOPHAGE) 1000 MG tablet Take 1,000 mg by mouth 2 (two) times daily with a meal.      Misc Natural Products (OSTEO BI-FLEX JOINT SHIELD) TABS Take 1 tablet by mouth 2 (two) times daily.     Multiple Vitamin (MULTIVITAMIN WITH MINERALS) TABS Take 1 tablet by mouth daily.    OSENI 25-15 MG TABS Take 1 tablet by mouth daily. Refills: 10    Specialty Vitamins Products (PROSTATE PO) Take 1 tablet by mouth 2 (two) times daily.    telmisartan-hydrochlorothiazide (MICARDIS HCT) 80-25 MG per tablet Take 1 tablet by mouth daily.      traMADol (ULTRAM) 50 MG tablet Take 1 tablet (50 mg total) by mouth every 6 (six) hours as needed. Qty: 30 tablet, Refills: 0    vitamin E 200 UNIT capsule Take 200 Units by mouth daily.        Allergies  Allergen Reactions  . Penicillins Rash    Has patient had a PCN reaction causing immediate rash, facial/tongue/throat swelling, SOB or lightheadedness with hypotension: No Has patient had a PCN reaction causing severe rash involving mucus membranes or skin necrosis: No Has patient had a PCN reaction that required hospitalization No Has patient had a PCN reaction occurring within the last 10 years: No If all of the above answers are "NO", then may proceed with Cephalosporin use.    Follow-up Information    Follow up with Horatio Pel, MD In 2 weeks.   Specialty:  Internal  Medicine   Contact information:   [address] [place] [id number] [phone]        The results of significant diagnostics from this hospitalization (including imaging, microbiology, ancillary and laboratory) are listed below for reference.    Significant Diagnostic Studies: No results found.  Microbiology: No results found for this or any previous visit (from the past 240 hour(s)).   Labs: Basic Metabolic Panel:  Recent Labs Lab 12/26/14 1051 12/28/14 0545  NA 138 139  K 4.0 3.3*  CL 102 106  CO2 27 25  GLUCOSE 259* 267*  BUN 15 8  CREATININE 1.01 0.88  CALCIUM 9.6 9.0  MG  --  1.7   Liver Function Tests:  Recent Labs Lab 12/26/14 1051  AST 21  ALT 18  ALKPHOS 63  BILITOT 0.9  PROT 7.2  ALBUMIN 4.0   No results for input(s): LIPASE, AMYLASE in the last 168 hours. No results for input(s): AMMONIA in the last 168 hours. CBC:  Recent Labs Lab 12/26/14 1051 12/27/14 0650 12/27/14 2000 12/28/14 0545 12/28/14 0939  WBC 9.6 5.7 6.2 6.9 6.1  HGB 11.9* 8.7* 8.9* 9.1* 9.5*  HCT 37.6* 26.9* 27.8* 27.8* 28.8*  MCV 90.2 89.7 89.1 89.7 89.7  PLT 272 224 228 223 255   Cardiac Enzymes: No results for input(s): CKTOTAL, CKMB, CKMBINDEX, TROPONINI in the last 168 hours. BNP: BNP (last 3 results) No results for input(s): BNP in the last  8760 hours.  ProBNP (last 3 results) No results for input(s): PROBNP in the last 8760 hours.  CBG:  Recent Labs Lab 12/27/14 1141 12/27/14 1648 12/27/14 2127 12/28/14 0803 12/28/14 1328  GLUCAP 220* 198* 206* 196* 155*       Signed:  Kelvin Cellar MD  FACP  [facility] 12/28/2014, 2:11 PM

## 2014-12-28 NOTE — Evaluation (Signed)
Physical Therapy Evaluation Patient Details Name: Aaron White MRN: [id number] DOB: 12-19-1940 Today's Date: 12/28/2014   History of Present Illness  74 yo male with onset of GI bleed referred to PT wtih OA pain B knees.  MD diagnosed bleed as hemorrhoidal bleed.  Clinical Impression  Pt was able to walk with PT but has some definite alterations in base of gait and fluidity that could create a fall risk [place] [employer] [place].  He is using walking sticks but will be benefited by going to PT and will be using a pool as well for aquatic therapy.    Follow Up Recommendations Outpatient PT    Equipment Recommendations  None recommended by PT    Recommendations for Other Services       Precautions / Restrictions Precautions Precautions: Fall Restrictions Weight Bearing Restrictions: No      Mobility  Bed Mobility Overal bed mobility: Modified Independent             General bed mobility comments: using HOB elevation to sit up bedside  Transfers Overall transfer level: Modified independent Equipment used: Rolling walker (2 wheeled);1 person hand held assist             General transfer comment: Pt needed reminders for hand placement for standing  Ambulation/Gait Ambulation/Gait assistance: Min guard Ambulation Distance (Feet): 200 Feet Assistive device: 1 person hand held assist (guarding only) Gait Pattern/deviations: Step-through pattern Gait velocity: reduced Gait velocity interpretation: Below normal speed for age/gender General Gait Details: Flexed knees with wide base, managing his OA pain with slower pace and widened base.  Stairs            Wheelchair Mobility    Modified Rankin (Stroke Patients Only)       Balance Overall balance assessment: Needs assistance Sitting-balance support: Feet supported Sitting balance-Leahy Scale: Good     Standing balance support: No upper extremity supported Standing balance-Leahy Scale:  Fair Standing balance comment: able to gain initial balance control bedside with no assistance                             Pertinent Vitals/Pain Pain Assessment: Faces Faces Pain Scale: Hurts little more Pain Location: B knees Pain Descriptors / Indicators: Aching Pain Intervention(s): Limited activity within patient's tolerance;Monitored during session;Repositioned    Home Living Family/patient expects to be discharged to:: Private residence Living Arrangements: Spouse/significant other Available Help at Discharge: Family;Available 24 hours/day Type of Home: House Home Access: Stairs to enter   [employer] of Steps: 3 Home Layout: One level Home Equipment: Shower seat (B walking sticks)      Prior Function Level of Independence: Independent with assistive device(s)               Hand Dominance   Dominant Hand: Right    Extremity/Trunk Assessment   Upper Extremity Assessment: Overall WFL for tasks assessed           Lower Extremity Assessment:  (joint pain but not weakness)      Cervical / Trunk Assessment: Normal  Communication   Communication: No difficulties  Cognition Arousal/Alertness: Awake/alert Behavior During Therapy: WFL for tasks assessed/performed Overall Cognitive Status: Within Functional Limits for tasks assessed                      General Comments General comments (skin integrity, edema, etc.): Pt was able to walk with no AD but  uses B walking sticks at home and work (teaching at [employer]).      Exercises        Assessment/Plan    PT Assessment Patient needs continued PT services  PT Diagnosis Difficulty walking;Acute pain   PT Problem List Decreased range of motion;Decreased activity tolerance;Decreased balance;Decreased mobility;Decreased coordination;Decreased safety awareness;Pain;Decreased skin integrity  PT Treatment Interventions DME instruction;Gait training;Functional mobility  training;Therapeutic activities;Therapeutic exercise;Balance training;Neuromuscular re-education;Patient/family education   PT Goals (Current goals can be found in the Care Plan section) Acute Rehab PT Goals Patient Stated Goal: to get TKA's done in Summer 2017. PT Goal Formulation: With patient/family Time For Goal Achievement: 01/11/15 Potential to Achieve Goals: Good    Frequency Min 2X/week   Barriers to discharge Other (comment) (has poor tolerance for gait and leaving for out of the state) leaving and will hinder getting therapy sessions    Co-evaluation               End of Session Equipment Utilized During Treatment: Gait belt Activity Tolerance: Patient tolerated treatment well;Patient limited by pain Patient left: in bed;with call bell/phone within reach;with family/visitor present (had wife in to discuss follow up therapy) Nurse Communication: Mobility status    Functional Assessment Tool Used: clincal judgment Functional Limitation: Mobility: Walking and moving around Mobility: Walking and Moving Around Current Status [id number]): At least 1 percent but less than 20 percent impaired, limited or restricted Mobility: Walking and Moving Around Goal Status [phone]): At least 1 percent but less than 20 percent impaired, limited or restricted Mobility: Walking and Moving Around Discharge Status [phone]): At least 1 percent but less than 20 percent impaired, limited or restricted    Time: 1335-1350 PT Time Calculation (min) (ACUTE ONLY): 15 min   Charges:   PT Evaluation $Initial PT Evaluation Tier I: 1 Procedure     PT G Codes:   PT G-Codes **NOT FOR INPATIENT CLASS** Functional Assessment Tool Used: clincal judgment Functional Limitation: Mobility: Walking and moving around Mobility: Walking and Moving Around Current Status [id number]): At least 1 percent but less than 20 percent impaired, limited or restricted Mobility: Walking and Moving Around Goal Status [phone]): At  least 1 percent but less than 20 percent impaired, limited or restricted Mobility: Walking and Moving Around Discharge Status [phone]): At least 1 percent but less than 20 percent impaired, limited or restricted    Ramond Dial 12/28/2014, 6:10 PM   Mee Hives, PT MS Acute Rehab Dept. Number: [facility] [id number] and [place] [phone]

## 2014-12-28 NOTE — Progress Notes (Signed)
ZD/c'd to home w/ wife via w/c voices no C/O

## 2014-12-28 NOTE — Anesthesia Postprocedure Evaluation (Signed)
Anesthesia Post Note  Patient: [name]  Procedure(s) Performed: Procedure(s) (LRB): COLONOSCOPY (N/A)  Patient location during evaluation: PACU Anesthesia Type: MAC Level of consciousness: awake and alert Pain management: pain level controlled Vital Signs Assessment: post-procedure vital signs reviewed and stable Respiratory status: spontaneous breathing, nonlabored ventilation, respiratory function stable and patient connected to nasal cannula oxygen Cardiovascular status: stable and blood pressure returned to baseline Anesthetic complications: no    Last Vitals:  Filed Vitals:   12/28/14 1106 12/28/14 1230  BP: 138/61   Pulse: 73 69  Temp: 36.6 C   Resp: 17 14    Last Pain:  Filed Vitals:   12/28/14 1230  PainSc: 0-No pain                 Jenella Craigie DAVID

## 2014-12-28 NOTE — Op Note (Signed)
[facility] [place], [id number]   COLONOSCOPY PROCEDURE REPORT  PATIENT: Aaron White, Aaron White  MR#: [id number] BIRTHDATE: 28-Dec-1940 , 74  yrs. old GENDER: male ENDOSCOPIST: Harl Bowie, MD REFERRED [id number] Hospitalists PROCEDURE DATE:  12/28/2014 PROCEDURE:   Colonoscopy, diagnostic First Screening Colonoscopy - Avg.  risk and is 50 yrs.  old or older - No.  Prior Negative Screening - Now for repeat screening. N/A  History of Adenoma - Now for follow-up colonoscopy & has been > or = to 3 yrs.  N/A  Polyps removed today? No ASA CLASS:   Class III INDICATIONS:Evaluation of unexplained GI bleeding and Colorectal Neoplasm Risk Assessment for this procedure is average risk. MEDICATIONS: Monitored anesthesia care  DESCRIPTION OF PROCEDURE:   After the risks benefits and alternatives of the procedure were thoroughly explained, informed consent was obtained.  The digital rectal exam revealed no abnormalities of the rectum.   The Pentax Adult Colon [id number] endoscope was introduced through the anus and advanced to the terminal ileum which was intubated for a short distance. No adverse events experienced.   The quality of the prep was fair.  The instrument was then slowly withdrawn as the colon was fully examined. Estimated blood loss is zero unless otherwise noted in this procedure report.      COLON FINDINGS: The examined terminal ileum appeared to be normal. There was mild diverticulosis noted in the left colon. No blood clots or active bleeding noted in the colon   Small internal Grade II hemorrhoids were found.  Retroflexed views revealed internal hemorrhoids. The time to cecum = 0.2 Withdrawal time = 20.6   The scope was withdrawn and the procedure completed. COMPLICATIONS: There were no immediate complications.  ENDOSCOPIC IMPRESSION: No evidence of active bleeding or blood clots in the colon; likely etiology of hematochezia is  hemorrhoidal hemorrhage left-sided diverticulosis  RECOMMENDATIONS: Anusol cream twice daily per rectum for 7 days okay to discharge home can resume diet  eSigned:  Harl Bowie, MD 12/28/2014 12:33 PM

## 2014-12-28 NOTE — Plan of Care (Signed)
Problem: Acute Rehab PT Goals(only PT should resolve) Goal: Pt Will Transfer Bed To Chair/Chair To Bed With no increased pain Goal: Pt Will Perform Standing Balance Or Pre-Gait With no increased pain Goal: Pt Will Ambulate With no increased pain

## 2014-12-28 NOTE — H&P (View-Only) (Signed)
[facility] Consult: 10:22 AM 12/27/2014  LOS: 1 day    Referring Provider: Dr Erlinda Hong  Primary Care Physician:  Horatio Pel, MD Primary Gastroenterologist:  Dr. Deatra Ina vs Deatra Ina.    Reason for Consultation:  GI bleed. Painless hematochezia.    HPI: Aaron White is a 74 y.o. male.  Type 2 diabetic, on oral agents.  OSA on CPAP.  Obesity.  Left inguinal hernia. Osteopenia. Osteoarthritis.  Hx diverticular bleeds in 2012, 2013.  Required PRBCs in 2012 for blood loss anemia.  2008 adenomatous colon polyps.   Takes 81 MG ASA daily. No NSAIDs  04/2010 colonoscopy: scattered diverticulosis, descending to sigmoid.  Small amount old blood. Benign 4 mm sessile polyp (path: mucosal prolapse polyp).  Hematochezia attributed to diverticular bleed,  04/2010 EGD: distal esophageal ring, 3 cm HH.  08/2006 Colonoscopy.  Dr Lajoyce Corners. 1-2 cm flat cecal polyp (pathology: adenomatous without HGD).  07/2006 EGD by Dr Lajoyce Corners.  Marked esophagitis. Pathology: esophageal biopsy: chronic inflammation with granulation tissues.  No metaplasia, dysplasia, or  malignancy.  07/2006 Esophagram for dysphagia: moderately large HH with GERD.  Distal esoph stricture, 13 mm tablet did not pass the stricture.   Painless hematochezia started in AM of 12/19.  Had normal brown stool initially that AM but an hour or so later the explosive , large volume bloody stool erupted.  Had only one of these at home, had another after arrival to the emergency room. This morning a single stool is mostly black with a little bit of fresh blood but volume is much smaller. At no point has he had abdominal pain, nausea, dizziness, chest pain, palpitations. Other than the bleeding, he feels fine. He does have bilateral knee osteoarthritis and is looking at bilateral knee  replacements within the next several months. He controls the discomfort with when necessary Tylenol. He doesn't drink alcohol. He was transfused with the diverticular bleed in 2012 but has not been transfused since then.  His appetite is good.  He has been following his diabetic diet and has managed to lose about 25 pounds in the last year. He continues to use CPAP at night for OSA.   Past Medical History  Diagnosis Date  . Hypertension   . Colon polyp 2008, 2012    adenomatous 2008.  mucosal prolapse polyp 04/2010  . GERD (gastroesophageal reflux disease)   . H/O hiatal hernia 2008  . Diverticulosis 2012  . Kidney stones   . Type II diabetes mellitus ([place])   . Obstructive sleep apnea on CPAP   . Arthritis     "knees" (12/26/2014). osteopenia per CT.   Marland Kitchen Anxiety   . Lower GI bleed 2012, 2013, 12/2014.     diverticular bleeding  . Obesity 2015    Past Surgical History  Procedure Laterality Date  . Knee cartilage surgery Left   . Tonsillectomy and adenoidectomy    . Lithotripsy      Prior to Admission medications   Medication Sig Start Date End Date Taking? Authorizing Provider  aspirin EC 81 MG tablet Take  1 tablet (81 mg total) by mouth daily. Stop taking for a week, and if no further bleeding can resume Patient taking differently: Take 81 mg by mouth daily.  06/16/11  Yes Shanker Kristeen Mans, MD  Cholecalciferol (VITAMIN D3) 2000 UNITS capsule Take 2,000 Units by mouth daily.   Yes Historical Provider, MD  Cinnamon 500 MG capsule Take 500 mg by mouth 2 (two) times daily.   Yes Historical Provider, MD  esomeprazole (NEXIUM) 20 MG capsule Take 20 mg by mouth daily at 12 noon.   Yes Historical Provider, MD  fluticasone (FLONASE) 50 MCG/ACT nasal spray USE 1 SPRAY IN EACH NOSTRIL TWICE DAILY. TWICE A DAY NASALLY 90 08/27/14  Yes Historical Provider, MD  metaxalone (SKELAXIN) 800 MG tablet Take 800 mg by mouth daily as needed for muscle spasms. Reported on 12/26/2014 09/23/14  Yes  Historical Provider, MD  metFORMIN (GLUCOPHAGE) 1000 MG tablet Take 1,000 mg by mouth 2 (two) times daily with a meal.     Yes Historical Provider, MD  Misc Natural Products (OSTEO BI-FLEX JOINT SHIELD) TABS Take 1 tablet by mouth 2 (two) times daily.    Yes Historical Provider, MD  Multiple Vitamin (MULTIVITAMIN WITH MINERALS) TABS Take 1 tablet by mouth daily.   Yes Historical Provider, MD  OSENI 25-15 MG TABS Take 1 tablet by mouth daily. 10/24/14  Yes Historical Provider, MD  Specialty Vitamins Products (PROSTATE PO) Take 1 tablet by mouth 2 (two) times daily.   Yes Historical Provider, MD  telmisartan-hydrochlorothiazide (MICARDIS HCT) 80-25 MG per tablet Take 1 tablet by mouth daily.     Yes Historical Provider, MD  traMADol (ULTRAM) 50 MG tablet Take 1 tablet (50 mg total) by mouth every 6 (six) hours as needed. Patient taking differently: Take 50 mg by mouth every 6 (six) hours as needed for moderate pain.  11/24/14  Yes Daleen Bo, MD  vitamin E 200 UNIT capsule Take 200 Units by mouth daily.   Yes Historical Provider, MD    Scheduled Meds: . irbesartan  300 mg Oral Daily   And  . hydrochlorothiazide  25 mg Oral Daily  . insulin aspart  0-5 Units Subcutaneous QHS  . insulin aspart  0-9 Units Subcutaneous TID WC  . pantoprazole  40 mg Oral Daily   Infusions: . sodium chloride 100 mL/hr at 12/27/14 0516   PRN Meds: metaxalone, ondansetron **OR** ondansetron (ZOFRAN) IV, traMADol   Allergies as of 12/26/2014 - Review Complete 12/26/2014  Allergen Reaction Noted  . Penicillins Rash 04/07/2010    Family History  Problem Relation Age of Onset  . Heart disease Mother   . Stroke Father     Social History   Social History  . Marital Status: Married    Spouse Name: N/A  . Number of Children: 3  . Years of Education: N/A   Occupational History  . Teacher [employer]   Social History Main Topics  . Smoking status: Never Smoker   . Smokeless tobacco: Never Used    . Alcohol Use: No  . Drug Use: No  . Sexual Activity: Not Currently   Other Topics Concern  . Not on file   Social History Narrative    REVIEW OF SYSTEMS: Constitutional:  Weight drop of 25# in the last 12 months.  No fatigue. Because of his arthritic knees, he finds it difficult to exercise. ENT:  No nose bleeds Pulm:  No dyspnea on exertion, no cough. CV:  No palpitations, no LE edema.  GU:  No hematuria, no frequency GI:  No dysphagia. See HPI Heme:  No unusual bleeding or bruising other than this recent hematochezia. Has not been taking iron or B12 supplements.   Transfusions:  Per HPI Neuro:  No headaches, no peripheral tingling or numbness Derm:  No itching, no rash or sores.  Endocrine:  No sweats or chills.  No polyuria or dysuria Immunization:  Did not inquire.   PHYSICAL EXAM: Vital signs in last 24 hours: Filed Vitals:   12/26/14 2128 12/27/14 0517  BP: 134/62 140/66  Pulse: 92 74  Temp: 97.6 F (36.4 C) 97.4 F (36.3 C)  Resp: 18    Wt Readings from Last 3 Encounters:  12/27/14 81.511 kg (179 lb 11.2 oz)  12/20/13 97.523 kg (215 lb)  06/18/11 91.627 kg (202 lb)    General: Pleasant, alert, looks well. Comfortable. Head:  No swelling, no facial asymmetry. No signs of head trauma.  Eyes:  No scleral icterus. No conjunctival pallor. EOMI. Ears:  Not hard of hearing.  Nose:  No discharge or congestion Mouth:  Moist, clear oral mucosa. Dentition in good repair. Neck:  No JVD, no masses. No TMG. Lungs:  Clear bilaterally. No labored breathing. No cough. Heart: RRR. No MRG. S1/S2 audible. Abdomen:  Soft. NT. ND. No masses. No hernias. Bowel sounds active..   Rectal: Very deep burgundy stool consistent with old or blood. No masses.   Musc/Skeltl: No joint erythema or contracture deformities. Some arthritic changes in the knees bilaterally. Extremities:  No CCE.  Neurologic:  Pleasant. Alert. Oriented 3. No limb weakness. No tremors or gross neurologic  deficits. Skin:  No telangiectasia, rashes or sores. No suspicious hyperpigmented lesions. Tattoos:  None observed. Nodes:  No cervical or inguinal adenopathy.   Psych:  Pleasant, calm, cooperative.  Intake/Output from previous day: 12/19 0701 - 12/20 0700 In: -  Out: 75 [Urine:75] Intake/Output this shift:    LAB RESULTS:  Recent Labs  12/26/14 1051 12/27/14 0650  WBC 9.6 5.7  HGB 11.9* 8.7*  HCT 37.6* 26.9*  PLT 272 224   BMET Lab Results  Component Value Date   NA 138 12/26/2014   NA 140 06/15/2011   NA 140 06/14/2011   K 4.0 12/26/2014   K 3.7 06/15/2011   K 3.6 06/14/2011   CL 102 12/26/2014   CL 104 06/15/2011   CL 102 06/14/2011   CO2 27 12/26/2014   CO2 25 06/15/2011   CO2 27 06/14/2011   GLUCOSE 259* 12/26/2014   GLUCOSE 88 06/15/2011   GLUCOSE 147* 06/14/2011   BUN 15 12/26/2014   BUN 16 06/15/2011   BUN 16 06/14/2011   CREATININE 1.01 12/26/2014   CREATININE 0.82 06/15/2011   CREATININE 0.81 06/14/2011   CALCIUM 9.6 12/26/2014   CALCIUM 8.7 06/15/2011   CALCIUM 9.3 06/14/2011   LFT  Recent Labs  12/26/14 1051  PROT 7.2  ALBUMIN 4.0  AST 21  ALT 18  ALKPHOS 63  BILITOT 0.9   PT/INR Lab Results  Component Value Date   INR 1.09 06/14/2011   INR 1.09 04/07/2010    Drugs of Abuse  No results found for: LABOPIA, COCAINSCRNUR, LABBENZ, AMPHETMU, THCU, LABBARB   RADIOLOGY STUDIES: No results found.  ENDOSCOPIC STUDIES: Per HPI.   IMPRESSION:   *  painless hematochezia.  Likely recurrent diverticular bleed.   *  ABL anemia.    *   DM type 2.    *  GERD.  Controlled with  Nexium at home.   *  OSA. On CPAP nightly.    PLAN:     *   Colonoscopy at 11:30 AM tomorrow. Begin clears. Split dose prep.  *  B.i.d. CBCs.   Azucena Freed  12/27/2014, 10:22 AM Pager: [phone]      Attending physician's note   I have taken a history, examined the patient and reviewed the chart. I agree with the Advanced Practitioner's  note, impression and recommendations. Hematochezia likely secondary to diverticular bleed. Monitor Hgb and transfuse as needed to maintain Hgb>7. Will plan for colonoscopy tomorrow AM.  The risks and benefits as well as alternatives of endoscopic procedure(s) have been discussed and reviewed. All questions answered. The patient agrees to proceed.   Damaris Hippo, MD [phone] Mon-Fri 8a-5p [phone] after 5p, weekends, holidays

## 2014-12-28 NOTE — Interval H&P Note (Signed)
History and Physical Interval Note:  12/28/2014 10:53 AM  Aaron White  has presented today for surgery, with the diagnosis of Painless hematochezia, blood loss anemia.  The various methods of treatment have been discussed with the patient and family. After consideration of risks, benefits and other options for treatment, the patient has consented to  Procedure(s): COLONOSCOPY (N/A) as a surgical intervention .  The patient's history has been reviewed, patient examined, no change in status, stable for surgery.  I have reviewed the patient's chart and labs.  Questions were answered to the patient's satisfaction.     Mehak Roskelley

## 2014-12-28 NOTE — Progress Notes (Signed)
Pt off the floor to get endoscope for GI bleed.  Try to see later as pt and time allow.  Mee Hives, PT MS Acute Rehab Dept. Number: [facility] [id number] and [place] [phone]

## 2014-12-28 NOTE — Transfer of Care (Signed)
Immediate Anesthesia Transfer of Care Note  Patient: Aaron White  Procedure(s) Performed: Procedure(s): COLONOSCOPY (N/A)  Patient Location: PACU  Anesthesia Type:MAC  Level of Consciousness: patient cooperative and responds to stimulation  Airway & Oxygen Therapy: Patient Spontanous Breathing and Patient connected to face mask oxygen  Post-op Assessment: Report given to RN and Post -op Vital signs reviewed and stable  Post vital signs: Reviewed and stable  Last Vitals:  Filed Vitals:   12/28/14 0526 12/28/14 1106  BP: 133/70 138/61  Pulse: 88 73  Temp: 36.4 C 36.6 C  Resp: 20 17    Complications: No apparent anesthesia complications

## 2014-12-29 ENCOUNTER — Encounter (HOSPITAL_COMMUNITY): Payer: Self-pay | Admitting: Gastroenterology

## 2014-12-31 ENCOUNTER — Emergency Department (HOSPITAL_COMMUNITY)
Admission: EM | Admit: 2014-12-31 | Discharge: 2014-12-31 | Disposition: A | Discharge status: Home / Self Care | Payer: 59 | Attending: Emergency Medicine | Admitting: Emergency Medicine

## 2014-12-31 ENCOUNTER — Emergency Department (HOSPITAL_COMMUNITY): Payer: 59

## 2014-12-31 ENCOUNTER — Encounter (HOSPITAL_COMMUNITY): Payer: Self-pay

## 2014-12-31 DIAGNOSIS — Z880 Allergy status to penicillin: Secondary | ICD-10-CM | POA: Diagnosis not present

## 2014-12-31 DIAGNOSIS — R3915 Urgency of urination: Secondary | ICD-10-CM | POA: Diagnosis not present

## 2014-12-31 DIAGNOSIS — Z7982 Long term (current) use of aspirin: Secondary | ICD-10-CM | POA: Diagnosis not present

## 2014-12-31 DIAGNOSIS — K644 Residual hemorrhoidal skin tags: Secondary | ICD-10-CM | POA: Insufficient documentation

## 2014-12-31 DIAGNOSIS — Z9981 Dependence on supplemental oxygen: Secondary | ICD-10-CM | POA: Insufficient documentation

## 2014-12-31 DIAGNOSIS — Z79899 Other long term (current) drug therapy: Secondary | ICD-10-CM | POA: Insufficient documentation

## 2014-12-31 DIAGNOSIS — I10 Essential (primary) hypertension: Secondary | ICD-10-CM | POA: Diagnosis not present

## 2014-12-31 DIAGNOSIS — Z86010 Personal history of colonic polyps: Secondary | ICD-10-CM | POA: Insufficient documentation

## 2014-12-31 DIAGNOSIS — E119 Type 2 diabetes mellitus without complications: Secondary | ICD-10-CM | POA: Diagnosis not present

## 2014-12-31 DIAGNOSIS — Z87442 Personal history of urinary calculi: Secondary | ICD-10-CM | POA: Diagnosis not present

## 2014-12-31 DIAGNOSIS — E669 Obesity, unspecified: Secondary | ICD-10-CM | POA: Insufficient documentation

## 2014-12-31 DIAGNOSIS — M170 Bilateral primary osteoarthritis of knee: Secondary | ICD-10-CM | POA: Diagnosis not present

## 2014-12-31 DIAGNOSIS — R410 Disorientation, unspecified: Secondary | ICD-10-CM

## 2014-12-31 DIAGNOSIS — G4733 Obstructive sleep apnea (adult) (pediatric): Secondary | ICD-10-CM | POA: Diagnosis not present

## 2014-12-31 DIAGNOSIS — K219 Gastro-esophageal reflux disease without esophagitis: Secondary | ICD-10-CM | POA: Insufficient documentation

## 2014-12-31 DIAGNOSIS — F419 Anxiety disorder, unspecified: Secondary | ICD-10-CM | POA: Diagnosis not present

## 2014-12-31 DIAGNOSIS — Z7984 Long term (current) use of oral hypoglycemic drugs: Secondary | ICD-10-CM | POA: Diagnosis not present

## 2014-12-31 LAB — URINALYSIS, ROUTINE W REFLEX MICROSCOPIC
BILIRUBIN URINE: NEGATIVE
Glucose, UA: 500 mg/dL — AB
HGB URINE DIPSTICK: NEGATIVE
KETONES UR: NEGATIVE mg/dL
Leukocytes, UA: NEGATIVE
NITRITE: NEGATIVE
Protein, ur: NEGATIVE mg/dL
SPECIFIC GRAVITY, URINE: 1.019 (ref 1.005–1.030)
pH: 5 (ref 5.0–8.0)

## 2014-12-31 LAB — CBC WITH DIFFERENTIAL/PLATELET
Basophils Absolute: 0 10*3/uL (ref 0.0–0.1)
Basophils Relative: 0 %
Eosinophils Absolute: 0.1 10*3/uL (ref 0.0–0.7)
Eosinophils Relative: 1 %
HCT: 26.2 % — ABNORMAL LOW (ref 39.0–52.0)
Hemoglobin: 8.5 g/dL — ABNORMAL LOW (ref 13.0–17.0)
LYMPHS PCT: 18 %
Lymphs Abs: 1.1 10*3/uL (ref 0.7–4.0)
MCH: 29.1 pg (ref 26.0–34.0)
MCHC: 32.4 g/dL (ref 30.0–36.0)
MCV: 89.7 fL (ref 78.0–100.0)
MONO ABS: 0.5 10*3/uL (ref 0.1–1.0)
MONOS PCT: 8 %
NEUTROS ABS: 4.6 10*3/uL (ref 1.7–7.7)
Neutrophils Relative %: 73 %
Platelets: 278 10*3/uL (ref 150–400)
RBC: 2.92 MIL/uL — ABNORMAL LOW (ref 4.22–5.81)
RDW: 13.7 % (ref 11.5–15.5)
WBC: 6.3 10*3/uL (ref 4.0–10.5)

## 2014-12-31 LAB — COMPREHENSIVE METABOLIC PANEL
ALBUMIN: 3.6 g/dL (ref 3.5–5.0)
ALK PHOS: 56 U/L (ref 38–126)
ALT: 26 U/L (ref 17–63)
ANION GAP: 10 (ref 5–15)
AST: 30 U/L (ref 15–41)
BUN: 12 mg/dL (ref 6–20)
CALCIUM: 9.1 mg/dL (ref 8.9–10.3)
CO2: 25 mmol/L (ref 22–32)
Chloride: 104 mmol/L (ref 101–111)
Creatinine, Ser: 0.96 mg/dL (ref 0.61–1.24)
GFR calc Af Amer: >60 mL/min (ref >60)
GFR calc non Af Amer: >60 mL/min (ref >60)
GLUCOSE: 246 mg/dL — AB (ref 65–99)
POTASSIUM: 3.3 mmol/L — AB (ref 3.5–5.1)
SODIUM: 139 mmol/L (ref 135–145)
Total Bilirubin: 0.9 mg/dL (ref 0.3–1.2)
Total Protein: 6.6 g/dL (ref 6.5–8.1)

## 2014-12-31 LAB — I-STAT CG4 LACTIC ACID, ED
LACTIC ACID, VENOUS: 1.34 mmol/L (ref 0.5–2.0)
Lactic Acid, Venous: 1.12 mmol/L (ref 0.5–2.0)

## 2014-12-31 LAB — POC OCCULT BLOOD, ED: Fecal Occult Bld: NEGATIVE

## 2014-12-31 NOTE — ED Notes (Signed)
Nurse first advised Threasa Beards, RN to collect

## 2014-12-31 NOTE — Discharge Instructions (Signed)
See your doctor next week for follow up.   Take your medications as prescribed.   Return to ER if you have worse confusion, vomiting, headaches, fevers.

## 2014-12-31 NOTE — ED Notes (Signed)
Lab called to report urine in specimen cup had leaked out.  Need to recollect urine.

## 2014-12-31 NOTE — ED Notes (Signed)
Urine recollected and sent to lab 

## 2014-12-31 NOTE — ED Provider Notes (Signed)
CSN: [id number]     Arrival date & time 12/31/14  1502 History   First MD Initiated Contact with Patient 12/31/14 1955     Chief Complaint  Patient presents with  . Altered Mental Status     (Consider location/radiation/quality/duration/timing/severity/associated sxs/prior Treatment) The history is provided by the patient.  Aaron White is a 74 y.o. male hx of HTN, GERD, DM, here with confusion. Patient states that he was recently admitted for GI bleed. He was discharged about 2 days ago. Since then, his wife noted that he has been slower than usual. He is actually college professor and teaches classes. He sometimes forgets things in his office. He has no trouble teaching though. He sometimes forgets that he is at home and thought he is in the hospital. Denies fever or chills or vomiting. Denies any rectal bleeding. Has some urinary urgency.    Past Medical History  Diagnosis Date  . Hypertension   . Colon polyp 2008, 2012    adenomatous 2008.  mucosal prolapse polyp 04/2010  . GERD (gastroesophageal reflux disease)   . H/O hiatal hernia 2008  . Diverticulosis 2012  . Kidney stones   . Type II diabetes mellitus ([place])   . Obstructive sleep apnea on CPAP   . Arthritis     "knees" (12/26/2014). osteopenia per CT.   Marland Kitchen Anxiety   . Lower GI bleed 2012, 2013, 12/2014.     diverticular bleeding  . Obesity 2015   Past Surgical History  Procedure Laterality Date  . Knee cartilage surgery Left   . Tonsillectomy and adenoidectomy    . Lithotripsy    . Colonoscopy N/A 12/28/2014    Procedure: COLONOSCOPY;  Surgeon: Mauri Pole, MD;  Location: [facility] ENDOSCOPY;  Service: Endoscopy;  Laterality: N/A;   Family History  Problem Relation Age of Onset  . Heart disease Mother   . Stroke Father    Social History  Substance Use Topics  . Smoking status: Never Smoker   . Smokeless tobacco: Never Used  . Alcohol Use: No    Review of Systems  Psychiatric/Behavioral: Positive for  confusion.  All other systems reviewed and are negative.     Allergies  Strawberry flavor and Penicillins  Home Medications   Prior to Admission medications   Medication Sig Start Date End Date Taking? Authorizing Provider  aspirin EC 81 MG tablet Take 1 tablet (81 mg total) by mouth daily. Stop taking for a week, and if no further bleeding can resume Patient taking differently: Take 81 mg by mouth daily.  06/16/11  Yes Shanker Kristeen Mans, MD  Cholecalciferol (VITAMIN D3) 2000 UNITS capsule Take 2,000 Units by mouth daily.   Yes Historical Provider, MD  Cinnamon 500 MG capsule Take 500 mg by mouth 2 (two) times daily.   Yes Historical Provider, MD  esomeprazole (NEXIUM) 20 MG capsule Take 20 mg by mouth daily at 12 noon.   Yes Historical Provider, MD  fluticasone (FLONASE) 50 MCG/ACT nasal spray USE 1 SPRAY IN EACH NOSTRIL TWICE DAILY AS NEEDED FOR ALLERGIES 08/27/14  Yes Historical Provider, MD  hydrocortisone (ANUSOL-HC) 2.5 % rectal cream Place rectally 2 (two) times daily. 12/28/14  Yes Kelvin Cellar, MD  metaxalone (SKELAXIN) 800 MG tablet Take 800 mg by mouth daily as needed for muscle spasms. Reported on 12/26/2014 09/23/14  Yes Historical Provider, MD  metFORMIN (GLUCOPHAGE) 1000 MG tablet Take 1,000 mg by mouth 2 (two) times daily with a meal.     Yes  Historical Provider, MD  Misc Natural Products (OSTEO BI-FLEX JOINT SHIELD) TABS Take 1 tablet by mouth 2 (two) times daily.    Yes Historical Provider, MD  Multiple Vitamin (MULTIVITAMIN WITH MINERALS) TABS Take 1 tablet by mouth daily.   Yes Historical Provider, MD  OSENI 25-15 MG TABS Take 1 tablet by mouth daily. 10/24/14  Yes Historical Provider, MD  Specialty Vitamins Products (PROSTATE PO) Take 1 tablet by mouth 2 (two) times daily.   Yes Historical Provider, MD  telmisartan-hydrochlorothiazide (MICARDIS HCT) 80-25 MG per tablet Take 1 tablet by mouth daily.     Yes Historical Provider, MD  traMADol (ULTRAM) 50 MG tablet Take 1  tablet (50 mg total) by mouth every 6 (six) hours as needed. Patient taking differently: Take 50 mg by mouth every 6 (six) hours as needed for moderate pain.  11/24/14  Yes Daleen Bo, MD  vitamin E 200 UNIT capsule Take 200 Units by mouth daily.   Yes Historical Provider, MD   BP 125/59 mmHg  Pulse 69  Temp(Src) 98 F (36.7 C) (Oral)  Resp 16  Ht 5\' 10"  (1.778 m)  Wt 210 lb (95.255 kg)  BMI 30.13 kg/m2  SpO2 99% Physical Exam  Constitutional: He is oriented to person, place, and time.  Chronically ill, NAD   HENT:  Head: Normocephalic.  Mouth/Throat: Oropharynx is clear and moist.  Eyes: Conjunctivae are normal. Pupils are equal, round, and reactive to light.  Neck: Normal range of motion. Neck supple.  Cardiovascular: Normal rate, regular rhythm and normal heart sounds.   Pulmonary/Chest: Effort normal and breath sounds normal. No respiratory distress. He has no wheezes. He has no rales.  Abdominal: Soft. Bowel sounds are normal. He exhibits no distension. There is no tenderness. There is no rebound.  Genitourinary:  Rectal- external hemorrhoids, no active bleeding. Brown stool   Musculoskeletal: Normal range of motion.  Neurological: He is alert and oriented to person, place, and time.  CN 2-12 intact. Nl strength throughout   Skin: Skin is warm and dry.  Psychiatric: He has a normal mood and affect. His behavior is normal. Judgment and thought content normal.  Nursing note and vitals reviewed.   ED Course  Procedures (including critical care time) Labs Review Labs Reviewed  COMPREHENSIVE METABOLIC PANEL - Abnormal; Notable for the following:    Potassium 3.3 (*)    Glucose, Bld 246 (*)    All other components within normal limits  CBC WITH DIFFERENTIAL/PLATELET - Abnormal; Notable for the following:    RBC 2.92 (*)    Hemoglobin 8.5 (*)    HCT 26.2 (*)    All other components within normal limits  URINALYSIS, ROUTINE W REFLEX MICROSCOPIC (NOT AT [facility]) - Abnormal;  Notable for the following:    Glucose, UA 500 (*)    All other components within normal limits  URINE CULTURE  I-STAT CG4 LACTIC ACID, ED  I-STAT CG4 LACTIC ACID, ED  POC OCCULT BLOOD, ED  CBG MONITORING, ED    Imaging Review Dg Chest 2 View  12/31/2014  CLINICAL DATA:  74 year old male with altered mental status EXAM: CHEST  2 VIEW COMPARISON:  Prior chest x-ray 04/08/2010 FINDINGS: Cardiac and mediastinal contours remain within normal limits. No focal airspace consolidation, pleural effusion, pulmonary edema or pneumothorax. Stable mild bronchitic change. No acute osseous abnormality. IMPRESSION: No active cardiopulmonary disease. Electronically Signed   By: Jacqulynn Cadet M.D.   On: 12/31/2014 15:44   Ct Head Wo Contrast  12/31/2014  CLINICAL DATA:  Worsening confusion EXAM: CT HEAD WITHOUT CONTRAST TECHNIQUE: Contiguous axial images were obtained from the base of the skull through the vertex without intravenous contrast. COMPARISON:  None. FINDINGS: No skull fracture is noted. Paranasal sinuses and mastoid air cells are unremarkable. No intracranial hemorrhage, mass effect or midline shift. No acute cortical infarction. No mass lesion is noted on this unenhanced scan. Mild cerebral atrophy. Tiny lacunar infarct or prominent perivascular space noted in left basal ganglia. IMPRESSION: No acute intracranial abnormality.  Mild cerebral atrophy. Electronically Signed   By: Lahoma Crocker M.D.   On: 12/31/2014 21:46   I have personally reviewed and evaluated these images and lab results as part of my medical decision-making.   EKG Interpretation None      MDM   Final diagnoses:  None   Aaron White is a 74 y.o. male here with intermittent confusion. i suspect it is from being in the hospital recently. Will check CT head, labs, CXR, UA.   11:24 PM CT head nl, labs and UA unremarkable. Hg 8.5, slightly lower but occ neg. A &O x 3 currently, nl neuro exam. Likely disoriented from  being in the hospital recently. Recommend redirection at home and outpatient f/u.    Wandra Arthurs, MD 12/31/14 [phone]

## 2014-12-31 NOTE — ED Notes (Signed)
Dr. Yao at the bedside. 

## 2014-12-31 NOTE — ED Notes (Signed)
Pt here with c/o of worsening confusion since his discharge from the hospital on 12/19 (was admitted for bleeding hemorrhoids and had colonoscopy). His is A&OX4 right now but his wife states since he has been discharged, he has been acting strange such as thinking he has lost things or thinking he is still in the hospital. He also reports frequent urination.

## 2015-01-01 LAB — URINE CULTURE

## 2015-01-04 ENCOUNTER — Emergency Department (HOSPITAL_COMMUNITY): Payer: 59

## 2015-01-04 ENCOUNTER — Encounter (HOSPITAL_COMMUNITY): Payer: Self-pay | Admitting: Emergency Medicine

## 2015-01-04 ENCOUNTER — Telehealth: Payer: Self-pay | Admitting: Gastroenterology

## 2015-01-04 ENCOUNTER — Inpatient Hospital Stay (HOSPITAL_COMMUNITY)
Admission: EM | Admit: 2015-01-04 | Discharge: 2015-01-19 | DRG: 040 | Disposition: A | Discharge status: Hospice | Payer: 59 | Attending: Internal Medicine | Admitting: Internal Medicine

## 2015-01-04 DIAGNOSIS — K449 Diaphragmatic hernia without obstruction or gangrene: Secondary | ICD-10-CM | POA: Diagnosis present

## 2015-01-04 DIAGNOSIS — C719 Malignant neoplasm of brain, unspecified: Secondary | ICD-10-CM | POA: Diagnosis present

## 2015-01-04 DIAGNOSIS — F411 Generalized anxiety disorder: Secondary | ICD-10-CM | POA: Diagnosis not present

## 2015-01-04 DIAGNOSIS — D332 Benign neoplasm of brain, unspecified: Secondary | ICD-10-CM

## 2015-01-04 DIAGNOSIS — I82402 Acute embolism and thrombosis of unspecified deep veins of left lower extremity: Secondary | ICD-10-CM | POA: Diagnosis not present

## 2015-01-04 DIAGNOSIS — R350 Frequency of micturition: Secondary | ICD-10-CM | POA: Diagnosis present

## 2015-01-04 DIAGNOSIS — G4733 Obstructive sleep apnea (adult) (pediatric): Secondary | ICD-10-CM | POA: Diagnosis present

## 2015-01-04 DIAGNOSIS — N3943 Post-void dribbling: Secondary | ICD-10-CM | POA: Diagnosis present

## 2015-01-04 DIAGNOSIS — K921 Melena: Secondary | ICD-10-CM | POA: Diagnosis present

## 2015-01-04 DIAGNOSIS — Z823 Family history of stroke: Secondary | ICD-10-CM

## 2015-01-04 DIAGNOSIS — F32A Depression, unspecified: Secondary | ICD-10-CM | POA: Diagnosis present

## 2015-01-04 DIAGNOSIS — K649 Unspecified hemorrhoids: Secondary | ICD-10-CM | POA: Diagnosis present

## 2015-01-04 DIAGNOSIS — N4281 Prostatodynia syndrome: Secondary | ICD-10-CM | POA: Diagnosis present

## 2015-01-04 DIAGNOSIS — Z880 Allergy status to penicillin: Secondary | ICD-10-CM

## 2015-01-04 DIAGNOSIS — N400 Enlarged prostate without lower urinary tract symptoms: Secondary | ICD-10-CM | POA: Diagnosis present

## 2015-01-04 DIAGNOSIS — Z86010 Personal history of colonic polyps: Secondary | ICD-10-CM

## 2015-01-04 DIAGNOSIS — E119 Type 2 diabetes mellitus without complications: Secondary | ICD-10-CM | POA: Diagnosis present

## 2015-01-04 DIAGNOSIS — Z8249 Family history of ischemic heart disease and other diseases of the circulatory system: Secondary | ICD-10-CM | POA: Diagnosis not present

## 2015-01-04 DIAGNOSIS — D496 Neoplasm of unspecified behavior of brain: Secondary | ICD-10-CM | POA: Diagnosis present

## 2015-01-04 DIAGNOSIS — F329 Major depressive disorder, single episode, unspecified: Secondary | ICD-10-CM | POA: Diagnosis not present

## 2015-01-04 DIAGNOSIS — Z79899 Other long term (current) drug therapy: Secondary | ICD-10-CM

## 2015-01-04 DIAGNOSIS — M170 Bilateral primary osteoarthritis of knee: Secondary | ICD-10-CM | POA: Diagnosis present

## 2015-01-04 DIAGNOSIS — R310 Gross hematuria: Secondary | ICD-10-CM | POA: Insufficient documentation

## 2015-01-04 DIAGNOSIS — Z87442 Personal history of urinary calculi: Secondary | ICD-10-CM

## 2015-01-04 DIAGNOSIS — K219 Gastro-esophageal reflux disease without esophagitis: Secondary | ICD-10-CM | POA: Diagnosis present

## 2015-01-04 DIAGNOSIS — D500 Iron deficiency anemia secondary to blood loss (chronic): Secondary | ICD-10-CM | POA: Diagnosis present

## 2015-01-04 DIAGNOSIS — M8580 Other specified disorders of bone density and structure, unspecified site: Secondary | ICD-10-CM | POA: Diagnosis present

## 2015-01-04 DIAGNOSIS — D509 Iron deficiency anemia, unspecified: Secondary | ICD-10-CM | POA: Diagnosis not present

## 2015-01-04 DIAGNOSIS — Z66 Do not resuscitate: Secondary | ICD-10-CM | POA: Diagnosis present

## 2015-01-04 DIAGNOSIS — E041 Nontoxic single thyroid nodule: Secondary | ICD-10-CM | POA: Diagnosis present

## 2015-01-04 DIAGNOSIS — I10 Essential (primary) hypertension: Secondary | ICD-10-CM | POA: Diagnosis not present

## 2015-01-04 DIAGNOSIS — Z7982 Long term (current) use of aspirin: Secondary | ICD-10-CM | POA: Diagnosis not present

## 2015-01-04 DIAGNOSIS — R3915 Urgency of urination: Secondary | ICD-10-CM | POA: Diagnosis present

## 2015-01-04 DIAGNOSIS — N50812 Left testicular pain: Secondary | ICD-10-CM | POA: Diagnosis present

## 2015-01-04 DIAGNOSIS — G9340 Encephalopathy, unspecified: Secondary | ICD-10-CM | POA: Diagnosis present

## 2015-01-04 DIAGNOSIS — R319 Hematuria, unspecified: Secondary | ICD-10-CM | POA: Diagnosis present

## 2015-01-04 DIAGNOSIS — F419 Anxiety disorder, unspecified: Secondary | ICD-10-CM | POA: Diagnosis present

## 2015-01-04 DIAGNOSIS — E669 Obesity, unspecified: Secondary | ICD-10-CM | POA: Diagnosis present

## 2015-01-04 DIAGNOSIS — R410 Disorientation, unspecified: Secondary | ICD-10-CM | POA: Insufficient documentation

## 2015-01-04 DIAGNOSIS — R4182 Altered mental status, unspecified: Secondary | ICD-10-CM | POA: Diagnosis present

## 2015-01-04 DIAGNOSIS — Z515 Encounter for palliative care: Secondary | ICD-10-CM

## 2015-01-04 DIAGNOSIS — G893 Neoplasm related pain (acute) (chronic): Secondary | ICD-10-CM | POA: Diagnosis not present

## 2015-01-04 DIAGNOSIS — I82442 Acute embolism and thrombosis of left tibial vein: Secondary | ICD-10-CM | POA: Diagnosis present

## 2015-01-04 DIAGNOSIS — R400 Somnolence: Secondary | ICD-10-CM | POA: Diagnosis not present

## 2015-01-04 DIAGNOSIS — G9389 Other specified disorders of brain: Secondary | ICD-10-CM | POA: Insufficient documentation

## 2015-01-04 DIAGNOSIS — D638 Anemia in other chronic diseases classified elsewhere: Secondary | ICD-10-CM | POA: Diagnosis present

## 2015-01-04 DIAGNOSIS — G939 Disorder of brain, unspecified: Secondary | ICD-10-CM | POA: Diagnosis not present

## 2015-01-04 DIAGNOSIS — I2699 Other pulmonary embolism without acute cor pulmonale: Secondary | ICD-10-CM | POA: Diagnosis present

## 2015-01-04 DIAGNOSIS — Z7984 Long term (current) use of oral hypoglycemic drugs: Secondary | ICD-10-CM | POA: Diagnosis not present

## 2015-01-04 DIAGNOSIS — R509 Fever, unspecified: Secondary | ICD-10-CM

## 2015-01-04 DIAGNOSIS — E611 Iron deficiency: Secondary | ICD-10-CM | POA: Insufficient documentation

## 2015-01-04 LAB — URINALYSIS, ROUTINE W REFLEX MICROSCOPIC
BILIRUBIN URINE: NEGATIVE
Hgb urine dipstick: NEGATIVE
KETONES UR: NEGATIVE mg/dL
Leukocytes, UA: NEGATIVE
Nitrite: NEGATIVE
PH: 6.5 (ref 5.0–8.0)
Protein, ur: NEGATIVE mg/dL
SPECIFIC GRAVITY, URINE: 1.023 (ref 1.005–1.030)

## 2015-01-04 LAB — CBC
HEMATOCRIT: 24.4 % — AB (ref 39.0–52.0)
Hemoglobin: 8.1 g/dL — ABNORMAL LOW (ref 13.0–17.0)
MCH: 29.9 pg (ref 26.0–34.0)
MCHC: 33.2 g/dL (ref 30.0–36.0)
MCV: 90 fL (ref 78.0–100.0)
Platelets: 275 10*3/uL (ref 150–400)
RBC: 2.71 MIL/uL — ABNORMAL LOW (ref 4.22–5.81)
RDW: 13.6 % (ref 11.5–15.5)
WBC: 5.8 10*3/uL (ref 4.0–10.5)

## 2015-01-04 LAB — COMPREHENSIVE METABOLIC PANEL
ALT: 23 U/L (ref 17–63)
AST: 17 U/L (ref 15–41)
Albumin: 3.6 g/dL (ref 3.5–5.0)
Alkaline Phosphatase: 49 U/L (ref 38–126)
Anion gap: 7 (ref 5–15)
BUN: 13 mg/dL (ref 6–20)
CHLORIDE: 102 mmol/L (ref 101–111)
CO2: 30 mmol/L (ref 22–32)
Calcium: 8.8 mg/dL — ABNORMAL LOW (ref 8.9–10.3)
Creatinine, Ser: 0.89 mg/dL (ref 0.61–1.24)
GFR calc Af Amer: >60 mL/min (ref >60)
GFR calc non Af Amer: >60 mL/min (ref >60)
GLUCOSE: 249 mg/dL — AB (ref 65–99)
POTASSIUM: 3.7 mmol/L (ref 3.5–5.1)
Sodium: 139 mmol/L (ref 135–145)
Total Bilirubin: 0.6 mg/dL (ref 0.3–1.2)
Total Protein: 6.2 g/dL — ABNORMAL LOW (ref 6.5–8.1)

## 2015-01-04 LAB — CBG MONITORING, ED: GLUCOSE-CAPILLARY: 158 mg/dL — AB (ref 65–99)

## 2015-01-04 LAB — URINE MICROSCOPIC-ADD ON
Bacteria, UA: NONE SEEN
RBC / HPF: NONE SEEN RBC/hpf (ref 0–5)

## 2015-01-04 LAB — POC OCCULT BLOOD, ED: FECAL OCCULT BLD: NEGATIVE

## 2015-01-04 MED ORDER — ALPRAZOLAM 0.5 MG PO TABS: 0.5000 mg | ORAL_TABLET | Freq: Every evening | ORAL | Status: DC | PRN

## 2015-01-04 MED ORDER — HYDROCHLOROTHIAZIDE 25 MG PO TABS
25.0000 mg | ORAL_TABLET | Freq: Every day | ORAL | Status: DC
  Administered 2015-01-05 – 2015-01-07 (×3): 25 mg via ORAL
  Filled 2015-01-04 (×3): qty 1

## 2015-01-04 MED ORDER — ALOGLIPTIN-PIOGLITAZONE 25-15 MG PO TABS: 1.0000 | ORAL_TABLET | Freq: Every day | ORAL | Status: DC

## 2015-01-04 MED ORDER — METAXALONE 800 MG PO TABS
800.0000 mg | ORAL_TABLET | Freq: Every day | ORAL | Status: DC | PRN
  Filled 2015-01-04: qty 1

## 2015-01-04 MED ORDER — VITAMIN D 1000 UNITS PO TABS
2000.0000 [IU] | ORAL_TABLET | Freq: Every day | ORAL | Status: DC
  Administered 2015-01-05 – 2015-01-17 (×12): 2000 [IU] via ORAL
  Filled 2015-01-04 (×13): qty 2

## 2015-01-04 MED ORDER — METFORMIN HCL 500 MG PO TABS
1000.0000 mg | ORAL_TABLET | Freq: Two times a day (BID) | ORAL | Status: DC
  Administered 2015-01-05 – 2015-01-07 (×5): 1000 mg via ORAL
  Filled 2015-01-04 (×5): qty 2

## 2015-01-04 MED ORDER — HYDROCORTISONE 2.5 % RE CREA: 1.0000 "application " | TOPICAL_CREAM | Freq: Two times a day (BID) | RECTAL | Status: DC | PRN

## 2015-01-04 MED ORDER — RISPERIDONE 0.5 MG PO TABS
0.5000 mg | ORAL_TABLET | Freq: Every day | ORAL | Status: DC
  Administered 2015-01-04 – 2015-01-18 (×14): 0.5 mg via ORAL
  Filled 2015-01-04 (×19): qty 1

## 2015-01-04 MED ORDER — SODIUM CHLORIDE 0.9 % IJ SOLN
3.0000 mL | Freq: Two times a day (BID) | INTRAMUSCULAR | Status: DC
  Administered 2015-01-06 – 2015-01-19 (×8): 3 mL via INTRAVENOUS

## 2015-01-04 MED ORDER — FLUTICASONE PROPIONATE 50 MCG/ACT NA SUSP
1.0000 | Freq: Every day | NASAL | Status: DC
Start: 2015-01-05 — End: 2015-01-19
  Administered 2015-01-05 – 2015-01-18 (×12): 1 via NASAL
  Filled 2015-01-04 (×2): qty 16

## 2015-01-04 MED ORDER — ALPRAZOLAM 0.25 MG PO TABS
0.2500 mg | ORAL_TABLET | Freq: Three times a day (TID) | ORAL | Status: DC | PRN
  Administered 2015-01-06 – 2015-01-17 (×9): 0.25 mg via ORAL
  Filled 2015-01-04 (×10): qty 1

## 2015-01-04 MED ORDER — TELMISARTAN-HCTZ 80-25 MG PO TABS: 1.0000 | ORAL_TABLET | Freq: Every day | ORAL | Status: DC

## 2015-01-04 MED ORDER — ADULT MULTIVITAMIN W/MINERALS CH
1.0000 | ORAL_TABLET | Freq: Every day | ORAL | Status: DC
  Administered 2015-01-05 – 2015-01-17 (×12): 1 via ORAL
  Filled 2015-01-04 (×12): qty 1

## 2015-01-04 MED ORDER — CINNAMON 500 MG PO CAPS: 500.0000 mg | ORAL_CAPSULE | Freq: Two times a day (BID) | ORAL | Status: DC

## 2015-01-04 MED ORDER — PANTOPRAZOLE SODIUM 40 MG PO TBEC
40.0000 mg | DELAYED_RELEASE_TABLET | Freq: Every day | ORAL | Status: DC
  Administered 2015-01-05 – 2015-01-09 (×5): 40 mg via ORAL
  Filled 2015-01-04 (×6): qty 1

## 2015-01-04 MED ORDER — ONDANSETRON HCL 4 MG PO TABS
4.0000 mg | ORAL_TABLET | Freq: Four times a day (QID) | ORAL | Status: DC | PRN
  Administered 2015-01-15: 4 mg via ORAL
  Filled 2015-01-04: qty 1

## 2015-01-04 MED ORDER — VITAMIN E 45 MG (100 UNIT) PO CAPS
200.0000 [IU] | ORAL_CAPSULE | Freq: Every day | ORAL | Status: DC
  Administered 2015-01-05 – 2015-01-17 (×12): 200 [IU] via ORAL
  Filled 2015-01-04 (×13): qty 2

## 2015-01-04 MED ORDER — IRBESARTAN 150 MG PO TABS
300.0000 mg | ORAL_TABLET | Freq: Every day | ORAL | Status: DC
  Administered 2015-01-05 – 2015-01-18 (×13): 300 mg via ORAL
  Filled 2015-01-04 (×7): qty 2
  Filled 2015-01-04: qty 1
  Filled 2015-01-04 (×5): qty 2

## 2015-01-04 MED ORDER — POTASSIUM CHLORIDE IN NACL 20-0.9 MEQ/L-% IV SOLN
INTRAVENOUS | Status: DC
Start: 2015-01-04 — End: 2015-01-17
  Administered 2015-01-04 – 2015-01-12 (×7): via INTRAVENOUS
  Administered 2015-01-13: 1000 mL via INTRAVENOUS
  Administered 2015-01-14 – 2015-01-16 (×3): via INTRAVENOUS
  Filled 2015-01-04 (×23): qty 1000

## 2015-01-04 MED ORDER — ASPIRIN EC 81 MG PO TBEC
81.0000 mg | DELAYED_RELEASE_TABLET | Freq: Every day | ORAL | Status: DC
  Administered 2015-01-05: 81 mg via ORAL
  Filled 2015-01-04: qty 1

## 2015-01-04 MED ORDER — ONDANSETRON HCL 4 MG/2ML IJ SOLN: 4.0000 mg | Freq: Four times a day (QID) | INTRAMUSCULAR | Status: DC | PRN

## 2015-01-04 NOTE — H&P (Signed)
[facility] History and Physical  SHAHZAD WRAGG [id number] DOB: 1940/12/22 DOA: 01/04/2015  Referring physician: Gloriann Loan, PA-C PCP: Horatio Pel, MD   Chief Complaint: Altered mental status  HPI: Aaron White is a 74 y.o. male with past medical history of hypertension, type 2 diabetes, GERD, diverticulosis, urolithiasis, obstructive sleep apnea on CPAP was recently discharged from the hospital on 12/28/2014 for lower GI bleed and is being brought to the emergency department due to progressively worse confusion over the past few days.   Per patient's wife, she has noticed that since December 19th, when the patient was admitted for lower GI bleed, that the patient was not his usual self. December 19th, happens to be the date when her husband found her son dead from congestive heart failure in 2012. Since he was discharged, his symptoms really started worsening, with the patient making statements that he was afraid of dying and that he was losing his memory. They wanted to go to [place] to visit his son, but the patient did not want to go due to a fear that he was not going to be able to fly comfortably due to fears of prostate disease and frequent urination.  The patient is under the belief that his altered mental status is somewhat caused by his prostate being inflamed and a recent intraocular injection treatment. His symptoms really worsened this morning, when the patient asked his wife for help, as soon as he woke up, due to being unable to remember the date and feeling that his brain is a scrambled.   When interviewed and examined, the patient quickly lost focus and started talking about how his prostate is harming his brain. He was mildly anxious, but otherwise in no acute distress.   Review of Systems:  Unable to review due to the patient's confusion.   Past Medical History  Diagnosis Date  . Hypertension   . Colon polyp 2008, 2012    adenomatous 2008.   mucosal prolapse polyp 04/2010  . GERD (gastroesophageal reflux disease)   . H/O hiatal hernia 2008  . Diverticulosis 2012  . Kidney stones   . Type II diabetes mellitus ([place])   . Obstructive sleep apnea on CPAP   . Arthritis     "knees" (12/26/2014). osteopenia per CT.   Marland Kitchen Anxiety   . Lower GI bleed 2012, 2013, 12/2014.     diverticular bleeding  . Obesity 2015   Past Surgical History  Procedure Laterality Date  . Knee cartilage surgery Left   . Tonsillectomy and adenoidectomy    . Lithotripsy    . Colonoscopy N/A 12/28/2014    Procedure: COLONOSCOPY;  Surgeon: Mauri Pole, MD;  Location: [facility] ENDOSCOPY;  Service: Endoscopy;  Laterality: N/A;   Social History:  reports that he has never smoked. He has never used smokeless tobacco. He reports that he does not drink alcohol or use illicit drugs.  Allergies  Allergen Reactions  . Strawberry Flavor Hives  . Penicillins Rash    Has patient had a PCN reaction causing immediate rash, facial/tongue/throat swelling, SOB or lightheadedness with hypotension: No Has patient had a PCN reaction causing severe rash involving mucus membranes or skin necrosis: No Has patient had a PCN reaction that required hospitalization No Has patient had a PCN reaction occurring within the last 10 years: No If all of the above answers are "NO", then may proceed with Cephalosporin use.     Family History  Problem Relation Age of Onset  .  Heart disease Mother   . Stroke Father     Prior to Admission medications   Medication Sig Start Date End Date Taking? Authorizing Provider  aspirin EC 81 MG tablet Take 1 tablet (81 mg total) by mouth daily. Stop taking for a week, and if no further bleeding can resume Patient taking differently: Take 81 mg by mouth daily.  06/16/11  Yes Shanker Kristeen Mans, MD  Cholecalciferol (VITAMIN D3) 2000 UNITS capsule Take 2,000 Units by mouth daily.   Yes Historical Provider, MD  Cinnamon 500 MG capsule Take 500 mg by  mouth 2 (two) times daily.   Yes Historical Provider, MD  esomeprazole (NEXIUM) 20 MG capsule Take 20 mg by mouth daily at 12 noon.   Yes Historical Provider, MD  fluticasone (FLONASE) 50 MCG/ACT nasal spray USE 1 SPRAY IN EACH NOSTRIL TWICE DAILY AS NEEDED FOR ALLERGIES 08/27/14  Yes Historical Provider, MD  hydrocortisone (ANUSOL-HC) 2.5 % rectal cream Place rectally 2 (two) times daily. Patient taking differently: Place 1 application rectally 2 (two) times daily as needed for hemorrhoids.  12/28/14  Yes Kelvin Cellar, MD  metaxalone (SKELAXIN) 800 MG tablet Take 800 mg by mouth daily as needed for muscle spasms. Reported on 12/26/2014 09/23/14  Yes Historical Provider, MD  metFORMIN (GLUCOPHAGE) 1000 MG tablet Take 1,000 mg by mouth 2 (two) times daily with a meal.     Yes Historical Provider, MD  Misc Natural Products (OSTEO BI-FLEX JOINT SHIELD) TABS Take 1 tablet by mouth 2 (two) times daily.    Yes Historical Provider, MD  Multiple Vitamin (MULTIVITAMIN WITH MINERALS) TABS Take 1 tablet by mouth daily.   Yes Historical Provider, MD  OSENI 25-15 MG TABS Take 1 tablet by mouth daily. 10/24/14  Yes Historical Provider, MD  Specialty Vitamins Products (PROSTATE PO) Take 1 tablet by mouth 2 (two) times daily.   Yes Historical Provider, MD  telmisartan-hydrochlorothiazide (MICARDIS HCT) 80-25 MG per tablet Take 1 tablet by mouth daily.     Yes Historical Provider, MD  vitamin E 200 UNIT capsule Take 200 Units by mouth daily.   Yes Historical Provider, MD  traMADol (ULTRAM) 50 MG tablet Take 1 tablet (50 mg total) by mouth every 6 (six) hours as needed. Patient not taking: Reported on 01/04/2015 11/24/14   Daleen Bo, MD   Physical Exam: Filed Vitals:   01/04/15 1547 01/04/15 1821 01/04/15 1958 01/04/15 2059  BP: 128/66 136/77 144/77 137/63  Pulse: 73 69 73 71  Temp:  97.6 F (36.4 C)  98.3 F (36.8 C)  TempSrc:  Oral  Oral  Resp: 16 21 20 18   Height:    5\' 10"  (1.778 m)  Weight:     95.255 kg (210 lb)  SpO2: 99% 99% 100% 100%    Wt Readings from Last 3 Encounters:  01/04/15 95.255 kg (210 lb)  12/31/14 95.255 kg (210 lb)  12/28/14 81.194 kg (179 lb)    General:  Appears calm and comfortable Eyes: PERRL, normal lids, irises & conjunctiva ENT: grossly normal hearing, lips & tongue Neck: no LAD, masses or thyromegaly Cardiovascular: RRR, no m/r/g. No LE edema. Telemetry: SR, no arrhythmias  Respiratory: CTA bilaterally, no w/r/r. Normal respiratory effort. Abdomen: soft, ntnd Skin: no rash or induration seen on limited exam Musculoskeletal: grossly normal tone BUE/BLE Psychiatric: Confused and delusional. Neurologic: Awake alert oriented 2, disoriented to time, otherwise grossly non-focal.          Labs on Admission:  Basic Metabolic Panel:  Recent Labs Lab 12/31/14 1521 01/04/15 1418  NA 139 139  K 3.3* 3.7  CL 104 102  CO2 25 30  GLUCOSE 246* 249*  BUN 12 13  CREATININE 0.96 0.89  CALCIUM 9.1 8.8*   Liver Function Tests:  Recent Labs Lab 12/31/14 1521 01/04/15 1418  AST 30 17  ALT 26 23  ALKPHOS 56 49  BILITOT 0.9 0.6  PROT 6.6 6.2*  ALBUMIN 3.6 3.6   CBC:  Recent Labs Lab 12/31/14 1521 01/04/15 1418  WBC 6.3 5.8  NEUTROABS 4.6  --   HGB 8.5* 8.1*  HCT 26.2* 24.4*  MCV 89.7 90.0  PLT 278 275    CBG:  Recent Labs Lab 01/04/15 1926  GLUCAP 158*    Radiological Exams on Admission: US Scrotum  01/04/2015  CLINICAL DATA:  Left testicular pain. EXAM: SCROTAL ULTRASOUND DOPPLER ULTRASOUND OF THE TESTICLES TECHNIQUE: Complete ultrasound examination of the testicles, epididymis, and other scrotal structures was performed. Color and spectral Doppler ultrasound were also utilized to evaluate blood flow to the testicles. COMPARISON:  None. FINDINGS: Right testicle Measurements: 5.0 x 2.4 x 3.7 cm. No mass or microlithiasis visualized. Left testicle Measurements: 5.5 x 2.1 x 2.6 cm. Small intra testicular cyst measuring 4 mm  maximally. No mass or microlithiasis visualized. Right epididymis:  Normal in size and appearance. Left epididymis:  Normal in size and appearance. Hydrocele:  None visualized. Varicocele:  None visualized. Pulsed Doppler interrogation of both testes demonstrates normal low resistance arterial and venous waveforms bilaterally. IMPRESSION: Small benign-appearing cyst in the left testicle. Otherwise unremarkable study. Electronically Signed   By: Rolm Baptise M.D.   On: 01/04/2015 19:03   [place] Art/ven Flow Abd Pelv Doppler  01/04/2015  CLINICAL DATA:  Left testicular pain. EXAM: SCROTAL ULTRASOUND DOPPLER ULTRASOUND OF THE TESTICLES TECHNIQUE: Complete ultrasound examination of the testicles, epididymis, and other scrotal structures was performed. Color and spectral Doppler ultrasound were also utilized to evaluate blood flow to the testicles. COMPARISON:  None. FINDINGS: Right testicle Measurements: 5.0 x 2.4 x 3.7 cm. No mass or microlithiasis visualized. Left testicle Measurements: 5.5 x 2.1 x 2.6 cm. Small intra testicular cyst measuring 4 mm maximally. No mass or microlithiasis visualized. Right epididymis:  Normal in size and appearance. Left epididymis:  Normal in size and appearance. Hydrocele:  None visualized. Varicocele:  None visualized. Pulsed Doppler interrogation of both testes demonstrates normal low resistance arterial and venous waveforms bilaterally. IMPRESSION: Small benign-appearing cyst in the left testicle. Otherwise unremarkable study. Electronically Signed   By: Rolm Baptise M.D.   On: 01/04/2015 19:03       Assessment/Plan Principal Problem:   Altered mental status Symptoms could be related to recent son's death anniversary, acute illness and worsening anemia. Admit to telemetry. Check MRI of the brain without contrast in the morning. Check TSH and B12 levels. Daily at bedtime low-dose Risperidone and when necessary alprazolam. Psychiatry evaluation in the morning. Consider  evaluation by neurology.  Active Problems:   S/PLower GI bleed No signs of active bleeding now.    Anemia Monitor H&H.    HTN (hypertension) Continue current antihypertensive therapy. Monitor her blood pressure periodically.    DM (diabetes mellitus) (HCC) Continue metformin 1000 mg by mouth twice a day CBG monitoring before meals. Start regular insulin sliding scale if the patient has persistent hyperglycemia.      Psychiatry was paged earlier from the emergency department, awaiting response.   Code Status: Full code. DVT Prophylaxis: Lovenox SQ. Family  Communication: Disposition Plan: Admit for further evaluation and psych consult in the morning.  Time spent: Over 70 minutes was spent in the process of this admission.  Reubin Milan [facility] Pager [phone].

## 2015-01-04 NOTE — ED Notes (Addendum)
Per EMS. Pt has been having confusion at home. This morning his confusion was worse than usual. Pt was unsure if he was in his own house and tried to take his medications 2x. Pt repetitive with EMS. Pt also reports urinary frequency and burning with urination. Pt disoriented to time only on arrival.

## 2015-01-04 NOTE — ED Notes (Addendum)
Attempted to call report at 2002, was asked to call back in 10 minutes, called back at 2013, was asked to call back later. ED CN made aware.  Floor returned call at 2024

## 2015-01-04 NOTE — Telephone Encounter (Signed)
ok 

## 2015-01-04 NOTE — ED Notes (Addendum)
US at bedside

## 2015-01-04 NOTE — ED Notes (Signed)
Bed: WA24 Expected date:  Expected time:  Means of arrival:  Comments: Hall B 

## 2015-01-04 NOTE — ED Provider Notes (Signed)
CSN: [id number]     Arrival date & time 01/04/15  1305 History   First MD Initiated Contact with Patient 01/04/15 1507     Chief Complaint  Patient presents with  . Altered Mental Status     (Consider location/radiation/quality/duration/timing/severity/associated sxs/prior Treatment) HPI   Aaron White is a 74 y.o. male with PMH significant for HTN, DM, GERD, diverticulosis, nephrolithiasis, anxiety, depression who presents with gradually worsening confusion.  Patient was seen 12/31/14 and discharged home after normal head CT and labs for the same, and patient's wife reports he has become more and more confused.  She reports he is obsessed with going to the bathroom and will make comments like "if I don't go to the bathroom I am going to die".  Endorses increased urinary frequency and left testicle pain/discomfort along with depression and anxiety.  Denies bloody stools, abdominal pain, CP, SOB, HA, dizziness, N/V, HI, or SI.     Past Medical History  Diagnosis Date  . Hypertension   . Colon polyp 2008, 2012    adenomatous 2008.  mucosal prolapse polyp 04/2010  . GERD (gastroesophageal reflux disease)   . H/O hiatal hernia 2008  . Diverticulosis 2012  . Kidney stones   . Type II diabetes mellitus ([place])   . Obstructive sleep apnea on CPAP   . Arthritis     "knees" (12/26/2014). osteopenia per CT.   Marland Kitchen Anxiety   . Lower GI bleed 2012, 2013, 12/2014.     diverticular bleeding  . Obesity 2015   Past Surgical History  Procedure Laterality Date  . Knee cartilage surgery Left   . Tonsillectomy and adenoidectomy    . Lithotripsy    . Colonoscopy N/A 12/28/2014    Procedure: COLONOSCOPY;  Surgeon: Mauri Pole, MD;  Location: [facility] ENDOSCOPY;  Service: Endoscopy;  Laterality: N/A;   Family History  Problem Relation Age of Onset  . Heart disease Mother   . Stroke Father    Social History  Substance Use Topics  . Smoking status: Never Smoker   . Smokeless tobacco: Never  Used  . Alcohol Use: No    Review of Systems All other systems negative unless otherwise stated in HPI    Allergies  Strawberry flavor and Penicillins  Home Medications   Prior to Admission medications   Medication Sig Start Date End Date Taking? Authorizing Provider  aspirin EC 81 MG tablet Take 1 tablet (81 mg total) by mouth daily. Stop taking for a week, and if no further bleeding can resume Patient taking differently: Take 81 mg by mouth daily.  06/16/11  Yes Shanker Kristeen Mans, MD  Cholecalciferol (VITAMIN D3) 2000 UNITS capsule Take 2,000 Units by mouth daily.   Yes Historical Provider, MD  Cinnamon 500 MG capsule Take 500 mg by mouth 2 (two) times daily.   Yes Historical Provider, MD  esomeprazole (NEXIUM) 20 MG capsule Take 20 mg by mouth daily at 12 noon.   Yes Historical Provider, MD  fluticasone (FLONASE) 50 MCG/ACT nasal spray USE 1 SPRAY IN EACH NOSTRIL TWICE DAILY AS NEEDED FOR ALLERGIES 08/27/14  Yes Historical Provider, MD  hydrocortisone (ANUSOL-HC) 2.5 % rectal cream Place rectally 2 (two) times daily. Patient taking differently: Place 1 application rectally 2 (two) times daily as needed for hemorrhoids.  12/28/14  Yes Kelvin Cellar, MD  metaxalone (SKELAXIN) 800 MG tablet Take 800 mg by mouth daily as needed for muscle spasms. Reported on 12/26/2014 09/23/14  Yes Historical Provider, MD  metFORMIN (  GLUCOPHAGE) 1000 MG tablet Take 1,000 mg by mouth 2 (two) times daily with a meal.     Yes Historical Provider, MD  Misc Natural Products (OSTEO BI-FLEX JOINT SHIELD) TABS Take 1 tablet by mouth 2 (two) times daily.    Yes Historical Provider, MD  Multiple Vitamin (MULTIVITAMIN WITH MINERALS) TABS Take 1 tablet by mouth daily.   Yes Historical Provider, MD  OSENI 25-15 MG TABS Take 1 tablet by mouth daily. 10/24/14  Yes Historical Provider, MD  Specialty Vitamins Products (PROSTATE PO) Take 1 tablet by mouth 2 (two) times daily.   Yes Historical Provider, MD   telmisartan-hydrochlorothiazide (MICARDIS HCT) 80-25 MG per tablet Take 1 tablet by mouth daily.     Yes Historical Provider, MD  vitamin E 200 UNIT capsule Take 200 Units by mouth daily.   Yes Historical Provider, MD  traMADol (ULTRAM) 50 MG tablet Take 1 tablet (50 mg total) by mouth every 6 (six) hours as needed. Patient not taking: Reported on 01/04/2015 11/24/14   Daleen Bo, MD   BP 136/77 mmHg  Pulse 69  Temp(Src) 97.6 F (36.4 C) (Oral)  Resp 21  SpO2 99% Physical Exam  Constitutional: He is oriented to person, place, and time. He appears well-developed and well-nourished.  HENT:  Head: Normocephalic and atraumatic.  Mouth/Throat: Oropharynx is clear and moist.  Eyes: Conjunctivae are normal. Pupils are equal, round, and reactive to light.  Neck: Normal range of motion. Neck supple.  Cardiovascular: Normal rate, regular rhythm and normal heart sounds.   No murmur heard. Pulmonary/Chest: Effort normal and breath sounds normal. No accessory muscle usage or stridor. No respiratory distress. He has no wheezes. He has no rhonchi. He has no rales.  Abdominal: Soft. Bowel sounds are normal. He exhibits no distension. There is no tenderness.  Genitourinary: Penis normal. Guaiac negative stool. Right testis shows no mass, no swelling and no tenderness. Left testis shows tenderness. Left testis shows no mass and no swelling. No penile erythema or penile tenderness. No discharge found.  Musculoskeletal: Normal range of motion.  Lymphadenopathy:    He has no cervical adenopathy.  Neurological: He is alert and oriented to person, place, and time.  Mental Status:   AOx3.  Speech clear without dysarthria. Cranial Nerves:  I-not tested  II-PERRLA  III, IV, VI-EOMs intact  V-temporal and masseter strength intact  VII-symmetrical facial movements intact, no facial droop  VIII-hearing grossly intact bilaterally  IX, X-gag intact  XI-strength of sternomastoid and trapezius muscles  5/5  XII-tongue midline Motor:   Good muscle bulk and tone  Strength 5/5 bilaterally in upper and lower extremities   Cerebellar--RAMs, finger to nose intact  Romberg--maintains balance with eyes closed  Casual and tandem gait normal without ataxia  No pronator drift Sensory:  Intact in upper and lower extremities   Skin: Skin is warm and dry.  Psychiatric: He has a normal mood and affect. His behavior is normal.    ED Course  Procedures (including critical care time) Labs Review Labs Reviewed  COMPREHENSIVE METABOLIC PANEL - Abnormal; Notable for the following:    Glucose, Bld 249 (*)    Calcium 8.8 (*)    Total Protein 6.2 (*)    All other components within normal limits  CBC - Abnormal; Notable for the following:    RBC 2.71 (*)    Hemoglobin 8.1 (*)    HCT 24.4 (*)    All other components within normal limits  URINALYSIS, ROUTINE W REFLEX MICROSCOPIC (  NOT AT [facility]) - Abnormal; Notable for the following:    Glucose, UA >1000 (*)    All other components within normal limits  URINE MICROSCOPIC-ADD ON - Abnormal; Notable for the following:    Squamous Epithelial / LPF 0-5 (*)    All other components within normal limits  CBG MONITORING, ED - Abnormal; Notable for the following:    Glucose-Capillary 158 (*)    All other components within normal limits  POC OCCULT BLOOD, ED    Imaging Review US Scrotum  01/04/2015  CLINICAL DATA:  Left testicular pain. EXAM: SCROTAL ULTRASOUND DOPPLER ULTRASOUND OF THE TESTICLES TECHNIQUE: Complete ultrasound examination of the testicles, epididymis, and other scrotal structures was performed. Color and spectral Doppler ultrasound were also utilized to evaluate blood flow to the testicles. COMPARISON:  None. FINDINGS: Right testicle Measurements: 5.0 x 2.4 x 3.7 cm. No mass or microlithiasis visualized. Left testicle Measurements: 5.5 x 2.1 x 2.6 cm. Small intra testicular cyst measuring 4 mm maximally. No mass or microlithiasis visualized.  Right epididymis:  Normal in size and appearance. Left epididymis:  Normal in size and appearance. Hydrocele:  None visualized. Varicocele:  None visualized. Pulsed Doppler interrogation of both testes demonstrates normal low resistance arterial and venous waveforms bilaterally. IMPRESSION: Small benign-appearing cyst in the left testicle. Otherwise unremarkable study. Electronically Signed   By: Rolm Baptise M.D.   On: 01/04/2015 19:03   [place] Art/ven Flow Abd Pelv Doppler  01/04/2015  CLINICAL DATA:  Left testicular pain. EXAM: SCROTAL ULTRASOUND DOPPLER ULTRASOUND OF THE TESTICLES TECHNIQUE: Complete ultrasound examination of the testicles, epididymis, and other scrotal structures was performed. Color and spectral Doppler ultrasound were also utilized to evaluate blood flow to the testicles. COMPARISON:  None. FINDINGS: Right testicle Measurements: 5.0 x 2.4 x 3.7 cm. No mass or microlithiasis visualized. Left testicle Measurements: 5.5 x 2.1 x 2.6 cm. Small intra testicular cyst measuring 4 mm maximally. No mass or microlithiasis visualized. Right epididymis:  Normal in size and appearance. Left epididymis:  Normal in size and appearance. Hydrocele:  None visualized. Varicocele:  None visualized. Pulsed Doppler interrogation of both testes demonstrates normal low resistance arterial and venous waveforms bilaterally. IMPRESSION: Small benign-appearing cyst in the left testicle. Otherwise unremarkable study. Electronically Signed   By: Rolm Baptise M.D.   On: 01/04/2015 19:03   I have personally reviewed and evaluated these images and lab results as part of my medical decision-making.   EKG Interpretation None      MDM   Final diagnoses:  Left testicular pain  Anxious mood    Patient presents with gradually worsening confusion since being seen 12/31/14.  No fever, CP, SOB, or abdominal pain.  Patient reports increased urinary urgency and the urgency to defecate.  Patient also reports left  testicular discomfort and tenderness.  VSS, NAD.  No focal neurological deficits, AOx3.  Heart RRR, lungs CTAB, abdomen soft and benign.  Gait normal.  Negative occult blood.  CMP shows glucose 249, no anion gap CBC shows hgb 8.1, decreased from 8.5 12/31/14 UA with >1000 glucose, but no WBCs or bacteria U/S scrotum shows small benign-appearing cyst in left testicle; otherwise unremarkable.   Patient seen by Dr. Darl Householder as well.  Will admit for AMS and sense of impending doom/anxiety.  Psych consulted.     Gloriann Loan, PA-C 01/04/15 1934  Gloriann Loan, PA-C 01/04/15 2019  Wandra Arthurs, MD 01/04/15 [phone]

## 2015-01-04 NOTE — ED Notes (Signed)
[place] @ bedside, will obtain VS when finished

## 2015-01-04 NOTE — ED Notes (Signed)
Pt wife is concerned about his forgetfulness. Wanted to speak with dr to assure its not being overlooked. Wife is concerned because when he gets confused she is unable to control him at home.

## 2015-01-04 NOTE — Progress Notes (Signed)
PHARMACIST - PHYSICIAN ORDER COMMUNICATION  CONCERNING: P&T Medication Policy on Herbal Medications  DESCRIPTION:  This patient's order for:  Cinnamon  has been noted.  This product(s) is classified as an "herbal" or natural product. Due to a lack of definitive safety studies or FDA approval, nonstandard manufacturing practices, plus the potential risk of unknown drug-drug interactions while on inpatient medications, the Pharmacy and Therapeutics Committee does not permit the use of "herbal" or natural products of this type within [facility].   ACTION TAKEN: The pharmacy department is unable to verify this order at this time and your patient has been informed of this safety policy. Please reevaluate patient's clinical condition at discharge and address if the herbal or natural product(s) should be resumed at that time.   Thanks Dorrene German 01/04/2015 10:46 PM

## 2015-01-04 NOTE — Telephone Encounter (Signed)
FYI Spoke with the spouse of the patient. Patient is unable to make this call due to intense mental status changes. She states he is confused, doesn't know where he is, thinks he is urinating blood and that he is dying. She states he holds his head in his hands and cries. She states there is not any blood being passed by urination or bowel movement. He has not vomited and has taken his morning medications. He was seen in the ED 12/31/14 for confusion. She states it has worsened since then. She has contacted Dr Pennie Banter office. She was told to call GI.  I called Dr Pennie Banter office and discussed this with the triage person, Caryl Asp. She will contact Mrs. Burczyk to recommend she take him back to the ED.

## 2015-01-05 ENCOUNTER — Inpatient Hospital Stay (HOSPITAL_COMMUNITY): Payer: 59

## 2015-01-05 DIAGNOSIS — F329 Major depressive disorder, single episode, unspecified: Secondary | ICD-10-CM

## 2015-01-05 DIAGNOSIS — E119 Type 2 diabetes mellitus without complications: Secondary | ICD-10-CM

## 2015-01-05 DIAGNOSIS — I10 Essential (primary) hypertension: Secondary | ICD-10-CM

## 2015-01-05 LAB — COMPREHENSIVE METABOLIC PANEL
ALK PHOS: 63 U/L (ref 38–126)
ALT: 25 U/L (ref 17–63)
AST: 23 U/L (ref 15–41)
Albumin: 4.3 g/dL (ref 3.5–5.0)
Anion gap: 9 (ref 5–15)
BILIRUBIN TOTAL: 1.2 mg/dL (ref 0.3–1.2)
BUN: 10 mg/dL (ref 6–20)
CALCIUM: 9.4 mg/dL (ref 8.9–10.3)
CO2: 29 mmol/L (ref 22–32)
CREATININE: 0.82 mg/dL (ref 0.61–1.24)
Chloride: 104 mmol/L (ref 101–111)
GFR calc non Af Amer: >60 mL/min (ref >60)
GLUCOSE: 203 mg/dL — AB (ref 65–99)
Potassium: 3.3 mmol/L — ABNORMAL LOW (ref 3.5–5.1)
SODIUM: 142 mmol/L (ref 135–145)
TOTAL PROTEIN: 7.6 g/dL (ref 6.5–8.1)

## 2015-01-05 LAB — CBC WITH DIFFERENTIAL/PLATELET
Basophils Absolute: 0 10*3/uL (ref 0.0–0.1)
Basophils Relative: 1 %
EOS ABS: 0.1 10*3/uL (ref 0.0–0.7)
EOS PCT: 2 %
HCT: 29.8 % — ABNORMAL LOW (ref 39.0–52.0)
Hemoglobin: 9.4 g/dL — ABNORMAL LOW (ref 13.0–17.0)
LYMPHS ABS: 1.5 10*3/uL (ref 0.7–4.0)
Lymphocytes Relative: 26 %
MCH: 28 pg (ref 26.0–34.0)
MCHC: 31.5 g/dL (ref 30.0–36.0)
MCV: 88.7 fL (ref 78.0–100.0)
MONOS PCT: 8 %
Monocytes Absolute: 0.4 10*3/uL (ref 0.1–1.0)
Neutro Abs: 3.7 10*3/uL (ref 1.7–7.7)
Neutrophils Relative %: 63 %
PLATELETS: 339 10*3/uL (ref 150–400)
RBC: 3.36 MIL/uL — ABNORMAL LOW (ref 4.22–5.81)
RDW: 13.3 % (ref 11.5–15.5)
WBC: 5.9 10*3/uL (ref 4.0–10.5)

## 2015-01-05 LAB — PHOSPHORUS: Phosphorus: 2.9 mg/dL (ref 2.5–4.6)

## 2015-01-05 LAB — VITAMIN B12: VITAMIN B 12: 1101 pg/mL — AB (ref 180–914)

## 2015-01-05 LAB — TSH: TSH: 2.856 u[IU]/mL (ref 0.350–4.500)

## 2015-01-05 LAB — MAGNESIUM: Magnesium: 1.8 mg/dL (ref 1.7–2.4)

## 2015-01-05 MED ORDER — POTASSIUM CHLORIDE CRYS ER 20 MEQ PO TBCR
40.0000 meq | EXTENDED_RELEASE_TABLET | Freq: Once | ORAL | Status: AC
  Administered 2015-01-05: 40 meq via ORAL
  Filled 2015-01-05: qty 2

## 2015-01-05 NOTE — Progress Notes (Signed)
PT Cancellation Note  Patient Details Name: Aaron White MRN: [id number] DOB: November 13, 1940   Cancelled Treatment:    Reason Eval/Treat Not Completed: Other (comment) (awaiting MRI)   [name],KATHrine E 01/05/2015, 12:27 PM Carmelia Bake, PT, DPT 01/05/2015 Pager: [phone]

## 2015-01-05 NOTE — Progress Notes (Signed)
Patient ID: Aaron White, male   DOB: 1940/11/11, 74 y.o.   MRN: [id number] [facility] PROGRESS NOTE  Aaron White [id number] DOB: 06/06/1940 DOA: 01/04/2015 PCP: Horatio Pel, MD  Brief narrative:    74 y.o. male with past medical history significant for hypertension, diabetes, obstructive sleep apnea on CPAP, recently hospitalized for GI bleed who now presented to [facility] because of worsening confusion over past few days prior to this admission. Per patient's family, ever since discharge 12/26/2014 patient was not really himself. Patient would say things such as him being afraid of dying and that he is losing the mammary and he wanted to see his son in [place] but was worried as he has a fear of flying.  Patient was hemodynamically stable on the admission. Blood work was significant for hemoglobin of 8.1, otherwise unremarkable. He was admitted for further evaluation of acute encephalopathy.  Assessment/Plan:    Principal Problem: Acute encephalopathy - Unclear etiology - Will place order for MRI of the brain for further evaluation - PT evaluation pending  Active Problems: Essential hypertension - Continue HCTZ and avapro  Controlled diabetes mellitus type 2 without complications without long-term insulin use - Continue alogliptin-Pioglitazone and metformin  Depression - Continue risperidone 0.5 mg at bedtime   DVT Prophylaxis  - SCD's bilaterally   Code Status: Full.  Family Communication:  plan of care discussed with the patient Disposition Plan: Anticipate D/C 01/06/2015.  IV access:  Peripheral IV  Procedures and diagnostic studies:    US Scrotum 01/04/2015  Small benign-appearing cyst in the left testicle. Otherwise unremarkable study.   [place] Art/ven Flow Abd Pelv Doppler 01/04/2015  Small benign-appearing cyst in the left testicle. Otherwise unremarkable study.   Medical Consultants:  None   Other Consultants:  None    IAnti-Infectives:   None    Leisa Lenz, MD  [facility] Pager [phone]  Time spent in minutes: 25 minutes  If 7PM-7AM, please contact night-coverage [email or web address] Password [email or web address] 01/05/2015, 7:29 AM   LOS: 1 day    HPI/Subjective: No acute overnight events. Patient reports intermittent confusion.  Objective: Filed Vitals:   01/04/15 1958 01/04/15 2059 01/05/15 0034 01/05/15 0634  BP: 144/77 137/63  140/59  Pulse: 73 71 74 75  Temp:  98.3 F (36.8 C)  97.4 F (36.3 C)  TempSrc:  Oral  Oral  Resp: 20 18 16 18   Height:  5\' 10"  (1.778 m)    Weight:  95.255 kg (210 lb)    SpO2: 100% 100% 95% 92%    Intake/Output Summary (Last 24 hours) at 01/05/15 0729 Last data filed at 01/05/15 [id number]  Gross per 24 hour  Intake      0 ml  Output    300 ml  Net   -300 ml    Exam:   General:  Pt is alert, follows commands appropriately, not in acute distress  Cardiovascular: Regular rate and rhythm, S1/S2, no murmurs  Respiratory: Clear to auscultation bilaterally, no wheezing, no crackles, no rhonchi  Abdomen: Soft, non tender, non distended, bowel sounds present  Extremities: No edema, pulses DP and PT palpable bilaterally  Neuro: Grossly nonfocal  Data Reviewed: Basic Metabolic Panel:  Recent Labs Lab 12/31/14 1521 01/04/15 1418 01/05/15 0400  NA 139 139 142  K 3.3* 3.7 3.3*  CL 104 102 104  CO2 25 30 29   GLUCOSE 246* 249* 203*  BUN 12 13 10   CREATININE 0.96 0.89 0.82  CALCIUM 9.1 8.8* 9.4  MG  --   --  1.8  PHOS  --   --  2.9   Liver Function Tests:  Recent Labs Lab 12/31/14 1521 01/04/15 1418 01/05/15 0400  AST 30 17 23   ALT 26 23 25   ALKPHOS 56 49 63  BILITOT 0.9 0.6 1.2  PROT 6.6 6.2* 7.6  ALBUMIN 3.6 3.6 4.3   No results for input(s): LIPASE, AMYLASE in the last 168 hours. No results for input(s): AMMONIA in the last 168 hours. CBC:  Recent Labs Lab 12/31/14 1521 01/04/15 1418 01/05/15 0400  WBC 6.3 5.8 5.9  NEUTROABS  4.6  --  3.7  HGB 8.5* 8.1* 9.4*  HCT 26.2* 24.4* 29.8*  MCV 89.7 90.0 88.7  PLT 278 275 339   Cardiac Enzymes: No results for input(s): CKTOTAL, CKMB, CKMBINDEX, TROPONINI in the last 168 hours. BNP: Invalid input(s): POCBNP CBG:  Recent Labs Lab 01/04/15 1926  GLUCAP 158*    Recent Results (from the past 240 hour(s))  Urine culture     Status: None   Collection Time: 12/31/14  4:31 PM  Result Value Ref Range Status   Specimen Description URINE, CLEAN CATCH  Final   Special Requests NONE  Final   Culture MULTIPLE SPECIES PRESENT, SUGGEST RECOLLECTION  Final   Report Status 01/01/2015 FINAL  Final     Scheduled Meds: . Alogliptin-Pioglitazone  1 tablet Oral Daily  . aspirin EC  81 mg Oral Daily  . cholecalciferol  2,000 Units Oral Daily  . fluticasone  1 spray Each Nare Daily  . irbesartan  300 mg Oral Daily   And  . hydrochlorothiazide  25 mg Oral Daily  . metFORMIN  1,000 mg Oral BID WC  . multivitamin with minerals  1 tablet Oral Daily  . pantoprazole  40 mg Oral Daily  . potassium chloride SA  40 mEq Oral Once  . risperiDONE  0.5 mg Oral QHS  . vitamin E  200 Units Oral Daily   Continuous Infusions: . 0.9 % NaCl with KCl 20 mEq / L 75 mL/hr at 01/04/15 2315

## 2015-01-06 ENCOUNTER — Inpatient Hospital Stay (HOSPITAL_COMMUNITY): Payer: 59

## 2015-01-06 ENCOUNTER — Encounter (HOSPITAL_COMMUNITY): Payer: Self-pay | Admitting: Internal Medicine

## 2015-01-06 ENCOUNTER — Ambulatory Visit: Payer: Medicare Other | Attending: Radiation Oncology | Admitting: Radiation Oncology

## 2015-01-06 ENCOUNTER — Ambulatory Visit
Admit: 2015-01-06 | Discharge: 2015-01-06 | Disposition: A | Discharge status: Home / Self Care | Payer: Medicare Other | Attending: Radiation Oncology | Admitting: Radiation Oncology

## 2015-01-06 ENCOUNTER — Ambulatory Visit
Admission: RE | Admit: 2015-01-06 | Discharge: 2015-01-06 | Disposition: A | Discharge status: Home / Self Care | Payer: Medicare Other | Source: Ambulatory Visit | Attending: Radiation Oncology | Admitting: Radiation Oncology

## 2015-01-06 DIAGNOSIS — F329 Major depressive disorder, single episode, unspecified: Secondary | ICD-10-CM | POA: Diagnosis present

## 2015-01-06 DIAGNOSIS — E119 Type 2 diabetes mellitus without complications: Secondary | ICD-10-CM

## 2015-01-06 DIAGNOSIS — F32A Depression, unspecified: Secondary | ICD-10-CM | POA: Diagnosis present

## 2015-01-06 DIAGNOSIS — G939 Disorder of brain, unspecified: Secondary | ICD-10-CM

## 2015-01-06 DIAGNOSIS — R4182 Altered mental status, unspecified: Secondary | ICD-10-CM

## 2015-01-06 DIAGNOSIS — D496 Neoplasm of unspecified behavior of brain: Secondary | ICD-10-CM

## 2015-01-06 DIAGNOSIS — G9340 Encephalopathy, unspecified: Secondary | ICD-10-CM | POA: Diagnosis present

## 2015-01-06 DIAGNOSIS — C719 Malignant neoplasm of brain, unspecified: Secondary | ICD-10-CM | POA: Diagnosis present

## 2015-01-06 DIAGNOSIS — D638 Anemia in other chronic diseases classified elsewhere: Secondary | ICD-10-CM | POA: Diagnosis present

## 2015-01-06 LAB — CBC WITH DIFFERENTIAL/PLATELET
BASOS ABS: 0 10*3/uL (ref 0.0–0.1)
BASOS PCT: 1 %
EOS ABS: 0.2 10*3/uL (ref 0.0–0.7)
Eosinophils Relative: 3 %
HEMATOCRIT: 23 % — AB (ref 39.0–52.0)
HEMOGLOBIN: 7.4 g/dL — AB (ref 13.0–17.0)
Lymphocytes Relative: 27 %
Lymphs Abs: 1.6 10*3/uL (ref 0.7–4.0)
MCH: 28.6 pg (ref 26.0–34.0)
MCHC: 32.2 g/dL (ref 30.0–36.0)
MCV: 88.8 fL (ref 78.0–100.0)
MONOS PCT: 9 %
Monocytes Absolute: 0.5 10*3/uL (ref 0.1–1.0)
NEUTROS ABS: 3.6 10*3/uL (ref 1.7–7.7)
NEUTROS PCT: 61 %
Platelets: 267 10*3/uL (ref 150–400)
RBC: 2.59 MIL/uL — ABNORMAL LOW (ref 4.22–5.81)
RDW: 13.5 % (ref 11.5–15.5)
WBC: 5.9 10*3/uL (ref 4.0–10.5)

## 2015-01-06 LAB — COMPREHENSIVE METABOLIC PANEL
ALBUMIN: 3.1 g/dL — AB (ref 3.5–5.0)
ALT: 20 U/L (ref 17–63)
AST: 17 U/L (ref 15–41)
Alkaline Phosphatase: 48 U/L (ref 38–126)
Anion gap: 6 (ref 5–15)
BUN: 11 mg/dL (ref 6–20)
CHLORIDE: 107 mmol/L (ref 101–111)
CO2: 26 mmol/L (ref 22–32)
Calcium: 8.6 mg/dL — ABNORMAL LOW (ref 8.9–10.3)
Creatinine, Ser: 0.96 mg/dL (ref 0.61–1.24)
GFR calc Af Amer: >60 mL/min (ref >60)
GFR calc non Af Amer: >60 mL/min (ref >60)
GLUCOSE: 163 mg/dL — AB (ref 65–99)
POTASSIUM: 4 mmol/L (ref 3.5–5.1)
Sodium: 139 mmol/L (ref 135–145)
Total Bilirubin: 0.9 mg/dL (ref 0.3–1.2)
Total Protein: 5.7 g/dL — ABNORMAL LOW (ref 6.5–8.1)

## 2015-01-06 LAB — ABO/RH: ABO/RH(D): O POS

## 2015-01-06 LAB — PSA, TOTAL AND FREE
PROSTATE SPECIFIC AG, SERUM: 4.3 ng/mL — AB (ref 0.0–4.0)
PSA FREE PCT: 21.9 %
PSA, Free: 0.94 ng/mL

## 2015-01-06 LAB — GLUCOSE, CAPILLARY
GLUCOSE-CAPILLARY: 198 mg/dL — AB (ref 65–99)
Glucose-Capillary: 177 mg/dL — ABNORMAL HIGH (ref 65–99)
Glucose-Capillary: 155 mg/dL — ABNORMAL HIGH (ref 65–99)

## 2015-01-06 LAB — PREPARE RBC (CROSSMATCH)

## 2015-01-06 MED ORDER — IOHEXOL 300 MG/ML  SOLN
100.0000 mL | Freq: Once | INTRAMUSCULAR | Status: AC | PRN
  Administered 2015-01-06: 100 mL via INTRAVENOUS

## 2015-01-06 MED ORDER — ALOGLIPTIN-PIOGLITAZONE 25-15 MG PO TABS
1.0000 | ORAL_TABLET | Freq: Every day | ORAL | Status: DC
  Administered 2015-01-06 – 2015-01-07 (×2): 1 via ORAL

## 2015-01-06 MED ORDER — SODIUM CHLORIDE 0.9 % IV SOLN: Freq: Once | INTRAVENOUS | Status: DC

## 2015-01-06 MED ORDER — IOHEXOL 300 MG/ML  SOLN
50.0000 mL | Freq: Once | INTRAMUSCULAR | Status: AC | PRN
  Administered 2015-01-06: 50 mL via ORAL

## 2015-01-06 MED ORDER — INSULIN ASPART 100 UNIT/ML ~~LOC~~ SOLN
0.0000 [IU] | Freq: Three times a day (TID) | SUBCUTANEOUS | Status: DC
  Administered 2015-01-06: 2 [IU] via SUBCUTANEOUS
  Administered 2015-01-07: 1 [IU] via SUBCUTANEOUS
  Administered 2015-01-07 – 2015-01-08 (×2): 2 [IU] via SUBCUTANEOUS
  Administered 2015-01-08: 3 [IU] via SUBCUTANEOUS
  Administered 2015-01-08: 1 [IU] via SUBCUTANEOUS
  Administered 2015-01-08: 3 [IU] via SUBCUTANEOUS
  Administered 2015-01-09: 1 [IU] via SUBCUTANEOUS
  Administered 2015-01-09: 2 [IU] via SUBCUTANEOUS
  Administered 2015-01-09: 1 [IU] via SUBCUTANEOUS
  Administered 2015-01-10: 3 [IU] via SUBCUTANEOUS
  Administered 2015-01-10: 1 [IU] via SUBCUTANEOUS
  Administered 2015-01-10: 2 [IU] via SUBCUTANEOUS
  Administered 2015-01-12: 1 [IU] via SUBCUTANEOUS
  Administered 2015-01-12: 2 [IU] via SUBCUTANEOUS
  Administered 2015-01-12: 1 [IU] via SUBCUTANEOUS

## 2015-01-06 NOTE — Evaluation (Signed)
Physical Therapy Evaluation Patient Details Name: Aaron White MRN: [id number] DOB: [birth year] Today's Date: 01/06/2015   History of Present Illness  74 yo male admitted with AMS. Hx of HTN,DM, sleep apnea, bil knee OA  Clinical Impression  On eval, pt was Min guard assist for mobility-walked ~750 feet. Initially unsteady and required assist to stabilize after standing. Pt still has some confusion-wife acknowledges this as well. Pt will likely require 24 hour supervision. Recommend HHPT follow up as well.     Follow Up Recommendations Home health PT;Supervision/Assistance - 24 hour    Equipment Recommendations  None recommended by PT    Recommendations for Other Services       Precautions / Restrictions Precautions Precautions: Fall Restrictions Weight Bearing Restrictions: No      Mobility  Bed Mobility               General bed mobility comments: pt oob in recliner  Transfers Overall transfer level: Needs assistance   Transfers: Sit to/from Stand Sit to Stand: Min assist         General transfer comment: assist to stabilize initially.   Ambulation/Gait Ambulation/Gait assistance: Min guard Ambulation Distance (Feet): 750 Feet Assistive device: None (IV pole) Gait Pattern/deviations: Step-through pattern;Decreased stride length;Trunk flexed     General Gait Details: VCs posture. Began walking while holding on to IV pole. Able to transition to walking without a device. slow gait speed intermittently  Stairs            Wheelchair Mobility    Modified Rankin (Stroke Patients Only)       Balance Overall balance assessment: Needs assistance         Standing balance support: During functional activity Standing balance-Leahy Scale: Good Standing balance comment: Had pt perform static standing with EO/EC, narrow BOS, withstanding of external perturbations, 360 degree turns, picking up object-close guard/Min guard assist              High level balance activites: Side stepping;Backward walking;Direction changes;Turns;Sudden stops;Head turns High Level Balance Comments: Performed all tasks with close guard/Min guard assist and increased time.             Pertinent Vitals/Pain Pain Assessment: 0-10 Pain Score: 2  Pain Location: bil knees Pain Descriptors / Indicators: Sore;Aching Pain Intervention(s): Monitored during session;Repositioned    Home Living Family/patient expects to be discharged to:: Private residence Living Arrangements: Spouse/significant other Available Help at Discharge: Family Type of Home: House Home Access: Stairs to enter   Technical brewer of Steps: 1+1 Home Layout: Multi-level;Able to live on main level with bedroom/bathroom        Prior Function Level of Independence: Independent               Hand Dominance        Extremity/Trunk Assessment   Upper Extremity Assessment: Overall WFL for tasks assessed           Lower Extremity Assessment: Generalized weakness      Cervical / Trunk Assessment: Kyphotic  Communication   Communication: No difficulties  Cognition Arousal/Alertness: Awake/alert Behavior During Therapy: WFL for tasks assessed/performed Overall Cognitive Status: Impaired/Different from baseline Area of Impairment: Orientation;Memory Orientation Level: Place                  General Comments      Exercises        Assessment/Plan    PT Assessment Patient needs continued PT services  PT Diagnosis Difficulty walking  PT Problem List Decreased balance  PT Treatment Interventions Gait training;Functional mobility training;Therapeutic activities;Patient/family education;Balance training;Therapeutic exercise   PT Goals (Current goals can be found in the Care Plan section) Acute Rehab PT Goals Patient Stated Goal: to get TKA's done in Summer 2017. PT Goal Formulation: With patient/family Time For Goal Achievement:  01/20/15 Potential to Achieve Goals: Good    Frequency Min 3X/week   Barriers to discharge        Co-evaluation               End of Session   Activity Tolerance: Patient tolerated treatment well Patient left: in chair;with call bell/phone within reach;with chair alarm set;with family/visitor present           Time: 1139-1207 PT Time Calculation (min) (ACUTE ONLY): 28 min   Charges:   PT Evaluation $Initial PT Evaluation Tier I: 1 Procedure PT Treatments $Gait Training: 8-22 mins   PT G Codes:        Weston Anna, MPT Pager: [phone]

## 2015-01-06 NOTE — Consult Note (Signed)
Referral MD  Reason for Referral: Infiltrating mass into the corpus callosum  Chief Complaint  Patient presents with  . Altered Mental Status  : I'm not sure as to why I am here.  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  He's been in decent health. Recently, he's been having some mental status changes. He ultimately was  admitted to the hospital. He had a CT of the brain back on December 24. Surprising enough, this did not show any obvious abnormalities.  He then underwent a MRI of the brain. This was on 12/29. This, showed a mass lesion infiltrating the splenium of the corpus callosum. This extended into the posterior medial temporal lobes bilaterally.  It was felt that this likely represented a glioblastoma. However, lymphoma and acute toxic demyelination were noted to be possible.  Lab work that was done probably show that he was quite anemic. His hemoglobin was 9.4. Platelet count was 339. His BUN was 10 and creatinine was 0.82. Potassium 3.3.  Today, his hemoglobin was down to 7.4. His stools and been heme negative. He was found to have some hematochezia. He underwent a colonoscopy on December 21. This did not show any obvious active bleeding. It felt that he may have had some diverticular bleed.  He had a chest x-ray on the 24th which was negative.  We are asked to see him to offer recommendation.  He has never smoked. He really does not drink. Is no obvious occupational exposures. Family history his previous noncontributory.  He seemed fairly alert. He loved talking about basketball.  Currently, his performance status is ECOG 1.    History reviewed. No pertinent past medical history.:  Past Surgical History  Procedure Laterality Date  . Knee cartilage surgery Left   . Tonsillectomy and adenoidectomy    . Lithotripsy    . Colonoscopy N/A 12/28/2014    Procedure: COLONOSCOPY;  Surgeon: Mauri Pole,  MD;  Location: [facility] ENDOSCOPY;  Service: Endoscopy;  Laterality: N/A;  :   Current facility-administered medications:  .  0.9 % NaCl with KCl 20 mEq/ L  infusion, , Intravenous, Continuous, Robbie Lis, MD, Last Rate: 50 mL/hr at 01/06/15 1001 .  Alogliptin-Pioglitazone 25-15 MG TABS 1 tablet, 1 tablet, Oral, Daily, Robbie Lis, MD, 1 tablet at 01/06/15 1358 .  ALPRAZolam Duanne Moron) tablet 0.25 mg, 0.25 mg, Oral, TID PRN, Reubin Milan, MD, 0.25 mg at 01/06/15 1358 .  cholecalciferol (VITAMIN D) tablet 2,000 Units, 2,000 Units, Oral, Daily, Reubin Milan, MD, 2,000 Units at 01/06/15 1001 .  fluticasone (FLONASE) 50 MCG/ACT nasal spray 1 spray, 1 spray, Each Nare, Daily, Reubin Milan, MD, 1 spray at 01/06/15 1002 .  irbesartan (AVAPRO) tablet 300 mg, 300 mg, Oral, Daily, 300 mg at 01/06/15 1002 **AND** hydrochlorothiazide (HYDRODIURIL) tablet 25 mg, 25 mg, Oral, Daily, Reubin Milan, MD, 25 mg at 01/06/15 1002 .  hydrocortisone (ANUSOL-HC) 2.5 % rectal cream 1 application, 1 application, Rectal, BID PRN, Reubin Milan, MD .  insulin aspart (novoLOG) injection 0-9 Units, 0-9 Units, Subcutaneous, TID WC, Robbie Lis, MD, 2 Units at 01/06/15 1757 .  metFORMIN (GLUCOPHAGE) tablet 1,000 mg, 1,000 mg, Oral, BID WC, Reubin Milan, MD, 1,000 mg at 01/06/15 1757 .  multivitamin with minerals tablet 1 tablet, 1 tablet, Oral, Daily, Reubin Milan, MD, 1 tablet at 01/06/15 1002 .  ondansetron (ZOFRAN) tablet 4 mg, 4 mg, Oral,  Q6H PRN **OR** ondansetron (ZOFRAN) injection 4 mg, 4 mg, Intravenous, Q6H PRN, Reubin Milan, MD .  pantoprazole (PROTONIX) EC tablet 40 mg, 40 mg, Oral, Daily, Reubin Milan, MD, 40 mg at 01/06/15 1001 .  risperiDONE (RISPERDAL) tablet 0.5 mg, 0.5 mg, Oral, QHS, Reubin Milan, MD, 0.5 mg at 01/05/15 2218 .  sodium chloride 0.9 % injection 3 mL, 3 mL, Intravenous, Q12H, Reubin Milan, MD, 3 mL at 01/04/15 2315 .  vitamin E capsule  200 Units, 200 Units, Oral, Daily, Reubin Milan, MD, 200 Units at 01/06/15 1002:  Marland Kitchen Alogliptin-Pioglitazone  1 tablet Oral Daily  . cholecalciferol  2,000 Units Oral Daily  . fluticasone  1 spray Each Nare Daily  . irbesartan  300 mg Oral Daily   And  . hydrochlorothiazide  25 mg Oral Daily  . insulin aspart  0-9 Units Subcutaneous TID WC  . metFORMIN  1,000 mg Oral BID WC  . multivitamin with minerals  1 tablet Oral Daily  . pantoprazole  40 mg Oral Daily  . risperiDONE  0.5 mg Oral QHS  . sodium chloride  3 mL Intravenous Q12H  . vitamin E  200 Units Oral Daily  :  Allergies  Allergen Reactions  . Strawberry Flavor Hives  . Penicillins Rash    Has patient had a PCN reaction causing immediate rash, facial/tongue/throat swelling, SOB or lightheadedness with hypotension: No Has patient had a PCN reaction causing severe rash involving mucus membranes or skin necrosis: No Has patient had a PCN reaction that required hospitalization No Has patient had a PCN reaction occurring within the last 10 years: No If all of the above answers are "NO", then may proceed with Cephalosporin use.   :  Family History  Problem Relation Age of Onset  . Heart disease Mother   . Stroke Father   :  Social History   Social History  . Marital Status: Married    Spouse Name: N/A  . Number of Children: 3  . Years of Education: N/A   Occupational History  . Teacher [employer]   Social History Main Topics  . Smoking status: Never Smoker   . Smokeless tobacco: Never Used  . Alcohol Use: No  . Drug Use: No  . Sexual Activity: Not Currently   Other Topics Concern  . Not on file   Social History Narrative  :  Pertinent items are noted in HPI.  Exam: Patient Vitals for the past 24 hrs:  BP Temp Temp src Pulse Resp SpO2  01/06/15 1820 131/67 mmHg 98.4 F (36.9 C) Oral 90 18 100 %  01/06/15 1340 (!) 125/55 mmHg 98.5 F (36.9 C) Oral 77 20 99 %  01/06/15 0452 (!) 116/47 mmHg  98.2 F (36.8 C) Oral 77 16 98 %  01/05/15 2129 (!) 141/65 mmHg 98.2 F (36.8 C) Oral 75 17 99 %    as above    Recent Labs  01/05/15 0400 01/06/15 0535  WBC 5.9 5.9  HGB 9.4* 7.4*  HCT 29.8* 23.0*  PLT 339 267    Recent Labs  01/05/15 0400 01/06/15 0535  NA 142 139  K 3.3* 4.0  CL 104 107  CO2 29 26  GLUCOSE 203* 163*  BUN 10 11  CREATININE 0.82 0.96  CALCIUM 9.4 8.6*    Blood smear review:  none   Pathology: None     Assessment and Plan:   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  It looks like he has an infiltrating mass into the corpus callosum. Very few tumors can do this. We'll blastoma is certainly possible. Lymphoma is also possible.  This is truly inoperable but yet is very treatable. I believe that he definitely will need a biopsy. The prognosis definitely would be based upon the pathology. In addition, therapy is totally different for glioblastoma as opposed to a CNS lymphoma.  He looks like he is in fairly good shape. As such, it would be worth while doing a biopsy to find the histology. He would be able to handle treatment. Treatment could definitely improve his quality of life and also to some degree quantity of life.  Unfortunately, his wife was not with him. I will have to try and talk to her later.  I probably would make sure that there is no obvious issues elsewhere. I would probably set him up with a CT scan of the body to make sure that there is no potential primary that could be causing this.  I will also send off an LDH.  Given that it is the holiday weekend, I'm not sure if a biopsy would be done until next week.  I very much appreciate the opportunity of seeing him. He is very interesting. It was really nice talking to him about basketball.  We will follow him along and provide further recommendations once we get more studies back.  [name] E.  Hebrews 12:12.

## 2015-01-06 NOTE — Progress Notes (Signed)
Inpatient Diabetes Program Recommendations  AACE/ADA: New Consensus Statement on Inpatient Glycemic Control (2015)  Target Ranges:  Prepandial:   less than 140 mg/dL      Peak postprandial:   less than 180 mg/dL (1-2 hours)      Critically ill patients:  140 - 180 mg/dL    Home DM Meds: Metformin 1000 mg bid       Oseni (Alogliptin and Pioglitazone) 25/15 mg daily  Current Insulin Orders: Metformin 1000 mg bid                 Oseni (Alogliptin and Pioglitazone) 25/15 mg daily      MD- Please place order for CBG checks for this patient TID AC + HS  May want to consider adding Novolog Sensitive SSI if CBGs elevated while here in hospital     --Will follow patient during hospitalization--  Wyn Quaker RN, MSN, CDE Diabetes Coordinator Inpatient Glycemic Control Team Team Pager: [phone] (8a-5p)

## 2015-01-06 NOTE — Consult Note (Signed)
Reason for Consult: Brain tumor Referring Physician: Dr. Corie Chiquito is an 74 y.o. male.  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 The patient has been having some confusion and had a head CT performed on 12/31/2014 which was unremarkable. The patient's confusion continued and he was admitted. The workup has included a brain MRI which demonstrated a posterior corpus callosum mass. A neurosurgical consultation has been requested by Dr. Charlies Silvers.  Presently the patient is alert and pleasant. He denies headaches. He admits to some confusion.  History reviewed. No pertinent past medical history.  Past Surgical History  Procedure Laterality Date  . Knee cartilage surgery Left   . Tonsillectomy and adenoidectomy    . Lithotripsy    . Colonoscopy N/A 12/28/2014    Procedure: COLONOSCOPY;  Surgeon: Mauri Pole, MD;  Location: [facility] ENDOSCOPY;  Service: Endoscopy;  Laterality: N/A;    Family History  Problem Relation Age of Onset  . Heart disease Mother   . Stroke Father     Social History:  reports that he has never smoked. He has never used smokeless tobacco. He reports that he does not drink alcohol or use illicit drugs.  Allergies:  Allergies  Allergen Reactions  . Strawberry Flavor Hives  . Penicillins Rash    Has patient had a PCN reaction causing immediate rash, facial/tongue/throat swelling, SOB or lightheadedness with hypotension: No Has patient had a PCN reaction causing severe rash involving mucus membranes or skin necrosis: No Has patient had a PCN reaction that required hospitalization No Has patient had a PCN reaction occurring within the last 10 years: No If all of the above answers are "NO", then may proceed with Cephalosporin use.     Medications:  I have reviewed the patient's current medications. Prior to Admission:  Prescriptions prior to admission  Medication Sig Dispense Refill Last Dose  .  aspirin EC 81 MG tablet Take 1 tablet (81 mg total) by mouth daily. Stop taking for a week, and if no further bleeding can resume (Patient taking differently: Take 81 mg by mouth daily. )   01/03/2015 at Unknown time  . Cholecalciferol (VITAMIN D3) 2000 UNITS capsule Take 2,000 Units by mouth daily.   01/04/2015 at Unknown time  . Cinnamon 500 MG capsule Take 500 mg by mouth 2 (two) times daily.   01/04/2015 at Unknown time  . esomeprazole (NEXIUM) 20 MG capsule Take 20 mg by mouth daily at 12 noon.   01/04/2015 at Unknown time  . fluticasone (FLONASE) 50 MCG/ACT nasal spray USE 1 SPRAY IN EACH NOSTRIL TWICE DAILY AS NEEDED FOR ALLERGIES  4 01/03/2015 at Unknown time  . hydrocortisone (ANUSOL-HC) 2.5 % rectal cream Place rectally 2 (two) times daily. (Patient taking differently: Place 1 application rectally 2 (two) times daily as needed for hemorrhoids. ) 30 g 0 Past Week at Unknown time  . metaxalone (SKELAXIN) 800 MG tablet Take 800 mg by mouth daily as needed for muscle spasms. Reported on 12/26/2014  2 unknown  . metFORMIN (GLUCOPHAGE) 1000 MG tablet Take 1,000 mg by mouth 2 (two) times daily with a meal.     01/04/2015 at Unknown time  . Misc Natural Products (OSTEO BI-FLEX JOINT SHIELD) TABS Take 1 tablet by mouth 2 (two) times daily.    01/04/2015 at Unknown time  . Multiple Vitamin (MULTIVITAMIN WITH MINERALS) TABS Take 1 tablet by mouth daily.   01/04/2015 at Unknown time  .  OSENI 25-15 MG TABS Take 1 tablet by mouth daily.  10 01/04/2015 at Unknown time  . Specialty Vitamins Products (PROSTATE PO) Take 1 tablet by mouth 2 (two) times daily.   01/04/2015 at Unknown time  . telmisartan-hydrochlorothiazide (MICARDIS HCT) 80-25 MG per tablet Take 1 tablet by mouth daily.     01/04/2015 at Unknown time  . vitamin E 200 UNIT capsule Take 200 Units by mouth daily.   01/04/2015 at Unknown time  . traMADol (ULTRAM) 50 MG tablet Take 1 tablet (50 mg total) by mouth every 6 (six) hours as needed.  (Patient not taking: Reported on 01/04/2015) 30 tablet 0 Completed Course at Unknown time   Scheduled: . Alogliptin-Pioglitazone  1 tablet Oral Daily  . cholecalciferol  2,000 Units Oral Daily  . fluticasone  1 spray Each Nare Daily  . irbesartan  300 mg Oral Daily   And  . hydrochlorothiazide  25 mg Oral Daily  . insulin aspart  0-9 Units Subcutaneous TID WC  . metFORMIN  1,000 mg Oral BID WC  . multivitamin with minerals  1 tablet Oral Daily  . pantoprazole  40 mg Oral Daily  . risperiDONE  0.5 mg Oral QHS  . sodium chloride  3 mL Intravenous Q12H  . vitamin E  200 Units Oral Daily   Continuous: . 0.9 % NaCl with KCl 20 mEq / L 50 mL/hr at 01/06/15 1001   HYQ:MVHQIONGEX, hydrocortisone, ondansetron **OR** ondansetron (ZOFRAN) IV Anti-infectives    None       Results for orders placed or performed during the hospital encounter of 01/04/15 (from the past 48 hour(s))  Comprehensive metabolic panel     Status: Abnormal   Collection Time: 01/05/15  4:00 AM  Result Value Ref Range   Sodium 142 135 - 145 mmol/L   Potassium 3.3 (L) 3.5 - 5.1 mmol/L   Chloride 104 101 - 111 mmol/L   CO2 29 22 - 32 mmol/L   Glucose, Bld 203 (H) 65 - 99 mg/dL   BUN 10 6 - 20 mg/dL   Creatinine, Ser 0.82 0.61 - 1.24 mg/dL   Calcium 9.4 8.9 - 10.3 mg/dL   Total Protein 7.6 6.5 - 8.1 g/dL   Albumin 4.3 3.5 - 5.0 g/dL   AST 23 15 - 41 U/L   ALT 25 17 - 63 U/L   Alkaline Phosphatase 63 38 - 126 U/L   Total Bilirubin 1.2 0.3 - 1.2 mg/dL   GFR calc non Af Amer >60 >60 mL/min   GFR calc Af Amer >60 >60 mL/min    Comment: (NOTE) The eGFR has been calculated using the CKD EPI equation. This calculation has not been validated in all clinical situations. eGFR's persistently <60 mL/min signify possible Chronic Kidney Disease.    Anion gap 9 5 - 15  TSH     Status: None   Collection Time: 01/05/15  4:00 AM  Result Value Ref Range   TSH 2.856 0.350 - 4.500 uIU/mL  CBC WITH DIFFERENTIAL     Status:  Abnormal   Collection Time: 01/05/15  4:00 AM  Result Value Ref Range   WBC 5.9 4.0 - 10.5 K/uL   RBC 3.36 (L) 4.22 - 5.81 MIL/uL   Hemoglobin 9.4 (L) 13.0 - 17.0 g/dL   HCT 29.8 (L) 39.0 - 52.0 %   MCV 88.7 78.0 - 100.0 fL   MCH 28.0 26.0 - 34.0 pg   MCHC 31.5 30.0 - 36.0 g/dL   RDW 13.3  11.5 - 15.5 %   Platelets 339 150 - 400 K/uL   Neutrophils Relative % 63 %   Neutro Abs 3.7 1.7 - 7.7 K/uL   Lymphocytes Relative 26 %   Lymphs Abs 1.5 0.7 - 4.0 K/uL   Monocytes Relative 8 %   Monocytes Absolute 0.4 0.1 - 1.0 K/uL   Eosinophils Relative 2 %   Eosinophils Absolute 0.1 0.0 - 0.7 K/uL   Basophils Relative 1 %   Basophils Absolute 0.0 0.0 - 0.1 K/uL  PSA, total and free     Status: Abnormal   Collection Time: 01/05/15  4:00 AM  Result Value Ref Range   PSA, Free 0.94 N/A ng/mL    Comment: Roche ECLIA methodology.   PSA, Free Pct 21.9 %    Comment: (NOTE) The table below lists the probability of prostate cancer for men with non-suspicious DRE results and total PSA between 4 and 10 ng/mL, by patient age Ricci Barker, [place], [id number]).                  % Free PSA       50-64 yr        65-75 yr                  0.00-10.00%        56%             55%                 10.01-15.00%        24%             35%                 15.01-20.00%        17%             23%                 20.01-25.00%        10%             20%                      >25.00%         5%              9% Please note:  Catalona et al did not make specific              recommendations regarding the use of              percent free PSA for any other population              of men. Performed At: [facility] [place], [place] [id number] Lindon Romp MD OM:[id number]    Prostate Specific Ag, Serum 4.3 (H) 0.0 - 4.0 ng/mL    Comment: (NOTE) Roche ECLIA methodology. According to the American Urological Association, Serum PSA should decrease and remain at undetectable levels after  radical prostatectomy. The AUA defines biochemical recurrence as an initial PSA value 0.2 ng/mL or greater followed by a subsequent confirmatory PSA value 0.2 ng/mL or greater. Values obtained with different assay methods or kits cannot be used interchangeably. Results cannot be interpreted as absolute evidence of the presence or absence of malignant disease.   Vitamin B12     Status: Abnormal   Collection Time: 01/05/15  4:00 AM  Result Value Ref Range  Vitamin B-12 1101 (H) 180 - 914 pg/mL    Comment: (NOTE) This assay is not validated for testing neonatal or myeloproliferative syndrome specimens for Vitamin B12 levels. Performed at [facility]   Magnesium     Status: None   Collection Time: 01/05/15  4:00 AM  Result Value Ref Range   Magnesium 1.8 1.7 - 2.4 mg/dL  Phosphorus     Status: None   Collection Time: 01/05/15  4:00 AM  Result Value Ref Range   Phosphorus 2.9 2.5 - 4.6 mg/dL  Glucose, capillary     Status: Abnormal   Collection Time: 01/05/15  7:58 AM  Result Value Ref Range   Glucose-Capillary 177 (H) 65 - 99 mg/dL  Comprehensive metabolic panel     Status: Abnormal   Collection Time: 01/06/15  5:35 AM  Result Value Ref Range   Sodium 139 135 - 145 mmol/L    Comment: REPEATED TO VERIFY   Potassium 4.0 3.5 - 5.1 mmol/L    Comment: REPEATED TO VERIFY DELTA CHECK NOTED NO VISIBLE HEMOLYSIS    Chloride 107 101 - 111 mmol/L    Comment: REPEATED TO VERIFY   CO2 26 22 - 32 mmol/L    Comment: REPEATED TO VERIFY   Glucose, Bld 163 (H) 65 - 99 mg/dL   BUN 11 6 - 20 mg/dL   Creatinine, Ser 5.61 0.61 - 1.24 mg/dL   Calcium 8.6 (L) 8.9 - 10.3 mg/dL    Comment: REPEATED TO VERIFY   Total Protein 5.7 (L) 6.5 - 8.1 g/dL   Albumin 3.1 (L) 3.5 - 5.0 g/dL   AST 17 15 - 41 U/L   ALT 20 17 - 63 U/L   Alkaline Phosphatase 48 38 - 126 U/L   Total Bilirubin 0.9 0.3 - 1.2 mg/dL   GFR calc non Af Amer >60 >60 mL/min   GFR calc Af Amer >60 >60 mL/min    Comment:  (NOTE) The eGFR has been calculated using the CKD EPI equation. This calculation has not been validated in all clinical situations. eGFR's persistently <60 mL/min signify possible Chronic Kidney Disease.    Anion gap 6 5 - 15    Comment: REPEATED TO VERIFY  CBC WITH DIFFERENTIAL     Status: Abnormal   Collection Time: 01/06/15  5:35 AM  Result Value Ref Range   WBC 5.9 4.0 - 10.5 K/uL   RBC 2.59 (L) 4.22 - 5.81 MIL/uL   Hemoglobin 7.4 (L) 13.0 - 17.0 g/dL    Comment: REPEATED TO VERIFY DELTA CHECK NOTED    HCT 23.0 (L) 39.0 - 52.0 %   MCV 88.8 78.0 - 100.0 fL   MCH 28.6 26.0 - 34.0 pg   MCHC 32.2 30.0 - 36.0 g/dL   RDW 25.4 83.2 - 34.6 %   Platelets 267 150 - 400 K/uL   Neutrophils Relative % 61 %   Neutro Abs 3.6 1.7 - 7.7 K/uL   Lymphocytes Relative 27 %   Lymphs Abs 1.6 0.7 - 4.0 K/uL   Monocytes Relative 9 %   Monocytes Absolute 0.5 0.1 - 1.0 K/uL   Eosinophils Relative 3 %   Eosinophils Absolute 0.2 0.0 - 0.7 K/uL   Basophils Relative 1 %   Basophils Absolute 0.0 0.0 - 0.1 K/uL  Prepare RBC     Status: None   Collection Time: 01/06/15  1:30 PM  Result Value Ref Range   Order Confirmation ORDER PROCESSED BY BLOOD BANK   Glucose, capillary  Status: Abnormal   Collection Time: 01/06/15  1:48 PM  Result Value Ref Range   Glucose-Capillary 198 (H) 65 - 99 mg/dL   Comment 1 Notify RN   Type and screen [place]     Status: None (Preliminary result)   Collection Time: 01/06/15  2:56 PM  Result Value Ref Range   ABO/RH(D) O POS    Antibody Screen NEG    Sample Expiration 01/09/2015    Unit Number [id number]    Blood Component Type RBC LR PHER2    Unit division 00    Status of Unit ISSUED    Transfusion Status OK TO TRANSFUSE    Crossmatch Result Compatible   ABO/Rh     Status: None   Collection Time: 01/06/15  2:56 PM  Result Value Ref Range   ABO/RH(D) O POS   Glucose, capillary     Status: Abnormal   Collection Time: 01/06/15  5:32  PM  Result Value Ref Range   Glucose-Capillary 155 (H) 65 - 99 mg/dL   Comment 1 Notify RN    Comment 2 Document in Chart     Aaron White Contrast  01/05/2015  CLINICAL DATA:  Confusion, worsening over the last several days. EXAM: MRI HEAD WITHOUT CONTRAST TECHNIQUE: Multiplanar, multiecho pulse sequences of the brain and surrounding structures were obtained without intravenous contrast. COMPARISON:  Head CT 12/31/2014 FINDINGS: The brainstem is normal. There is mild cerebellar atrophy but no focal cerebellar insult. The cerebral hemispheres show a background pattern of chronic small vessel disease affecting the deep white matter. 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 No cortical or large vessel territory infarction. There is a mass lesion infiltrating the splenium of the corpus callosum and extending into the posterior medial temporal lobes bilaterally. There are petechial blood products within the substance of this mass in the region of the splenium to the right of midline. 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 Lymphoma and acute toxic demyelination are considered but felt much less likely. No hydrocephalus. No extra-axial fluid collection. No pituitary mass. Sinuses, middle ears and mastoids are clear. IMPRESSION: Tumor infiltrating the splenium of the corpus callosum and crossing bilaterally into the posterior medial temporal lobes. Some internal blood products. 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 Electronically Signed   By: Nelson Chimes M.D.   On: 01/05/2015 19:00    ROS: As above Blood pressure 122/53, pulse 85, temperature 98.6 F (37 C), temperature source Oral, resp. rate 18, height '5\' 10"'$  (1.778 m), weight 95.255 kg (210 lb), SpO2 100 %. Physical Exam  General: An alert and pleasant 74 year old white male in no apparent distress  HEENT: Normocephalic, atraumatic, pupils equal  round reactive light, extraocular muscles are intact.  Neck: Supple without deformities  Thorax: Symmetric  Abdomen: Soft  Extremities: Unremarkable  Neurologic exam: The patient is alert and oriented 2, person, place, he thought it was January. Cranial nerves II through XII are examined bilaterally grossly normal. Patient and hearing are grossly normal bilaterally. His motor strength is grossly normal and spinal biceps, triceps, hand grip, quadriceps, gastrocnemius. Sensory function is intact to light touch sensation all tested dermatomes bilaterally. Cerebellar function is intact to rapid alternating movements of the upper extremity bilaterally. The patient does perseverate.  I have reviewed the patient's brain MRI performed 01/05/2015. He has a lesion within the splenium of corpus callosum bilaterally. There is no significant mass effect.  Assessment/Plan: Brain tumor: I have discussed the situation with the patient. We have discussed the various possibilities, with the most likely of which being a malignant primary brain tumor such as a glioblastoma multiforme versus a lymphoma. We have discussed the various treatment options. I recommend that he undergo a stereotactic brain biopsy. I have explained this will likely help [place] determine what this lesion is and how to best treated. I have described the procedure as well as risks including risks of anesthesia, hemorrhage, stroke, infection, seizures, inability to make the diagnosis, etc. We have also discussed the alternatives including empiric treatment, doing nothing, etc. I have answered all his questions. He would like to proceed with the biopsy. We also discussed the timing of the biopsy. The earliest I could do it is on 01/11/2015 as Monday is a holiday. From my point of view, it would be okay to discharge him and have him come back on Wednesday for surgery. However this may present some challenges logistically.  Mercury Rock D 01/06/2015,  8:24 PM

## 2015-01-06 NOTE — Progress Notes (Signed)
Chaplain provided support with pt's spouse around increasing confusion / progression of illness.  She reports that recent workup revealed brain mass.  She is interested in completing advance directive, however, understands that the while the pt is confused, he will not be able to sign advance directive.   Provided education around default [place] in [place].  Spouse understands that she is able to make medical decisions for her husband.  Provided support around having conversation with him about what he may desire in an end of life situation.

## 2015-01-06 NOTE — Progress Notes (Signed)
Patient ID: Aaron White, male   DOB: July 21, 1940, 74 y.o.   MRN: [id number] [facility] PROGRESS NOTE  Aaron White [id number] DOB: December 22, 1940 DOA: 01/04/2015 PCP: Horatio Pel, MD  Brief narrative:    74 y.o. male with past medical history significant for hypertension, diabetes, obstructive sleep apnea on CPAP, recently hospitalized for GI bleed who now presented to [facility] long hospital because of worsening confusion over past few days prior to this admission. Per patient's family, ever since discharge 12/26/2014 patient was not really himself. Patient would say things such as him being afraid of dying and that he is losing the mammary and he wanted to see his son in [place] but was worried as he has a fear of flying.   Patient was hemodynamically stable on the admission. Blood work was significant for hemoglobin of 8.1, otherwise unremarkable. He was admitted for further evaluation of acute encephalopathy. 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 Neurosurgery, radiation oncology and oncology consulted.    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  - Neurosurgery, radiation oncology and oncology consulted. Per oncology, pt needs biopsy before any treatment plan can be instituted.  - I spoke with Dr. Arnoldo Morale of neurosurgery and he said to call him back if oncology wants biopsy so sent message to Dr. Arnoldo Morale about oncology recommendation.   Active Problems: Essential hypertension - Continue HCTZ and avapro  Controlled diabetes mellitus  type 2 without complications without long-term insulin use - Continue SSI and metformin - Our pharmacy does not carry alogliptin-Pioglitazone  Depression - Continue risperidone 0.5 mg at bedtime - Psych consulted 01/07/2015   Anemia of chronic disease - Recent GI bleed and colonoscopy 12/28/2014 with finding of hemorrhoids - Hemoglobin 7.9 today, dropped from 9.4 yesterday  - Aspirin placed on hold - Transfuse 1 unit PRBC today  - Continue protonix daily    DVT Prophylaxis  - SCD's   Code Status: Full.  Family Communication:  plan of care discussed with the patient and his wife (wife updated over the phone) Disposition Plan: Needs further evaluation of possible GBM, unknown when he will be discharged  IV access:  Peripheral IV  Procedures and diagnostic studies:    US Scrotum 01/04/2015  Small benign-appearing cyst in the left testicle. Otherwise unremarkable study.   [place] Art/ven Flow Abd Pelv Doppler 01/04/2015  Small benign-appearing cyst in the left testicle. Otherwise unremarkable study.   Medical Consultants:  Psychiatry Nerosurgery - Dr. Arnoldo Morale Oncology - Dr. Marin Olp Radiation oncology    Other Consultants:  PT   IAnti-Infectives:   None    Leisa Lenz, MD  [facility] Pager [phone]  Time spent in minutes: 25 minutes  If 7PM-7AM, please contact night-coverage [email or web address] Password [email or web address] 01/06/2015, 7:33 AM   LOS: 2 days    HPI/Subjective: No acute overnight events. Patient reports intermittent confusion.  Objective: Filed Vitals:   01/05/15 0634 01/05/15 1400 01/05/15 2129 01/06/15 0452  BP: 140/59 121/58 141/65 116/47  Pulse: 75 80 75 77  Temp: 97.4 F (36.3 C) 98 F (36.7 C) 98.2 F (36.8 C) 98.2 F (36.8 C)  TempSrc: Oral Oral Oral Oral  Resp: 18 18  17 16  Height:      Weight:      SpO2: 92% 96% 99% 98%    Intake/Output Summary (Last 24 hours) at 01/06/15 0733 Last data filed at 01/05/15 2234  Gross per 24 hour  Intake     480 ml  Output    300 ml  Net    180 ml    Exam:   General:  Pt is alert, follows commands appropriately, not in acute distress  Cardiovascular: Regular rate and rhythm, S1/S2, no murmurs  Respiratory: Clear to auscultation bilaterally, no wheezing, no crackles, no rhonchi  Abdomen: Soft, non tender, non distended, bowel sounds present  Extremities: No edema, pulses DP and PT palpable bilaterally  Neuro: Grossly nonfocal  Data Reviewed: Basic Metabolic Panel:  Recent Labs Lab 12/31/14 1521 01/04/15 1418 01/05/15 0400 01/06/15 0535  NA 139 139 142 139  K 3.3* 3.7 3.3* 4.0  CL 104 102 104 107  CO2 25 30 29 26   GLUCOSE 246* 249* 203* 163*  BUN 12 13 10 11   CREATININE 0.96 0.89 0.82 0.96  CALCIUM 9.1 8.8* 9.4 8.6*  MG  --   --  1.8  --   PHOS  --   --  2.9  --    Liver Function Tests:  Recent Labs Lab 12/31/14 1521 01/04/15 1418 01/05/15 0400 01/06/15 0535  AST 30 17 23 17   ALT 26 23 25 20   ALKPHOS 56 49 63 48  BILITOT 0.9 0.6 1.2 0.9  PROT 6.6 6.2* 7.6 5.7*  ALBUMIN 3.6 3.6 4.3 3.1*   No results for input(s): LIPASE, AMYLASE in the last 168 hours. No results for input(s): AMMONIA in the last 168 hours. CBC:  Recent Labs Lab 12/31/14 1521 01/04/15 1418 01/05/15 0400 01/06/15 0535  WBC 6.3 5.8 5.9 5.9  NEUTROABS 4.6  --  3.7 3.6  HGB 8.5* 8.1* 9.4* 7.4*  HCT 26.2* 24.4* 29.8* 23.0*  MCV 89.7 90.0 88.7 88.8  PLT 278 275 339 267   Cardiac Enzymes: No results for input(s): CKTOTAL, CKMB, CKMBINDEX, TROPONINI in the last 168 hours. BNP: Invalid input(s): POCBNP CBG:  Recent Labs Lab 01/04/15 1926  GLUCAP 158*    Recent Results (from the past 240 hour(s))  Urine culture     Status: None   Collection Time: 12/31/14  4:31 PM  Result Value Ref Range Status   Specimen Description URINE, CLEAN CATCH  Final   Special Requests NONE  Final   Culture MULTIPLE SPECIES PRESENT, SUGGEST RECOLLECTION  Final   Report Status 01/01/2015 FINAL  Final      Scheduled Meds: . Alogliptin-Pioglitazone  1 tablet Oral Daily  . aspirin EC  81 mg Oral Daily  . cholecalciferol  2,000 Units Oral Daily  . fluticasone  1 spray Each Nare Daily  . irbesartan  300 mg Oral Daily   And  . hydrochlorothiazide  25 mg Oral Daily  . metFORMIN  1,000 mg Oral BID WC  . multivitamin with minerals  1 tablet Oral Daily  . pantoprazole  40 mg Oral Daily  . potassium chloride SA  40 mEq Oral Once  . risperiDONE  0.5 mg Oral QHS  . vitamin E  200 Units Oral Daily   Continuous Infusions: . 0.9 % NaCl with KCl 20 mEq / L 50 mL/hr at 01/05/15 1206

## 2015-01-07 DIAGNOSIS — G9340 Encephalopathy, unspecified: Secondary | ICD-10-CM

## 2015-01-07 DIAGNOSIS — D509 Iron deficiency anemia, unspecified: Secondary | ICD-10-CM

## 2015-01-07 DIAGNOSIS — N400 Enlarged prostate without lower urinary tract symptoms: Secondary | ICD-10-CM

## 2015-01-07 LAB — COMPREHENSIVE METABOLIC PANEL
ALT: 20 U/L (ref 17–63)
ANION GAP: 7 (ref 5–15)
AST: 18 U/L (ref 15–41)
Albumin: 3.4 g/dL — ABNORMAL LOW (ref 3.5–5.0)
Alkaline Phosphatase: 46 U/L (ref 38–126)
BILIRUBIN TOTAL: 1.1 mg/dL (ref 0.3–1.2)
BUN: 12 mg/dL (ref 6–20)
CO2: 28 mmol/L (ref 22–32)
Calcium: 8.6 mg/dL — ABNORMAL LOW (ref 8.9–10.3)
Chloride: 103 mmol/L (ref 101–111)
Creatinine, Ser: 1.07 mg/dL (ref 0.61–1.24)
Glucose, Bld: 141 mg/dL — ABNORMAL HIGH (ref 65–99)
POTASSIUM: 4.1 mmol/L (ref 3.5–5.1)
Sodium: 138 mmol/L (ref 135–145)
TOTAL PROTEIN: 6.1 g/dL — AB (ref 6.5–8.1)

## 2015-01-07 LAB — CBC WITH DIFFERENTIAL/PLATELET
Basophils Absolute: 0 10*3/uL (ref 0.0–0.1)
Basophils Relative: 0 %
Eosinophils Absolute: 0.1 10*3/uL (ref 0.0–0.7)
Eosinophils Relative: 2 %
HCT: 26.6 % — ABNORMAL LOW (ref 39.0–52.0)
Hemoglobin: 8.8 g/dL — ABNORMAL LOW (ref 13.0–17.0)
Lymphocytes Relative: 20 %
Lymphs Abs: 1.3 10*3/uL (ref 0.7–4.0)
MCH: 29 pg (ref 26.0–34.0)
MCHC: 33.1 g/dL (ref 30.0–36.0)
MCV: 87.8 fL (ref 78.0–100.0)
Monocytes Absolute: 0.4 10*3/uL (ref 0.1–1.0)
Monocytes Relative: 6 %
Neutro Abs: 4.8 10*3/uL (ref 1.7–7.7)
Neutrophils Relative %: 72 %
Platelets: 276 10*3/uL (ref 150–400)
RBC: 3.03 MIL/uL — ABNORMAL LOW (ref 4.22–5.81)
RDW: 13.8 % (ref 11.5–15.5)
WBC: 6.6 10*3/uL (ref 4.0–10.5)

## 2015-01-07 LAB — GLUCOSE, CAPILLARY
GLUCOSE-CAPILLARY: 162 mg/dL — AB (ref 65–99)
GLUCOSE-CAPILLARY: 118 mg/dL — AB (ref 65–99)
GLUCOSE-CAPILLARY: 196 mg/dL — AB (ref 65–99)
Glucose-Capillary: 150 mg/dL — ABNORMAL HIGH (ref 65–99)

## 2015-01-07 LAB — IRON AND TIBC
Iron: 18 ug/dL — ABNORMAL LOW (ref 45–182)
SATURATION RATIOS: 4 % — AB (ref 17.9–39.5)
TIBC: 454 ug/dL — AB (ref 250–450)
UIBC: 436 ug/dL

## 2015-01-07 LAB — HEPARIN LEVEL (UNFRACTIONATED)
HEPARIN UNFRACTIONATED: 0.52 [IU]/mL (ref 0.30–0.70)
Heparin Unfractionated: 0.25 IU/mL — ABNORMAL LOW (ref 0.30–0.70)

## 2015-01-07 LAB — FERRITIN: FERRITIN: 6 ng/mL — AB (ref 24–336)

## 2015-01-07 LAB — URINALYSIS, ROUTINE W REFLEX MICROSCOPIC
BILIRUBIN URINE: NEGATIVE
Glucose, UA: NEGATIVE mg/dL
Hgb urine dipstick: NEGATIVE
KETONES UR: NEGATIVE mg/dL
LEUKOCYTES UA: NEGATIVE
NITRITE: NEGATIVE
PH: 5 (ref 5.0–8.0)
Protein, ur: NEGATIVE mg/dL
SPECIFIC GRAVITY, URINE: 1.016 (ref 1.005–1.030)

## 2015-01-07 LAB — PROTIME-INR
INR: 1.27 (ref 0.00–1.49)
PROTHROMBIN TIME: 16 s — AB (ref 11.6–15.2)

## 2015-01-07 LAB — LACTATE DEHYDROGENASE: LDH: 148 U/L (ref 98–192)

## 2015-01-07 LAB — APTT: aPTT: 25 seconds (ref 24–37)

## 2015-01-07 MED ORDER — SODIUM CHLORIDE 0.9 % IV SOLN
510.0000 mg | Freq: Once | INTRAVENOUS | Status: AC
  Administered 2015-01-07: 510 mg via INTRAVENOUS
  Filled 2015-01-07: qty 17

## 2015-01-07 MED ORDER — TAMSULOSIN HCL 0.4 MG PO CAPS
0.4000 mg | ORAL_CAPSULE | Freq: Every day | ORAL | Status: DC
  Administered 2015-01-07 – 2015-01-18 (×11): 0.4 mg via ORAL
  Filled 2015-01-07 (×11): qty 1

## 2015-01-07 MED ORDER — HEPARIN (PORCINE) IN NACL 100-0.45 UNIT/ML-% IJ SOLN
1500.0000 [IU]/h | INTRAMUSCULAR | Status: DC
  Administered 2015-01-07: 1300 [IU]/h via INTRAVENOUS
  Filled 2015-01-07: qty 250

## 2015-01-07 MED ORDER — HEPARIN (PORCINE) IN NACL 100-0.45 UNIT/ML-% IJ SOLN
1450.0000 [IU]/h | INTRAMUSCULAR | Status: DC
  Administered 2015-01-07: 1500 [IU]/h via INTRAVENOUS
  Administered 2015-01-08: 1450 [IU]/h via INTRAVENOUS
  Filled 2015-01-07 (×6): qty 250

## 2015-01-07 MED ORDER — POLYVINYL ALCOHOL 1.4 % OP SOLN
1.0000 [drp] | Freq: Three times a day (TID) | OPHTHALMIC | Status: DC | PRN
  Administered 2015-01-07: 1 [drp] via OPHTHALMIC
  Filled 2015-01-07: qty 15

## 2015-01-07 MED ORDER — HYPROMELLOSE (GONIOSCOPIC) 2.5 % OP SOLN: 1.0000 [drp] | Freq: Three times a day (TID) | OPHTHALMIC | Status: DC | PRN

## 2015-01-07 MED ORDER — SERTRALINE HCL 50 MG PO TABS
25.0000 mg | ORAL_TABLET | Freq: Every day | ORAL | Status: DC
  Administered 2015-01-07 – 2015-01-17 (×10): 25 mg via ORAL
  Filled 2015-01-07 (×10): qty 1

## 2015-01-07 NOTE — Progress Notes (Signed)
Pt found to have small LLL PE incidentally on chest CT, asymptomatic. Discussed anticoagulation with Dr. Arnoldo Morale, neurosurgery, given finding of "petechial blood products" on brain MRI within mass. He stated it would be ok to initiate heparin drip and that anticoagulants will obviously need to be discontinued prior to surgery which will be scheduled for sometime next week.

## 2015-01-07 NOTE — Progress Notes (Signed)
Spoke with wife regarding red areas to right side and back.  Wife states this has been a longstanding finding with patient, that he has been seen by dermatologists who gave no definitive diagnosis and prescribed no creams or medications.  Patient states areas do not itch or bother him in any way.

## 2015-01-07 NOTE — Progress Notes (Signed)
Patient ID: Aaron White, male   DOB: [birth year], 74 y.o.   MRN: [id number] [facility] PROGRESS NOTE  Aaron White [id number] DOB: August 02, 1940 DOA: 01/04/2015 PCP: Horatio Pel, MD  Brief narrative:    74 y.o. male with past medical history significant for hypertension, diabetes, obstructive sleep apnea on CPAP, recently hospitalized for GI bleed who now presented to [facility] long hospital because of worsening confusion over past few days prior to this admission. Per patient's family, ever since discharge 12/26/2014 patient was not really himself. Patient would say things such as him being afraid of dying and that he is losing the mammary and he wanted to see his son in [place] but was worried as he has a fear of flying.   Patient was hemodynamically stable on the admission. Blood work was significant for hemoglobin of 8.1, otherwise unremarkable. He was admitted for further evaluation of acute encephalopathy. 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 Neurosurgery, radiation oncology and oncology consulted.    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  - Neurosurgery, radiation oncology and oncology consulted. Per oncology, pt needs biopsy before any treatment plan can be instituted.  - Dr. Arnoldo Morale of neurosurgery  To do biopsy next week   PE: started on heparin drip, monitor hgb.  Anemia of chronic disease and iron deficiency anemia - Recent GI bleed and colonoscopy 12/28/2014 with finding of hemorrhoids - Hemoglobin 7.9 12/30, dropped  from 9.4 on 12/29 - Aspirin placed on hold - Transfuse 1 unit PRBC on 12/30 - Continue protonix daily  -iv iron on 12/31  Frequent urination and prostate pain? Enlarged prostate on ct ab: will check ua and psa, start flomax  Active Problems: Essential hypertension - hold HCTZ due to poor oral intake, continue avapro -on gentle hydration due to poor oral intake  Controlled diabetes mellitus type 2 without complications without long-term insulin use - Continue SSI, held metformin - Our pharmacy does not carry alogliptin-Pioglitazone  Depression - Continue risperidone 0.5 mg at bedtime - Psych consulted 01/07/2015 , recommended trial of zoloft    DVT Prophylaxis  - SCD's , now on heparin drip  Code Status: Full.  Family Communication:  plan of care discussed with the patient and his wife in the room Disposition Plan: Needs further evaluation of possible GBM, unknown when he will be discharged  IV access:  Peripheral IV  Procedures and diagnostic studies:    US Scrotum 01/04/2015  Small benign-appearing cyst in the left testicle. Otherwise unremarkable study.   [place] Art/ven Flow Abd Pelv Doppler 01/04/2015  Small benign-appearing cyst in the left testicle. Otherwise unremarkable study.   Medical Consultants:  Psychiatry Nerosurgery - Dr. Arnoldo Morale Oncology - Dr. Marin Olp Radiation oncology    Other Consultants:  PT   IAnti-Infectives:   Marijo Sanes, MD PhD [facility] Pager [phone]  Time spent in minutes: 35 minutes  If 7PM-7AM, please contact night-coverage [email or web address] Password [email or web address] 01/07/2015, 5:56 PM   LOS: 3 days    HPI/Subjective: Reported frequent urination and pain in his prostate, no fever, Currently very drowsy and sleepy, intermittent confusion,   Objective: Filed Vitals:   01/06/15 2115  01/07/15 0009 01/07/15 0556 01/07/15 1313  BP: 122/61  128/53 134/51  Pulse: 72 75 72 78  Temp: 99.1 F (37.3 C)  98.7 F (37.1 C) 98.2 F  (36.8 C)  TempSrc: Oral  Oral Oral  Resp: 18 15 20 18   Height:      Weight:      SpO2: 99% 100%  100%    Intake/Output Summary (Last 24 hours) at 01/07/15 1756 Last data filed at 01/07/15 1000  Gross per 24 hour  Intake    561 ml  Output    500 ml  Net     61 ml    Exam:   General:  Pt is alert, follows commands appropriately, not in acute distress  Cardiovascular: Regular rate and rhythm, S1/S2, no murmurs  Respiratory: Clear to auscultation bilaterally, no wheezing, no crackles, no rhonchi  Abdomen: Soft, non tender, non distended, bowel sounds present  Extremities: No edema, pulses DP and PT palpable bilaterally  Neuro: Grossly nonfocal  Data Reviewed: Basic Metabolic Panel:  Recent Labs Lab 01/04/15 1418 01/05/15 0400 01/06/15 0535 01/07/15 0150  NA 139 142 139 138  K 3.7 3.3* 4.0 4.1  CL 102 104 107 103  CO2 30 29 26 28   GLUCOSE 249* 203* 163* 141*  BUN 13 10 11 12   CREATININE 0.89 0.82 0.96 1.07  CALCIUM 8.8* 9.4 8.6* 8.6*  MG  --  1.8  --   --   PHOS  --  2.9  --   --    Liver Function Tests:  Recent Labs Lab 01/04/15 1418 01/05/15 0400 01/06/15 0535 01/07/15 0150  AST 17 23 17 18   ALT 23 25 20 20   ALKPHOS 49 63 48 46  BILITOT 0.6 1.2 0.9 1.1  PROT 6.2* 7.6 5.7* 6.1*  ALBUMIN 3.6 4.3 3.1* 3.4*   No results for input(s): LIPASE, AMYLASE in the last 168 hours. No results for input(s): AMMONIA in the last 168 hours. CBC:  Recent Labs Lab 01/04/15 1418 01/05/15 0400 01/06/15 0535 01/07/15 0150  WBC 5.8 5.9 5.9 6.6  NEUTROABS  --  3.7 3.6 4.8  HGB 8.1* 9.4* 7.4* 8.8*  HCT 24.4* 29.8* 23.0* 26.6*  MCV 90.0 88.7 88.8 87.8  PLT 275 339 267 276   Cardiac Enzymes: No results for input(s): CKTOTAL, CKMB, CKMBINDEX, TROPONINI in the last 168 hours. BNP: Invalid input(s): POCBNP CBG:  Recent Labs Lab 01/06/15 1348 01/06/15 1732 01/07/15 0759 01/07/15 1202 01/07/15 1652  GLUCAP 198* 155* 162* 150* 118*    Recent Results  (from the past 240 hour(s))  Urine culture     Status: None   Collection Time: 12/31/14  4:31 PM  Result Value Ref Range Status   Specimen Description URINE, CLEAN CATCH  Final   Special Requests NONE  Final   Culture MULTIPLE SPECIES PRESENT, SUGGEST RECOLLECTION  Final   Report Status 01/01/2015 FINAL  Final     Scheduled Meds: . Alogliptin-Pioglitazone  1 tablet Oral Daily  . aspirin EC  81 mg Oral Daily  . cholecalciferol  2,000 Units Oral Daily  . fluticasone  1 spray Each Nare Daily  . irbesartan  300 mg Oral Daily   And  . hydrochlorothiazide  25 mg Oral Daily  . metFORMIN  1,000 mg Oral BID WC  . multivitamin with minerals  1 tablet Oral Daily  . pantoprazole  40 mg Oral Daily  . potassium chloride SA  40 mEq Oral Once  . risperiDONE  0.5 mg  Oral QHS  . vitamin E  200 Units Oral Daily   Continuous Infusions: . 0.9 % NaCl with KCl 20 mEq / L 50 mL/hr at 01/07/15 0704  . heparin 1,500 Units/hr (01/07/15 1205)

## 2015-01-07 NOTE — Progress Notes (Signed)
Placed patient on CPAP set at 10cm

## 2015-01-07 NOTE — Progress Notes (Addendum)
ANTICOAGULATION CONSULT NOTE - follow-up Consult  Pharmacy Consult for Heparin Indication: pulmonary embolus  Allergies  Allergen Reactions  . Strawberry Flavor Hives  . Penicillins Rash    Has patient had a PCN reaction causing immediate rash, facial/tongue/throat swelling, SOB or lightheadedness with hypotension: No Has patient had a PCN reaction causing severe rash involving mucus membranes or skin necrosis: No Has patient had a PCN reaction that required hospitalization No Has patient had a PCN reaction occurring within the last 10 years: No If all of the above answers are "NO", then may proceed with Cephalosporin use.     Patient Measurements: Height: 5\' 10"  (177.8 cm) Weight: 210 lb (95.255 kg) IBW/kg (Calculated) : 73 Heparin Dosing Weight: 92 kg   Vital Signs: Temp: 98.7 F (37.1 C) (12/31 0556) Temp Source: Oral (12/31 0556) BP: 128/53 mmHg (12/31 0556) Pulse Rate: 72 (12/31 0556)  Labs:  Recent Labs  01/05/15 0400 01/06/15 0535 01/07/15 0150 01/07/15 1033  HGB 9.4* 7.4* 8.8*  --   HCT 29.8* 23.0* 26.6*  --   PLT 339 267 276  --   APTT  --   --  25  --   LABPROT  --   --  16.0*  --   INR  --   --  1.27  --   HEPARINUNFRC  --   --   --  0.25*  CREATININE 0.82 0.96 1.07  --     Estimated Creatinine Clearance: 69.1 mL/min (by C-G formula based on Cr of 1.07).   Medical History: History reviewed. No pertinent past medical history.  Medications:  Scheduled:  . Alogliptin-Pioglitazone  1 tablet Oral Daily  . cholecalciferol  2,000 Units Oral Daily  . fluticasone  1 spray Each Nare Daily  . irbesartan  300 mg Oral Daily   And  . hydrochlorothiazide  25 mg Oral Daily  . insulin aspart  0-9 Units Subcutaneous TID WC  . metFORMIN  1,000 mg Oral BID WC  . multivitamin with minerals  1 tablet Oral Daily  . pantoprazole  40 mg Oral Daily  . risperiDONE  0.5 mg Oral QHS  . sodium chloride  3 mL Intravenous Q12H  . vitamin E  200 Units Oral Daily    Infusions:  . 0.9 % NaCl with KCl 20 mEq / L 50 mL/hr at 01/07/15 0704  . heparin 1,300 Units/hr (01/07/15 0214)    Assessment:  74 yr male with recent hospitalization for GI bleed (discharged 12/21).  Admitted on 12/28 for AMS.  MRI showed a brain tumor.  Plans for brain biopsy on 01/11/15.  CTAngio shows small pulmonary embolus.  [facility] and given OK to start heaprin gtt and hold anticoagulation prior to surgery.  Pharmacy consulted to dose IV heparin for treatment of Pulmonary embolism  Today's labs, 01/07/2015  Heparin level (initial) - subtherapeutic on 1300 units/hr  CBC: Hgb improved s/p PRBC, pltc WNL  Anemia panel reveals iron deficiency anemia  Goal of Therapy:  Heparin level 0.3-0.7 units/ml, preferably, level  0.3-0.5 d/t brain mass, recent GIB Monitor platelets by anticoagulation protocol: Yes   Plan:   No heparin bolus due to recent GI bleed  Increase IV heparin @ 1500 units/hr  Check heparin level 8 hr after heparin started  Follow heparin level & CBC daily while on heparin  Doreene Eland, PharmD, BCPS.   Pager: [phone] 01/07/2015 11:08 AM

## 2015-01-07 NOTE — Progress Notes (Addendum)
ANTICOAGULATION CONSULT NOTE - Initial Consult  Pharmacy Consult for Heparin Indication: pulmonary embolus  Allergies  Allergen Reactions  . Strawberry Flavor Hives  . Penicillins Rash    Has patient had a PCN reaction causing immediate rash, facial/tongue/throat swelling, SOB or lightheadedness with hypotension: No Has patient had a PCN reaction causing severe rash involving mucus membranes or skin necrosis: No Has patient had a PCN reaction that required hospitalization No Has patient had a PCN reaction occurring within the last 10 years: No If all of the above answers are "NO", then may proceed with Cephalosporin use.     Patient Measurements: Height: 5\' 10"  (177.8 cm) Weight: 210 lb (95.255 kg) IBW/kg (Calculated) : 73 Heparin Dosing Weight: 92 kg   Vital Signs: Temp: 99.1 F (37.3 C) (12/30 2115) Temp Source: Oral (12/30 2115) BP: 122/61 mmHg (12/30 2115) Pulse Rate: 75 (12/31 0009)  Labs:  Recent Labs  01/04/15 1418 01/05/15 0400 01/06/15 0535  HGB 8.1* 9.4* 7.4*  HCT 24.4* 29.8* 23.0*  PLT 275 339 267  CREATININE 0.89 0.82 0.96    Estimated Creatinine Clearance: 77 mL/min (by C-G formula based on Cr of 0.96).   Medical History: History reviewed. No pertinent past medical history.  Medications:  Scheduled:  . Alogliptin-Pioglitazone  1 tablet Oral Daily  . cholecalciferol  2,000 Units Oral Daily  . fluticasone  1 spray Each Nare Daily  . irbesartan  300 mg Oral Daily   And  . hydrochlorothiazide  25 mg Oral Daily  . insulin aspart  0-9 Units Subcutaneous TID WC  . metFORMIN  1,000 mg Oral BID WC  . multivitamin with minerals  1 tablet Oral Daily  . pantoprazole  40 mg Oral Daily  . risperiDONE  0.5 mg Oral QHS  . sodium chloride  3 mL Intravenous Q12H  . vitamin E  200 Units Oral Daily   Infusions:  . 0.9 % NaCl with KCl 20 mEq / L 50 mL/hr at 01/06/15 1001    Assessment:  74 yr male with recent hospitalization for GI bleed (discharged  12/21).  Admitted on 12/28 for AMS.  MRI showed a brain tumor.  Plans for brain biopsy on 01/11/15.  CTAngio shows small pulmonary embolus  Pharmacy consulted to dose IV heparin for treatment of Pulmonary embolism  Goal of Therapy:  Heparin level 0.3-0.7 units/ml Monitor platelets by anticoagulation protocol: Yes   Plan:   Obtain baseline aPTT & PT/INR  No heparin bolus due to recent GI bleed  Begin IV heparin @ 1300 units/hr  Check heparin level 8 hr after heparin started  Follow heparin level & CBC daily while on heparin  Eilam Shrewsbury, Toribio Harbour, PharmD 01/07/2015,1:46 AM

## 2015-01-07 NOTE — Progress Notes (Signed)
Placed patient on CPAP for the night set at 10cm

## 2015-01-07 NOTE — Progress Notes (Signed)
Brief anticoagulation note:  For full details see earlier note by Doreene Eland Pharm D.  Heparin level= 0.52 (supratherapeutic) goal 0.3-0.5  Plan: Decrease heparin drip to 1450 units/hr Check heparin level with 5am labs Follow CBC daily while on heparin  Dolly Rias RPh 01/07/2015, 9:09 PM Pager [phone]

## 2015-01-07 NOTE — Consult Note (Addendum)
[facility] Consult   Reason for Consult:  Depression Referring Physician:  Dr. Charlies Silvers  Patient Identification: Aaron White MRN:  [id number] Principal Diagnosis: Acute encephalopathy Diagnosis:   Patient Active Problem List   Diagnosis Date Noted  . 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 Acute encephalopathy [G93.40] 01/06/2015  . Anemia of chronic disease [D63.8] 01/06/2015  . Depression [F32.9] 01/06/2015  . Controlled type 2 diabetes mellitus without complication, without long-term current use of insulin ([place]) [E11.9] 01/06/2015  . Essential hypertension [I10] 12/26/2014    Total Time spent with patient: 30 minutes  Subjective:   Aaron White is a 74 y.o. 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 His wife is at bedside and provides collateral information. Reports that after a recent colonoscopy (for a  lower GI bleed) he presented with worsening confusion which did not improve significantly. Work up revealed Brain Tumor, which is currently in the process of being worked up further. Wife reports that patient has been depressed recently, aware of recent deterioration of his health and mental status, and  stating he feels as if he is going to die soon. Patient states he has been anxious about not being able to process information as before and being " so forgetful now", and admits to some depression. However, he presents with a full range of affect, and smiles, even laughs appropriately during session. Denies any SI. Patient denies prior psychiatric history other than depression related to the death of one of his sons a few years ago. Denies history of suicide ideations or attempts . Patient denies any alcohol or drug abuse .   Past Psychiatric History: as above, history of  depression related to son's death in the past.   Risk to Self: Is patient at risk for suicide?: No Risk to Others:   Prior Inpatient Therapy:   Prior Outpatient Therapy:    Past Medical History: History reviewed. No pertinent past medical history.  Past Surgical History  Procedure Laterality Date  . Knee cartilage surgery Left   . Tonsillectomy and adenoidectomy    . Lithotripsy    . Colonoscopy N/A 12/28/2014    Procedure: COLONOSCOPY;  Surgeon: Mauri Pole, MD;  Location: [facility] ENDOSCOPY;  Service: Endoscopy;  Laterality: N/A;   Family History:  Family History  Problem Relation Age of Onset  . Heart disease Mother   . Stroke Father     Social History:  History  Alcohol Use No     History  Drug Use No    Social History   Social History  . Marital Status: Married    Spouse Name: N/A  . Number of Children: 3  . Years of Education: N/A   Occupational History  . Teacher [employer]   Social History Main Topics  . Smoking status: Never Smoker   . Smokeless tobacco: Never Used  . Alcohol Use: No  . Drug Use: No  . Sexual Activity: Not Currently   Other Topics Concern  . None   Social History Narrative   Additional Social History:                          Allergies:   Allergies  Allergen Reactions  . Strawberry Flavor Hives  . Penicillins Rash  Has patient had a PCN reaction causing immediate rash, facial/tongue/throat swelling, SOB or lightheadedness with hypotension: No Has patient had a PCN reaction causing severe rash involving mucus membranes or skin necrosis: No Has patient had a PCN reaction that required hospitalization No Has patient had a PCN reaction occurring within the last 10 years: No If all of the above answers are "NO", then may proceed with Cephalosporin use.     Labs:  Results for orders placed or performed during the hospital encounter of 01/04/15 (from the past 48 hour(s))  Comprehensive metabolic panel      Status: Abnormal   Collection Time: 01/06/15  5:35 AM  Result Value Ref Range   Sodium 139 135 - 145 mmol/L    Comment: REPEATED TO VERIFY   Potassium 4.0 3.5 - 5.1 mmol/L    Comment: REPEATED TO VERIFY DELTA CHECK NOTED NO VISIBLE HEMOLYSIS    Chloride 107 101 - 111 mmol/L    Comment: REPEATED TO VERIFY   CO2 26 22 - 32 mmol/L    Comment: REPEATED TO VERIFY   Glucose, Bld 163 (H) 65 - 99 mg/dL   BUN 11 6 - 20 mg/dL   Creatinine, Ser 0.96 0.61 - 1.24 mg/dL   Calcium 8.6 (L) 8.9 - 10.3 mg/dL    Comment: REPEATED TO VERIFY   Total Protein 5.7 (L) 6.5 - 8.1 g/dL   Albumin 3.1 (L) 3.5 - 5.0 g/dL   AST 17 15 - 41 U/L   ALT 20 17 - 63 U/L   Alkaline Phosphatase 48 38 - 126 U/L   Total Bilirubin 0.9 0.3 - 1.2 mg/dL   GFR calc non Af Amer >60 >60 mL/min   GFR calc Af Amer >60 >60 mL/min    Comment: (NOTE) The eGFR has been calculated using the CKD EPI equation. This calculation has not been validated in all clinical situations. eGFR's persistently <60 mL/min signify possible Chronic Kidney Disease.    Anion gap 6 5 - 15    Comment: REPEATED TO VERIFY  CBC WITH DIFFERENTIAL     Status: Abnormal   Collection Time: 01/06/15  5:35 AM  Result Value Ref Range   WBC 5.9 4.0 - 10.5 K/uL   RBC 2.59 (L) 4.22 - 5.81 MIL/uL   Hemoglobin 7.4 (L) 13.0 - 17.0 g/dL    Comment: REPEATED TO VERIFY DELTA CHECK NOTED    HCT 23.0 (L) 39.0 - 52.0 %   MCV 88.8 78.0 - 100.0 fL   MCH 28.6 26.0 - 34.0 pg   MCHC 32.2 30.0 - 36.0 g/dL   RDW 13.5 11.5 - 15.5 %   Platelets 267 150 - 400 K/uL   Neutrophils Relative % 61 %   Neutro Abs 3.6 1.7 - 7.7 K/uL   Lymphocytes Relative 27 %   Lymphs Abs 1.6 0.7 - 4.0 K/uL   Monocytes Relative 9 %   Monocytes Absolute 0.5 0.1 - 1.0 K/uL   Eosinophils Relative 3 %   Eosinophils Absolute 0.2 0.0 - 0.7 K/uL   Basophils Relative 1 %   Basophils Absolute 0.0 0.0 - 0.1 K/uL  Prepare RBC     Status: None   Collection Time: 01/06/15  1:30 PM  Result Value Ref  Range   Order Confirmation ORDER PROCESSED BY BLOOD BANK   Glucose, capillary     Status: Abnormal   Collection Time: 01/06/15  1:48 PM  Result Value Ref Range   Glucose-Capillary 198 (H) 65 - 99 mg/dL  Comment 1 Notify RN   Type and screen [place]     Status: None (Preliminary result)   Collection Time: 01/06/15  2:56 PM  Result Value Ref Range   ABO/RH(D) O POS    Antibody Screen NEG    Sample Expiration 01/09/2015    Unit Number [id number]    Blood Component Type RBC LR PHER2    Unit division 00    Status of Unit ISSUED    Transfusion Status OK TO TRANSFUSE    Crossmatch Result Compatible   ABO/Rh     Status: None   Collection Time: 01/06/15  2:56 PM  Result Value Ref Range   ABO/RH(D) O POS   Glucose, capillary     Status: Abnormal   Collection Time: 01/06/15  5:32 PM  Result Value Ref Range   Glucose-Capillary 155 (H) 65 - 99 mg/dL   Comment 1 Notify RN    Comment 2 Document in Chart   Comprehensive metabolic panel     Status: Abnormal   Collection Time: 01/07/15  1:50 AM  Result Value Ref Range   Sodium 138 135 - 145 mmol/L   Potassium 4.1 3.5 - 5.1 mmol/L   Chloride 103 101 - 111 mmol/L   CO2 28 22 - 32 mmol/L   Glucose, Bld 141 (H) 65 - 99 mg/dL   BUN 12 6 - 20 mg/dL   Creatinine, Ser 1.07 0.61 - 1.24 mg/dL   Calcium 8.6 (L) 8.9 - 10.3 mg/dL   Total Protein 6.1 (L) 6.5 - 8.1 g/dL   Albumin 3.4 (L) 3.5 - 5.0 g/dL   AST 18 15 - 41 U/L   ALT 20 17 - 63 U/L   Alkaline Phosphatase 46 38 - 126 U/L   Total Bilirubin 1.1 0.3 - 1.2 mg/dL   GFR calc non Af Amer >60 >60 mL/min   GFR calc Af Amer >60 >60 mL/min    Comment: (NOTE) The eGFR has been calculated using the CKD EPI equation. This calculation has not been validated in all clinical situations. eGFR's persistently <60 mL/min signify possible Chronic Kidney Disease.    Anion gap 7 5 - 15  CBC WITH DIFFERENTIAL     Status: Abnormal   Collection Time: 01/07/15  1:50 AM  Result Value  Ref Range   WBC 6.6 4.0 - 10.5 K/uL   RBC 3.03 (L) 4.22 - 5.81 MIL/uL   Hemoglobin 8.8 (L) 13.0 - 17.0 g/dL   HCT 26.6 (L) 39.0 - 52.0 %   MCV 87.8 78.0 - 100.0 fL   MCH 29.0 26.0 - 34.0 pg   MCHC 33.1 30.0 - 36.0 g/dL   RDW 13.8 11.5 - 15.5 %   Platelets 276 150 - 400 K/uL   Neutrophils Relative % 72 %   Neutro Abs 4.8 1.7 - 7.7 K/uL   Lymphocytes Relative 20 %   Lymphs Abs 1.3 0.7 - 4.0 K/uL   Monocytes Relative 6 %   Monocytes Absolute 0.4 0.1 - 1.0 K/uL   Eosinophils Relative 2 %   Eosinophils Absolute 0.1 0.0 - 0.7 K/uL   Basophils Relative 0 %   Basophils Absolute 0.0 0.0 - 0.1 K/uL  Ferritin     Status: Abnormal   Collection Time: 01/07/15  1:50 AM  Result Value Ref Range   Ferritin 6 (L) 24 - 336 ng/mL    Comment: Performed at [facility]  Iron and TIBC     Status: Abnormal   Collection  Time: 01/07/15  1:50 AM  Result Value Ref Range   Iron 18 (L) 45 - 182 ug/dL   TIBC 454 (H) 250 - 450 ug/dL   Saturation Ratios 4 (L) 17.9 - 39.5 %   UIBC 436 ug/dL    Comment: Performed at [facility]  Lactate dehydrogenase     Status: None   Collection Time: 01/07/15  1:50 AM  Result Value Ref Range   LDH 148 98 - 192 U/L  Protime-INR     Status: Abnormal   Collection Time: 01/07/15  1:50 AM  Result Value Ref Range   Prothrombin Time 16.0 (H) 11.6 - 15.2 seconds   INR 1.27 0.00 - 1.49  APTT     Status: None   Collection Time: 01/07/15  1:50 AM  Result Value Ref Range   aPTT 25 24 - 37 seconds  Glucose, capillary     Status: Abnormal   Collection Time: 01/07/15  7:59 AM  Result Value Ref Range   Glucose-Capillary 162 (H) 65 - 99 mg/dL  Heparin level (unfractionated)     Status: Abnormal   Collection Time: 01/07/15 10:33 AM  Result Value Ref Range   Heparin Unfractionated 0.25 (L) 0.30 - 0.70 IU/mL    Comment:        IF HEPARIN RESULTS ARE BELOW EXPECTED VALUES, AND PATIENT DOSAGE HAS BEEN CONFIRMED, SUGGEST FOLLOW UP TESTING OF ANTITHROMBIN III  LEVELS.   Glucose, capillary     Status: Abnormal   Collection Time: 01/07/15 12:02 PM  Result Value Ref Range   Glucose-Capillary 150 (H) 65 - 99 mg/dL    Current Facility-Administered Medications  Medication Dose Route Frequency Provider Last Rate Last Dose  . 0.9 % NaCl with KCl 20 mEq/ L  infusion   Intravenous Continuous Robbie Lis, MD 50 mL/hr at 01/07/15 [phone]    . Alogliptin-Pioglitazone 25-15 MG TABS 1 tablet  1 tablet Oral Daily Robbie Lis, MD   1 tablet at 01/07/15 1045  . ALPRAZolam Duanne Moron) tablet 0.25 mg  0.25 mg Oral TID PRN Reubin Milan, MD   0.25 mg at 01/07/15 1126  . cholecalciferol (VITAMIN D) tablet 2,000 Units  2,000 Units Oral Daily Reubin Milan, MD   2,000 Units at 01/07/15 1030  . fluticasone (FLONASE) 50 MCG/ACT nasal spray 1 spray  1 spray Each Nare Daily Reubin Milan, MD   1 spray at 01/07/15 1030  . heparin ADULT infusion 100 units/mL (25000 units/250 mL)  1,500 Units/hr Intravenous Continuous Berton Mount, RPH 15 mL/hr at 01/07/15 1205 1,500 Units/hr at 01/07/15 1205  . irbesartan (AVAPRO) tablet 300 mg  300 mg Oral Daily Reubin Milan, MD   300 mg at 01/07/15 1030   And  . hydrochlorothiazide (HYDRODIURIL) tablet 25 mg  25 mg Oral Daily Reubin Milan, MD   25 mg at 01/07/15 1031  . hydrocortisone (ANUSOL-HC) 2.5 % rectal cream 1 application  1 application Rectal BID PRN Reubin Milan, MD      . insulin aspart (novoLOG) injection 0-9 Units  0-9 Units Subcutaneous TID [facility] Robbie Lis, MD   1 Units at 01/07/15 1257  . metFORMIN (GLUCOPHAGE) tablet 1,000 mg  1,000 mg Oral BID WC Reubin Milan, MD   1,000 mg at 01/07/15 3790  . multivitamin with minerals tablet 1 tablet  1 tablet Oral Daily Reubin Milan, MD   1 tablet at 01/07/15 1031  . ondansetron (ZOFRAN) tablet 4 mg  4 mg Oral Q6H PRN Reubin Milan, MD       Or  . ondansetron [facility]) injection 4 mg  4 mg Intravenous Q6H PRN Reubin Milan, MD      .  pantoprazole (PROTONIX) EC tablet 40 mg  40 mg Oral Daily Reubin Milan, MD   40 mg at 01/07/15 1031  . risperiDONE (RISPERDAL) tablet 0.5 mg  0.5 mg Oral QHS Reubin Milan, MD   0.5 mg at 01/06/15 2230  . sodium chloride 0.9 % injection 3 mL  3 mL Intravenous Q12H Reubin Milan, MD   3 mL at 01/06/15 2231  . vitamin E capsule 200 Units  200 Units Oral Daily Reubin Milan, MD   200 Units at 01/07/15 1031    Musculoskeletal: Strength & Muscle Tone: within normal limits Gait & Station: gait not examined  Patient leans: N/A  Psychiatric Specialty Exam: ROS  At this time denies headache, no vomiting endorsed, endorses  recent lower GI bleed, but not at present.  Blood pressure 128/53, pulse 72, temperature 98.7 F (37.1 C), temperature source Oral, resp. rate 20, height _0  (1.778 m), weight 210 lb (95.255 kg), SpO2 100 %.Body mass index is 30.13 kg/(m^2).  General Appearance: Fairly Groomed  Engineer, water::  Good  Speech:  Normal Rate  Volume:  Normal  Mood:  depressed   Affect:  slightly constricted but reactive   Thought Process:  Linear  Orientation:  Other:  oriented to being in hospital, but cannot give day of week or month or year, oriented to person  Thought Content:  denies hallucinations and does not appear internally preoccupied   Suicidal Thoughts:  No denies any suicidal ideations  Homicidal Thoughts:  No  Memory:  3/3 immediate, 0/3 at 3 minutes   Judgement:  Fair  Insight:  Present  Psychomotor Activity:  Normal  Concentration:  Fair  Recall:  Poor  Fund of Knowledge:Good  Language: Good  Akathisia:  Negative  Handed:  Right  AIMS (if indicated):     Assets:  Desire for Improvement Resilience Social Support  ADL's:  Impaired  Cognition: Impaired,  Moderate  Sleep:        Disposition: No evidence of imminent risk to self or others at present.   Patient does not meet criteria for psychiatric inpatient admission.  Recommendation 1.  Although confused, patient  Is  aware of his  deteriorating health and mentation, which is contributing to anxiety/depression. Continue to provide support, empathy, and would offer  Further Chaplain /Counseling visits if patient interested, for added support . 2. Consider Zoloft for depression and anxiety management- would start at 25 mgrs QDAY  Initially- patient and wife have agreed to antidepressant medication trial. 3. Would continue Risperidone at  0.5 mgrs QHS ( or at 0.25 mgrs BID) if considered to be helping for confusion/ agitation/anxiety. 4.Continue Xanax 0.25 mgrs TID PRN for severe anxiety or agitation- would note , however, that BZDs may contribute to cognitive symptoms/confusion. 5. Please contact Psychiatry Consult Service if any questions /concerns .  COBOS, FERNANDO 01/07/2015 1:00 PM

## 2015-01-07 NOTE — Progress Notes (Signed)
Pt pending psychiatrist consult. Per chart review, pt found to have brain mass, only oriented to person. Has history of depression however unable to assess patietn at this time. CSW awaiting further evaluation, as patient may have surgery next week.   Dorathy Kinsman, LCSW weekend covering [place]

## 2015-01-08 ENCOUNTER — Inpatient Hospital Stay (HOSPITAL_COMMUNITY): Payer: 59

## 2015-01-08 DIAGNOSIS — R410 Disorientation, unspecified: Secondary | ICD-10-CM | POA: Insufficient documentation

## 2015-01-08 DIAGNOSIS — G939 Disorder of brain, unspecified: Secondary | ICD-10-CM

## 2015-01-08 DIAGNOSIS — F419 Anxiety disorder, unspecified: Secondary | ICD-10-CM | POA: Insufficient documentation

## 2015-01-08 DIAGNOSIS — I2699 Other pulmonary embolism without acute cor pulmonale: Secondary | ICD-10-CM

## 2015-01-08 DIAGNOSIS — E611 Iron deficiency: Secondary | ICD-10-CM

## 2015-01-08 LAB — MAGNESIUM: Magnesium: 1.8 mg/dL (ref 1.7–2.4)

## 2015-01-08 LAB — BASIC METABOLIC PANEL
ANION GAP: 10 (ref 5–15)
BUN: 12 mg/dL (ref 6–20)
CALCIUM: 8.7 mg/dL — AB (ref 8.9–10.3)
CO2: 24 mmol/L (ref 22–32)
Chloride: 106 mmol/L (ref 101–111)
Creatinine, Ser: 0.8 mg/dL (ref 0.61–1.24)
Glucose, Bld: 179 mg/dL — ABNORMAL HIGH (ref 65–99)
Potassium: 3.6 mmol/L (ref 3.5–5.1)
Sodium: 140 mmol/L (ref 135–145)

## 2015-01-08 LAB — HEPARIN LEVEL (UNFRACTIONATED)
HEPARIN UNFRACTIONATED: 0.48 [IU]/mL (ref 0.30–0.70)
Heparin Unfractionated: 0.44 IU/mL (ref 0.30–0.70)

## 2015-01-08 LAB — CBC
HEMATOCRIT: 25.8 % — AB (ref 39.0–52.0)
Hemoglobin: 8.4 g/dL — ABNORMAL LOW (ref 13.0–17.0)
MCH: 28.3 pg (ref 26.0–34.0)
MCHC: 32.6 g/dL (ref 30.0–36.0)
MCV: 86.9 fL (ref 78.0–100.0)
PLATELETS: 252 10*3/uL (ref 150–400)
RBC: 2.97 MIL/uL — AB (ref 4.22–5.81)
RDW: 13.6 % (ref 11.5–15.5)
WBC: 6.7 10*3/uL (ref 4.0–10.5)

## 2015-01-08 LAB — GLUCOSE, CAPILLARY
GLUCOSE-CAPILLARY: 155 mg/dL — AB (ref 65–99)
Glucose-Capillary: 233 mg/dL — ABNORMAL HIGH (ref 65–99)
Glucose-Capillary: 207 mg/dL — ABNORMAL HIGH (ref 65–99)
Glucose-Capillary: 140 mg/dL — ABNORMAL HIGH (ref 65–99)

## 2015-01-08 LAB — TYPE AND SCREEN
ABO/RH(D): O POS
Antibody Screen: NEGATIVE
UNIT DIVISION: 0

## 2015-01-08 LAB — PSA: PSA: 2.69 ng/mL (ref 0.00–4.00)

## 2015-01-08 MED ORDER — SODIUM CHLORIDE 0.9 % IV SOLN
510.0000 mg | Freq: Once | INTRAVENOUS | Status: AC
  Administered 2015-01-08: 510 mg via INTRAVENOUS
  Filled 2015-01-08: qty 17

## 2015-01-08 MED ORDER — GLUCERNA SHAKE PO LIQD
237.0000 mL | Freq: Three times a day (TID) | ORAL | Status: DC
  Administered 2015-01-08 – 2015-01-18 (×21): 237 mL via ORAL
  Filled 2015-01-08 (×34): qty 237

## 2015-01-08 NOTE — Progress Notes (Signed)
The CT scans do not show any obvious primary or metastatic disease. However, it does show that there is a small pulmonary embolus. I suppose this should not be all that unusual. If he has a primary brain tumor, that he is clearly at risk for thromboembolic disease.  I would check his legs to make sure that he does not have any blood clots.  Neurosurgery seeing him. They probably will plan for surgery on January 4.  Until then, I probably would have him on heparin. With his history of GI bleeding, IV heparin would be the safest way to go for right now.  Otherwise, the really is no change in his status.  He is clearly iron deficient. I would give him some IV iron.  His hemoglobin is dropping. I would have to assume that this is from his GI tract. This might make anticoagulating him quite challenging.  What we may have to consider is an IVC filter. Again, I would check his legs for thrombi.  This situation is becoming more complicated.  Aaron E.

## 2015-01-08 NOTE — Progress Notes (Signed)
*  Preliminary Results* Bilateral lower extremity venous duplex completed. The right lower extremity is negative for deep vein thrombosis. The left lower extremity is positive for deep vein thrombosis involving a small segment of a single left posterior tibial vein in the mid portion. There is no evidence of Baker's cyst bilaterally.  Preliminary results discussed with Dr. Marin Olp.  01/08/2015  Maudry Mayhew, RVT, RDCS, RDMS

## 2015-01-08 NOTE — Progress Notes (Signed)
Patient ID: Aaron White, male   DOB: [birth year], 75 y.o.   MRN: [id number] [facility] PROGRESS NOTE  Aaron White [id number] DOB: 08/18/1940 DOA: 01/04/2015 PCP: Horatio Pel, MD  Brief narrative:    75 y.o. male with past medical history significant for hypertension, diabetes, obstructive sleep apnea on CPAP, recently hospitalized for GI bleed who now presented to [facility] long hospital because of worsening confusion over past few days prior to this admission. Per patient's family, ever since discharge 12/26/2014 patient was not really himself. Patient would say things such as him being afraid of dying and that he is losing the mammary and he wanted to see his son in [place] but was worried as he has a fear of flying.   Patient was hemodynamically stable on the admission. Blood work was significant for hemoglobin of 8.1, otherwise unremarkable. He was admitted for further evaluation of acute encephalopathy. 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 Neurosurgery, radiation oncology and oncology consulted.    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  - Neurosurgery, radiation oncology and oncology consulted. Per oncology, pt needs biopsy before any treatment plan can be instituted.  - Dr. Arnoldo Morale of neurosurgery  To do biopsy next week   PE: started on heparin drip, monitor hgb. Venous US pending to r/o DVT. Consider filter placement if +for DVT.  Anemia of chronic disease and iron deficiency anemia - Recent GI bleed and colonoscopy  12/28/2014 with finding of hemorrhoids - Hemoglobin 7.9 12/30, dropped from 9.4 on 12/29 - Aspirin placed on hold - Transfuse 1 unit PRBC on 12/30 - Continue protonix daily  -iv iron on 12/31, stool occult blood daily  Frequent urination and prostate pain? Enlarged prostate on ct ab: will check ua and psa, start flomax  Active Problems: Essential hypertension - hold HCTZ due to poor oral intake, continue avapro -on gentle hydration due to poor oral intake  Controlled diabetes mellitus type 2 without complications without long-term insulin use - Continue SSI, held metformin - Our pharmacy does not carry alogliptin-Pioglitazone  Depression/anxiety - Continue risperidone 0.5 mg at bedtime, xanax q8hrs prn and qhs prn - Psych consulted 01/07/2015 , recommended trial of zoloft    DVT Prophylaxis  - SCD's , now on heparin drip  Code Status: Full.  Family Communication:  plan of care discussed with the patient  Disposition Plan: Needs further evaluation of possible GBM, unknown when he will be discharged  IV access:  Peripheral IV  Procedures and diagnostic studies:    US Scrotum 01/04/2015  Small benign-appearing cyst in the left testicle. Otherwise unremarkable study.   [place] Art/ven Flow Abd Pelv Doppler 01/04/2015  Small benign-appearing cyst in the left testicle. Otherwise unremarkable study.   Medical Consultants:  Psychiatry Nerosurgery - Dr. Arnoldo Morale Oncology - Dr. Marin Olp Radiation oncology    Other Consultants:  PT   IAnti-Infectives:   Marijo Sanes, MD PhD [facility] Pager [phone]  Time spent in minutes: 25 minutes  If 7PM-7AM, please contact night-coverage [email or web address] Password [email or web address] 01/08/2015, 9:33 AM   LOS: 4 days    HPI/Subjective: Not oriented to time and place, states it is  2002 and he is in [place]. Very pleasant, report feeling anxious, denies pain,   Objective: Filed Vitals:   01/07/15 1313 01/07/15 2125 01/07/15 2238 01/08/15  0450  BP: 134/51 148/59  132/57  Pulse: 78 85 85 78  Temp: 98.2 F (36.8 C) 98.3 F (36.8 C)  98.2 F (36.8 C)  TempSrc: Oral Oral  Oral  Resp: 18 20 19 20   Height:      Weight:      SpO2: 100% 98%  92%    Intake/Output Summary (Last 24 hours) at 01/08/15 0933 Last data filed at 01/07/15 1700  Gross per 24 hour  Intake    480 ml  Output      0 ml  Net    480 ml    Exam:   General:  Pt is alert, not oriented follows commands appropriately, not in acute distress  Cardiovascular: Regular rate and rhythm, S1/S2, no murmurs  Respiratory: Clear to auscultation bilaterally, no wheezing, no crackles, no rhonchi  Abdomen: Soft, non tender, non distended, bowel sounds present  Extremities: No edema, pulses DP and PT palpable bilaterally  Neuro: Grossly nonfocal,not oriented  Data Reviewed: Basic Metabolic Panel:  Recent Labs Lab 01/04/15 1418 01/05/15 0400 01/06/15 0535 01/07/15 0150 01/08/15 0506  NA 139 142 139 138 140  K 3.7 3.3* 4.0 4.1 3.6  CL 102 104 107 103 106  CO2 30 29 26 28 24   GLUCOSE 249* 203* 163* 141* 179*  BUN 13 10 11 12 12   CREATININE 0.89 0.82 0.96 1.07 0.80  CALCIUM 8.8* 9.4 8.6* 8.6* 8.7*  MG  --  1.8  --   --  1.8  PHOS  --  2.9  --   --   --    Liver Function Tests:  Recent Labs Lab 01/04/15 1418 01/05/15 0400 01/06/15 0535 01/07/15 0150  AST 17 23 17 18   ALT 23 25 20 20   ALKPHOS 49 63 48 46  BILITOT 0.6 1.2 0.9 1.1  PROT 6.2* 7.6 5.7* 6.1*  ALBUMIN 3.6 4.3 3.1* 3.4*   No results for input(s): LIPASE, AMYLASE in the last 168 hours. No results for input(s): AMMONIA in the last 168 hours. CBC:  Recent Labs Lab 01/04/15 1418 01/05/15 0400 01/06/15 0535 01/07/15 0150 01/08/15 0506  WBC 5.8 5.9 5.9 6.6 6.7  NEUTROABS  --  3.7 3.6 4.8  --   HGB 8.1* 9.4* 7.4* 8.8* 8.4*  HCT 24.4* 29.8* 23.0* 26.6* 25.8*  MCV 90.0 88.7 88.8 87.8 86.9  PLT 275 339 267 276 252   Cardiac Enzymes: No results for input(s): CKTOTAL, CKMB,  CKMBINDEX, TROPONINI in the last 168 hours. BNP: Invalid input(s): POCBNP CBG:  Recent Labs Lab 01/07/15 0759 01/07/15 1202 01/07/15 1652 01/07/15 2318 01/08/15 0752  GLUCAP 162* 150* 118* 196* 155*    Recent Results (from the past 240 hour(s))  Urine culture     Status: None   Collection Time: 12/31/14  4:31 PM  Result Value Ref Range Status   Specimen Description URINE, CLEAN CATCH  Final   Special Requests NONE  Final   Culture MULTIPLE SPECIES PRESENT, SUGGEST RECOLLECTION  Final   Report Status 01/01/2015 FINAL  Final     Scheduled Meds: . Alogliptin-Pioglitazone  1 tablet Oral Daily  . aspirin EC  81 mg Oral Daily  . cholecalciferol  2,000 Units Oral Daily  . fluticasone  1 spray Each Nare Daily  . irbesartan  300 mg Oral Daily   And  .  hydrochlorothiazide  25 mg Oral Daily  . metFORMIN  1,000 mg Oral BID WC  . multivitamin with minerals  1 tablet Oral Daily  . pantoprazole  40 mg Oral Daily  . potassium chloride SA  40 mEq Oral Once  . risperiDONE  0.5 mg Oral QHS  . vitamin E  200 Units Oral Daily   Continuous Infusions: . 0.9 % NaCl with KCl 20 mEq / L 50 mL/hr at 01/07/15 0704  . heparin 1,450 Units/hr (01/07/15 2239)

## 2015-01-08 NOTE — Progress Notes (Signed)
Pt awoke confused after removing both IV sites.  Patient IV sites were bandaged and room cleared and cleaned cleaned.  Patient placed back in bed with two new IV sites and active transfusions restored.

## 2015-01-08 NOTE — Progress Notes (Signed)
ANTICOAGULATION CONSULT NOTE - follow-up Consult  Pharmacy Consult for Heparin Indication: pulmonary embolus  Allergies  Allergen Reactions  . Strawberry Flavor Hives  . Penicillins Rash    Has patient had a PCN reaction causing immediate rash, facial/tongue/throat swelling, SOB or lightheadedness with hypotension: No Has patient had a PCN reaction causing severe rash involving mucus membranes or skin necrosis: No Has patient had a PCN reaction that required hospitalization No Has patient had a PCN reaction occurring within the last 10 years: No If all of the above answers are "NO", then may proceed with Cephalosporin use.     Patient Measurements: Height: 5\' 10"  (177.8 cm) Weight: 210 lb (95.255 kg) IBW/kg (Calculated) : 73 Heparin Dosing Weight: 92 kg   Vital Signs: Temp: 98.2 F (36.8 C) (01/01 0450) Temp Source: Oral (01/01 0450) BP: 132/57 mmHg (01/01 0450) Pulse Rate: 78 (01/01 0450)  Labs:  Recent Labs  01/06/15 0535 01/07/15 0150 01/07/15 1033 01/07/15 2030 01/08/15 0506  HGB 7.4* 8.8*  --   --  8.4*  HCT 23.0* 26.6*  --   --  25.8*  PLT 267 276  --   --  252  APTT  --  25  --   --   --   LABPROT  --  16.0*  --   --   --   INR  --  1.27  --   --   --   HEPARINUNFRC  --   --  0.25* 0.52 0.44  CREATININE 0.96 1.07  --   --  0.80    Estimated Creatinine Clearance: 92.4 mL/min (by C-G formula based on Cr of 0.8).   Medical History: History reviewed. No pertinent past medical history.  Medications:  Scheduled:  . Alogliptin-Pioglitazone  1 tablet Oral Daily  . cholecalciferol  2,000 Units Oral Daily  . fluticasone  1 spray Each Nare Daily  . insulin aspart  0-9 Units Subcutaneous TID WC  . irbesartan  300 mg Oral Daily  . multivitamin with minerals  1 tablet Oral Daily  . pantoprazole  40 mg Oral Daily  . risperiDONE  0.5 mg Oral QHS  . sertraline  25 mg Oral Daily  . sodium chloride  3 mL Intravenous Q12H  . tamsulosin  0.4 mg Oral Daily  .  vitamin E  200 Units Oral Daily   Infusions:  . 0.9 % NaCl with KCl 20 mEq / L 50 mL/hr at 01/07/15 0704  . heparin 1,450 Units/hr (01/07/15 2239)    Assessment: 75 yr male with recent hospitalization for GI bleed (discharged 12/21).  Admitted on 12/28 for AMS; MRI showed a brain tumor.  Plans for brain biopsy on 01/11/15.  CTAngio shows small pulmonary embolus.  Anticoagulation d/w neurosurgery per [facility] note and given OK to start heaprin gtt and hold anticoagulation prior to surgery.  Pharmacy consulted to dose IV heparin for treatment of Pulmonary embolism.  Today's labs, 01/08/2015  Heparin level 0.44 - therapeutic on Heparin at 1450 units/hr  CBC: Hgb slightly decreased (s/p PRBC), pltc WNL.  No bleeding or complications reported by RN  Anemia panel reveals iron deficiency anemia  RN notes pt removed IV on 1/1 at about 1am.  Heparin infusion was off for ~ 10 minutes, but IV sites were quickly replaced.  No further complications reported.  Goal of Therapy:  Heparin level 0.3-0.7 units/ml, preferably, level  0.3-0.5 d/t brain mass, recent GIB Monitor platelets by anticoagulation protocol: Yes   Plan:  Continue  heparin IV infusion at 1450 units/hr Heparin level in 8 hours to confirm therapeutic rate. Daily heparin level and CBC  Continue to monitor H&H and platelets  Gretta Arab PharmD, BCPS Pager [phone] 01/08/2015 7:07 AM

## 2015-01-08 NOTE — Progress Notes (Signed)
ANTICOAGULATION CONSULT NOTE - follow-up Consult  Pharmacy Consult for Heparin Indication: pulmonary embolus  Allergies  Allergen Reactions  . Strawberry Flavor Hives  . Penicillins Rash    Has patient had a PCN reaction causing immediate rash, facial/tongue/throat swelling, SOB or lightheadedness with hypotension: No Has patient had a PCN reaction causing severe rash involving mucus membranes or skin necrosis: No Has patient had a PCN reaction that required hospitalization No Has patient had a PCN reaction occurring within the last 10 years: No If all of the above answers are "NO", then may proceed with Cephalosporin use.    Patient Measurements: Height: 5\' 10"  (177.8 cm) Weight: 210 lb (95.255 kg) IBW/kg (Calculated) : 73 Heparin Dosing Weight: 92 kg   Vital Signs: Temp: 98.8 F (37.1 C) (01/01 1339) Temp Source: Oral (01/01 1339) BP: 117/51 mmHg (01/01 1339) Pulse Rate: 79 (01/01 1339)  Labs:  Recent Labs  01/06/15 0535 01/07/15 0150  01/07/15 2030 01/08/15 0506 01/08/15 1608  HGB 7.4* 8.8*  --   --  8.4*  --   HCT 23.0* 26.6*  --   --  25.8*  --   PLT 267 276  --   --  252  --   APTT  --  25  --   --   --   --   LABPROT  --  16.0*  --   --   --   --   INR  --  1.27  --   --   --   --   HEPARINUNFRC  --   --   < > 0.52 0.44 0.48  CREATININE 0.96 1.07  --   --  0.80  --   < > = values in this interval not displayed.  Estimated Creatinine Clearance: 92.4 mL/min (by C-G formula based on Cr of 0.8).  Medical History: History reviewed. No pertinent past medical history.  Medications:  Scheduled:  . cholecalciferol  2,000 Units Oral Daily  . feeding supplement (GLUCERNA SHAKE)  237 mL Oral TID BM  . fluticasone  1 spray Each Nare Daily  . insulin aspart  0-9 Units Subcutaneous TID WC  . irbesartan  300 mg Oral Daily  . multivitamin with minerals  1 tablet Oral Daily  . pantoprazole  40 mg Oral Daily  . risperiDONE  0.5 mg Oral QHS  . sertraline  25 mg Oral  Daily  . sodium chloride  3 mL Intravenous Q12H  . tamsulosin  0.4 mg Oral Daily  . vitamin E  200 Units Oral Daily   Infusions:  . 0.9 % NaCl with KCl 20 mEq / L 50 mL/hr at 01/08/15 1205  . heparin 1,450 Units/hr (01/08/15 1305)   Assessment: 75 yr male with recent hospitalization for GI bleed (discharged 12/21).  Admitted on 12/28 for AMS; MRI showed a brain tumor.  Plans for brain biopsy on 01/11/15.  CTAngio shows small pulmonary embolus.  Anticoagulation d/w neurosurgery per [facility]level note and given OK to start heaprin gtt and hold anticoagulation prior to surgery.  Pharmacy consulted to dose IV heparin for treatment of Pulmonary embolism.  Today's labs, 01/08/2015  Heparin level this am 0.44 - therapeutic on Heparin at 1450 units/hr  CBC: Hgb slightly decreased (s/p PRBC), pltc WNL.  No bleeding or complications reported by RN  Anemia panel reveals iron deficiency anemia  RN notes pt removed IV on 1/1 at about 1am.  Heparin infusion was off for ~ 10 minutes, but IV sites were  quickly replaced.  No further complications reported.  Repeat level this afternoon = 0.48, still in desired range  Goal of Therapy:  Heparin level 0.3-0.7 units/ml, preferably, level  0.3-0.5 d/t brain mass, recent GIB Monitor platelets by anticoagulation protocol: Yes   Plan:  Continue heparin IV infusion at 1450 units/hr Daily heparin level and CBC  Continue to monitor H&H and platelets  Minda Ditto PharmD Pager [phone] 01/08/2015, 5:25 PM

## 2015-01-09 LAB — MAGNESIUM: MAGNESIUM: 1.9 mg/dL (ref 1.7–2.4)

## 2015-01-09 LAB — BASIC METABOLIC PANEL
ANION GAP: 7 (ref 5–15)
BUN: 13 mg/dL (ref 6–20)
CALCIUM: 8.8 mg/dL — AB (ref 8.9–10.3)
CO2: 26 mmol/L (ref 22–32)
Chloride: 110 mmol/L (ref 101–111)
Creatinine, Ser: 0.92 mg/dL (ref 0.61–1.24)
GFR calc Af Amer: >60 mL/min (ref >60)
GLUCOSE: 135 mg/dL — AB (ref 65–99)
POTASSIUM: 3.7 mmol/L (ref 3.5–5.1)
SODIUM: 143 mmol/L (ref 135–145)

## 2015-01-09 LAB — HEPARIN LEVEL (UNFRACTIONATED)
HEPARIN UNFRACTIONATED: 0.6 [IU]/mL (ref 0.30–0.70)
Heparin Unfractionated: 0.53 IU/mL (ref 0.30–0.70)

## 2015-01-09 LAB — GLUCOSE, CAPILLARY
GLUCOSE-CAPILLARY: 126 mg/dL — AB (ref 65–99)
GLUCOSE-CAPILLARY: 158 mg/dL — AB (ref 65–99)
GLUCOSE-CAPILLARY: 189 mg/dL — AB (ref 65–99)
Glucose-Capillary: 156 mg/dL — ABNORMAL HIGH (ref 65–99)
Glucose-Capillary: 216 mg/dL — ABNORMAL HIGH (ref 65–99)

## 2015-01-09 LAB — CBC
HCT: 24.1 % — ABNORMAL LOW (ref 39.0–52.0)
HEMOGLOBIN: 7.8 g/dL — AB (ref 13.0–17.0)
MCH: 28.6 pg (ref 26.0–34.0)
MCHC: 32.4 g/dL (ref 30.0–36.0)
MCV: 88.3 fL (ref 78.0–100.0)
PLATELETS: 243 10*3/uL (ref 150–400)
RBC: 2.73 MIL/uL — AB (ref 4.22–5.81)
RDW: 13.9 % (ref 11.5–15.5)
WBC: 8 10*3/uL (ref 4.0–10.5)

## 2015-01-09 MED ORDER — HEPARIN (PORCINE) IN NACL 100-0.45 UNIT/ML-% IJ SOLN
1300.0000 [IU]/h | INTRAMUSCULAR | Status: DC
  Administered 2015-01-09 – 2015-01-10 (×2): 1300 [IU]/h via INTRAVENOUS
  Filled 2015-01-09 (×3): qty 250

## 2015-01-09 MED ORDER — HEPARIN (PORCINE) IN NACL 100-0.45 UNIT/ML-% IJ SOLN
1300.0000 [IU]/h | INTRAMUSCULAR | Status: DC
  Administered 2015-01-09: 1350 [IU]/h via INTRAVENOUS
  Filled 2015-01-09: qty 250

## 2015-01-09 NOTE — Progress Notes (Signed)
ANTICOAGULATION CONSULT NOTE - follow-up Consult  Pharmacy Consult for Heparin Indication: pulmonary embolus  Allergies  Allergen Reactions  . Strawberry Flavor Hives  . Penicillins Rash    Has patient had a PCN reaction causing immediate rash, facial/tongue/throat swelling, SOB or lightheadedness with hypotension: No Has patient had a PCN reaction causing severe rash involving mucus membranes or skin necrosis: No Has patient had a PCN reaction that required hospitalization No Has patient had a PCN reaction occurring within the last 10 years: No If all of the above answers are "NO", then may proceed with Cephalosporin use.    Patient Measurements: Height: 5\' 10"  (177.8 cm) Weight: 210 lb (95.255 kg) IBW/kg (Calculated) : 73 Heparin Dosing Weight: 92 kg   Vital Signs: Temp: 98.5 F (36.9 C) (01/02 0605) Temp Source: Axillary (01/02 0605) BP: 133/60 mmHg (01/02 0605) Pulse Rate: 57 (01/02 0605)  Labs:  Recent Labs  01/07/15 0150  01/08/15 0506 01/08/15 1608 01/09/15 0508  HGB 8.8*  --  8.4*  --  7.8*  HCT 26.6*  --  25.8*  --  24.1*  PLT 276  --  252  --  243  APTT 25  --   --   --   --   LABPROT 16.0*  --   --   --   --   INR 1.27  --   --   --   --   HEPARINUNFRC  --   < > 0.44 0.48 0.60  CREATININE 1.07  --  0.80  --  0.92  < > = values in this interval not displayed.  Estimated Creatinine Clearance: 80.4 mL/min (by C-G formula based on Cr of 0.92).  Medical History: History reviewed. No pertinent past medical history.  Medications:  Scheduled:  . cholecalciferol  2,000 Units Oral Daily  . feeding supplement (GLUCERNA SHAKE)  237 mL Oral TID BM  . fluticasone  1 spray Each Nare Daily  . insulin aspart  0-9 Units Subcutaneous TID WC  . irbesartan  300 mg Oral Daily  . multivitamin with minerals  1 tablet Oral Daily  . pantoprazole  40 mg Oral Daily  . risperiDONE  0.5 mg Oral QHS  . sertraline  25 mg Oral Daily  . sodium chloride  3 mL Intravenous Q12H   . tamsulosin  0.4 mg Oral Daily  . vitamin E  200 Units Oral Daily   Infusions:  . 0.9 % NaCl with KCl 20 mEq / L 50 mL/hr at 01/08/15 1205  . heparin     Assessment: 75 yr male with recent hospitalization for GI bleed (discharged 12/21).  Admitted on 12/28 for AMS; MRI showed a brain tumor.  Plans for brain biopsy on 01/11/15.  CTAngio shows small pulmonary embolus.  Anticoagulation d/w neurosurgery per [facility] note and given OK to start heaprin gtt and hold anticoagulation prior to surgery.  Pharmacy consulted to dose IV heparin for treatment of Pulmonary embolism.  Today's labs, 01/09/2015  Heparin level this am 0.6 - above desired goal of 0.3-0.5 on Heparin at 1450 units/hr  CBC: Hgb slightly decreased; pltc WNL.  No bleeding complications   Anemia panel reveals iron deficiency anemia  Goal of Therapy:  Heparin level  0.3-0.5 d/t brain mass, recent GIB Monitor platelets by anticoagulation protocol: Yes   Plan:  Decrease heparin IV infusion to 1350 units/hr Check heparin level 8 hr after rate decrease Daily heparin level and CBC  Continue to monitor H&H and platelets  Felipe Cabell  Loris Seelye, PharmD  01/09/2015, 6:11 AM

## 2015-01-09 NOTE — Progress Notes (Addendum)
Physical Therapy Treatment Patient Details Name: CLAUDEL BELKA MRN: [id number] DOB: 1940-06-06 Today's Date: 01/09/2015    History of Present Illness 75 yo male admitted with AMS. Hx of HTN,DM, sleep apnea, bil knee OA    PT Comments    Pt appears weaker today than on last session. Tolerated distance well. Required support of IV pole for entire distance. Confusion noted today as well.   Follow Up Recommendations  Home health PT;Supervision/Assistance - 24 hour     Equipment Recommendations  None recommended by PT    Recommendations for Other Services       Precautions / Restrictions Precautions Precautions: Fall Restrictions Weight Bearing Restrictions: No    Mobility  Bed Mobility Overal bed mobility: Modified Independent             General bed mobility comments: increased time  Transfers Overall transfer level: Needs assistance     Sit to Stand: Min assist         General transfer comment: small amount to assist to stabilize. Increased time to rise to standing  Ambulation/Gait Ambulation/Gait assistance: Min assist Ambulation Distance (Feet): 300 Feet Assistive device:  (IV pole) Gait Pattern/deviations: Trunk flexed;Decreased stride length;Decreased step length - right;Decreased step length - left;Step-through pattern     General Gait Details: VCs posture. slow gait speed. noted shorter step length bilaterally on today. small amoun of assist to stabilize intemittently.    Stairs            Wheelchair Mobility    Modified Rankin (Stroke Patients Only)       Balance           Standing balance support: During functional activity Standing balance-Leahy Scale: Fair                      Cognition Arousal/Alertness: Awake/alert Behavior During Therapy: WFL for tasks assessed/performed Overall Cognitive Status: Impaired/Different from baseline Area of Impairment: Orientation;Memory;Safety/judgement Orientation Level:  Place       Safety/Judgement: Decreased awareness of safety          Exercises      General Comments        Pertinent Vitals/Pain Pain Assessment: 0-10 Pain Score: 4  Pain Location: groin area Pain Descriptors / Indicators: Sore Pain Intervention(s): Monitored during session;Repositioned    Home Living                      Prior Function            PT Goals (current goals can now be found in the care plan section) Progress towards PT goals: Progressing toward goals    Frequency  Min 3X/week    PT Plan Current plan remains appropriate    Co-evaluation             End of Session Equipment Utilized During Treatment: Gait belt Activity Tolerance: Patient tolerated treatment well Patient left: in bed;with call bell/phone within reach;with bed alarm set     Time: [id number] PT Time Calculation (min) (ACUTE ONLY): 19 min  Charges:  $Gait Training: 8-22 mins                    G Codes:      Weston Anna, MPT Pager: [phone]

## 2015-01-09 NOTE — Progress Notes (Signed)
PHARMACIST - PHYSICIAN COMMUNICATION CONCERNING:  IV heparin  78 yoM on IV heparin for PE with lower goal rate 0.3-0.5 due to recent hospitalization for GIB.  Heparin level tonight is slightly supratherapeutic @ 0.53.  No bleeding or infusion issues per RN.   RECOMMENDATION: Reduce to 1300 units/hr = 13 ml/hr.  F/u heparin level in 8 hours after rate decrease.     Ralene Bathe, PharmD, BCPS 01/09/2015, 4:30 PM  Pager: [phone]

## 2015-01-09 NOTE — Progress Notes (Signed)
   01/09/15 1300  Clinical Encounter Type  Visited With Patient and family together  Visit Type Initial;Spiritual support;Other (Comment) (Advance Directive)  Referral From Nurse  Consult/Referral To Chaplain  Spiritual Encounters  Spiritual Needs Other (Comment) (Advance Directive Question)  Stress Factors  Patient Stress Factors Financial concerns;Other (Comment)  Family Stress Factors Financial concerns;Other (Comment) (Advance Directive)  Advance Directives (For Healthcare)  Does patient have an advance directive? No  Would patient like information on creating an advanced directive? Yes - Scientist, clinical (histocompatibility and immunogenetics) given   I visited with the patient per [place] consult placed by the nurse. I consulted with the Unit Director and Nurse before entering the room. I was made aware that the patient had some altered mental status, possibly due to a brain mass.  When I entered the room, the patient's wife and another lady were at the bedside.  The patient's wife stated that she was interested in an Advance Directive for the patient, and had spoken with another Chaplain previously. Patient's wife requested paperwork.  I explained to the patient and his wife that it was necessary for the patient to be completely coherent in order to complete the document, and that because he is currently confused that we would not be able to complete an Advance Directive.  The wife's anxiety seemed to be calmed when I explained that she is the patient's next of kin and automatically his power of attorney for healthcare.  She was also concerned about financial power of attorney. I explained that we cannot do those or give legal advice here in the hospital.  No follow-up is necessary at this time. Please page the Chaplain if a need arises.     [place] M.Div.

## 2015-01-09 NOTE — Plan of Care (Signed)
Problem: Health Behavior/Discharge Planning: Goal: Ability to manage health-related needs will improve Outcome: Progressing Pastoral care consulted for assist with creation of advance directive

## 2015-01-09 NOTE — Progress Notes (Signed)
Patient ID: Aaron White, male   DOB: March 29, 1940, 75 y.o.   MRN: [id number] [facility] PROGRESS NOTE  Aaron White [id number] DOB: [birth year] DOA: 01/04/2015 PCP: Horatio Pel, MD  Brief narrative:    75 y.o. male with past medical history significant for hypertension, diabetes, obstructive sleep apnea on CPAP, recently hospitalized for GI bleed who now presented to [facility] long hospital because of worsening confusion over past few days prior to this admission. Per patient's family, ever since discharge 12/26/2014 patient was not really himself. Patient would say things such as him being afraid of dying and that he is losing the mammary and he wanted to see his son in [place] but was worried as he has a fear of flying.   Patient was hemodynamically stable on the admission. Blood work was significant for hemoglobin of 8.1, otherwise unremarkable. He was admitted for further evaluation of acute encephalopathy. 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 Neurosurgery, radiation oncology and oncology consulted.    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  - Neurosurgery, radiation oncology and oncology consulted. Per oncology, pt needs biopsy before any treatment plan can be instituted.  - Dr. Arnoldo Morale of neurosurgery  To do biopsy on 1/4, keep npo after midnight on 1/3  PE: started on heparin drip, monitor hgb. Venous US + DVT. Consider filter placement if +for DVT in left posterial tibial vein, to be determined by oncology.  Anemia of  chronic disease and iron deficiency anemia - Recent GI bleed and colonoscopy 12/28/2014 with finding of hemorrhoids - Hemoglobin 7.9 12/30, dropped from 9.4 on 12/29 - Aspirin placed on hold - Transfuse 1 unit PRBC on 12/30 - Continue protonix daily  -iv iron on 12/31, stool occult blood daily -transfuse prbc to keep hgb>7. Likely will need prbc transfusion prior to biopsy.  Frequent urination and prostate pain? Enlarged prostate on ct ab: ua unremarkable, psa normalized , started flomax  Active Problems: Essential hypertension - hold HCTZ due to poor oral intake, continue avapro -on gentle hydration due to poor oral intake  Controlled diabetes mellitus type 2 without complications without long-term insulin use - Continue SSI, held metformin - Our pharmacy does not carry alogliptin-Pioglitazone  Depression/anxiety - Continue risperidone 0.5 mg at bedtime, xanax q8hrs prn and qhs prn - Psych consulted 01/07/2015 , recommended trial of zoloft    DVT Prophylaxis  - SCD's , now on heparin drip  Code Status: Full.  Family Communication:  plan of care discussed with the patient  Disposition Plan: Needs further evaluation of possible GBM, unknown when he will be discharged  IV access:  Peripheral IV  Procedures and diagnostic studies:    US Scrotum 01/04/2015  Small benign-appearing cyst in the left testicle. Otherwise unremarkable study.   [place] Art/ven Flow Abd Pelv Doppler 01/04/2015  Small benign-appearing cyst in the left testicle. Otherwise unremarkable study.   Medical Consultants:  Psychiatry Nerosurgery - Dr. Arnoldo Morale Oncology - Dr. Marin Olp Radiation oncology    Other Consultants:  PT   IAnti-Infectives:   Marijo Sanes, MD PhD [facility] Pager [phone]  Time spent in minutes: 25 minutes  If 7PM-7AM, please  contact night-coverage [email or web address] Password [email or web address] 01/09/2015, 9:06 AM   LOS: 5 days    HPI/Subjective: C/o feeling confused, speech very  repetitive, states it is august 2017, know he is in the hospital, thick he is in [place],  Very pleasant, denies pain,   Objective: Filed Vitals:   01/08/15 0450 01/08/15 1339 01/08/15 2053 01/09/15 0605  BP: 132/57 117/51 116/58 133/60  Pulse: 78 79 70 57  Temp: 98.2 F (36.8 C) 98.8 F (37.1 C) 98.3 F (36.8 C) 98.5 F (36.9 C)  TempSrc: Oral Oral Oral Axillary  Resp: 20 18 16 16   Height:      Weight:      SpO2: 92% 89% 95% 98%    Intake/Output Summary (Last 24 hours) at 01/09/15 0906 Last data filed at 01/08/15 1305  Gross per 24 hour  Intake    240 ml  Output    150 ml  Net     90 ml    Exam:   General:  Pt is alert, not oriented follows commands appropriately, not in acute distress  Cardiovascular: Regular rate and rhythm, S1/S2, no murmurs  Respiratory: Clear to auscultation bilaterally, no wheezing, no crackles, no rhonchi  Abdomen: Soft, non tender, non distended, bowel sounds present  Extremities: No edema, pulses DP and PT palpable bilaterally  Neuro: Grossly nonfocal,not oriented  Data Reviewed: Basic Metabolic Panel:  Recent Labs Lab 01/05/15 0400 01/06/15 0535 01/07/15 0150 01/08/15 0506 01/09/15 0508  NA 142 139 138 140 143  K 3.3* 4.0 4.1 3.6 3.7  CL 104 107 103 106 110  CO2 29 26 28 24 26   GLUCOSE 203* 163* 141* 179* 135*  BUN 10 11 12 12 13   CREATININE 0.82 0.96 1.07 0.80 0.92  CALCIUM 9.4 8.6* 8.6* 8.7* 8.8*  MG 1.8  --   --  1.8 1.9  PHOS 2.9  --   --   --   --    Liver Function Tests:  Recent Labs Lab 01/04/15 1418 01/05/15 0400 01/06/15 0535 01/07/15 0150  AST 17 23 17 18   ALT 23 25 20 20   ALKPHOS 49 63 48 46  BILITOT 0.6 1.2 0.9 1.1  PROT 6.2* 7.6 5.7* 6.1*  ALBUMIN 3.6 4.3 3.1* 3.4*   No results for input(s): LIPASE, AMYLASE in the last 168 hours. No results for input(s): AMMONIA in the last 168 hours. CBC:  Recent Labs Lab 01/05/15 0400 01/06/15 0535 01/07/15 0150 01/08/15 0506 01/09/15 0508  WBC 5.9 5.9  6.6 6.7 8.0  NEUTROABS 3.7 3.6 4.8  --   --   HGB 9.4* 7.4* 8.8* 8.4* 7.8*  HCT 29.8* 23.0* 26.6* 25.8* 24.1*  MCV 88.7 88.8 87.8 86.9 88.3  PLT 339 267 276 252 243   Cardiac Enzymes: No results for input(s): CKTOTAL, CKMB, CKMBINDEX, TROPONINI in the last 168 hours. BNP: Invalid input(s): POCBNP CBG:  Recent Labs Lab 01/08/15 0752 01/08/15 1157 01/08/15 1810 01/08/15 2123 01/09/15 0800  GLUCAP 155* 233* 207* 140* 126*    Recent Results (from the past 240 hour(s))  Urine culture     Status: None   Collection Time: 12/31/14  4:31 PM  Result Value Ref Range Status   Specimen Description URINE, CLEAN CATCH  Final   Special Requests NONE  Final   Culture MULTIPLE SPECIES PRESENT, SUGGEST RECOLLECTION  Final   Report Status 01/01/2015 FINAL  Final     Scheduled Meds: . Alogliptin-Pioglitazone  1 tablet Oral Daily  . aspirin EC  81 mg  Oral Daily  . cholecalciferol  2,000 Units Oral Daily  . fluticasone  1 spray Each Nare Daily  . irbesartan  300 mg Oral Daily   And  . hydrochlorothiazide  25 mg Oral Daily  . metFORMIN  1,000 mg Oral BID WC  . multivitamin with minerals  1 tablet Oral Daily  . pantoprazole  40 mg Oral Daily  . potassium chloride SA  40 mEq Oral Once  . risperiDONE  0.5 mg Oral QHS  . vitamin E  200 Units Oral Daily   Continuous Infusions: . 0.9 % NaCl with KCl 20 mEq / L 50 mL/hr at 01/09/15 0820  . heparin 1,350 Units/hr (01/09/15 0821)

## 2015-01-09 NOTE — Progress Notes (Signed)
CSW reviewed Psych MD notes and there are no psych social work or placement needs at this time.    CSW signing off.    [name] Pelt [facility]  [phone]

## 2015-01-09 NOTE — Plan of Care (Signed)
Problem: Tissue Perfusion: Goal: Risk factors for ineffective tissue perfusion will decrease Outcome: Progressing +DVT heparin drip infusing

## 2015-01-10 ENCOUNTER — Inpatient Hospital Stay (HOSPITAL_COMMUNITY): Payer: 59

## 2015-01-10 DIAGNOSIS — R310 Gross hematuria: Secondary | ICD-10-CM | POA: Insufficient documentation

## 2015-01-10 DIAGNOSIS — I82402 Acute embolism and thrombosis of unspecified deep veins of left lower extremity: Secondary | ICD-10-CM

## 2015-01-10 DIAGNOSIS — G9389 Other specified disorders of brain: Secondary | ICD-10-CM | POA: Insufficient documentation

## 2015-01-10 LAB — CBC
HCT: 26.3 % — ABNORMAL LOW (ref 39.0–52.0)
HEMATOCRIT: 26.2 % — AB (ref 39.0–52.0)
HEMOGLOBIN: 8.4 g/dL — AB (ref 13.0–17.0)
Hemoglobin: 8.4 g/dL — ABNORMAL LOW (ref 13.0–17.0)
MCH: 29.2 pg (ref 26.0–34.0)
MCH: 28.7 pg (ref 26.0–34.0)
MCHC: 31.9 g/dL (ref 30.0–36.0)
MCHC: 32.1 g/dL (ref 30.0–36.0)
MCV: 91.3 fL (ref 78.0–100.0)
MCV: 89.4 fL (ref 78.0–100.0)
PLATELETS: 285 10*3/uL (ref 150–400)
Platelets: 277 10*3/uL (ref 150–400)
RBC: 2.93 MIL/uL — ABNORMAL LOW (ref 4.22–5.81)
RBC: 2.88 MIL/uL — AB (ref 4.22–5.81)
RDW: 14.3 % (ref 11.5–15.5)
RDW: 14.1 % (ref 11.5–15.5)
WBC: 9.8 10*3/uL (ref 4.0–10.5)
WBC: 10 10*3/uL (ref 4.0–10.5)

## 2015-01-10 LAB — BASIC METABOLIC PANEL
ANION GAP: 8 (ref 5–15)
BUN: 13 mg/dL (ref 6–20)
CHLORIDE: 109 mmol/L (ref 101–111)
CO2: 25 mmol/L (ref 22–32)
Calcium: 8.8 mg/dL — ABNORMAL LOW (ref 8.9–10.3)
Creatinine, Ser: 0.88 mg/dL (ref 0.61–1.24)
Glucose, Bld: 217 mg/dL — ABNORMAL HIGH (ref 65–99)
POTASSIUM: 3.7 mmol/L (ref 3.5–5.1)
SODIUM: 142 mmol/L (ref 135–145)

## 2015-01-10 LAB — GLUCOSE, CAPILLARY
GLUCOSE-CAPILLARY: 146 mg/dL — AB (ref 65–99)
Glucose-Capillary: 162 mg/dL — ABNORMAL HIGH (ref 65–99)
Glucose-Capillary: 180 mg/dL — ABNORMAL HIGH (ref 65–99)
Glucose-Capillary: 209 mg/dL — ABNORMAL HIGH (ref 65–99)

## 2015-01-10 LAB — PREPARE RBC (CROSSMATCH)

## 2015-01-10 LAB — URINALYSIS, ROUTINE W REFLEX MICROSCOPIC
BILIRUBIN URINE: NEGATIVE
GLUCOSE, UA: NEGATIVE mg/dL
KETONES UR: NEGATIVE mg/dL
LEUKOCYTES UA: NEGATIVE
NITRITE: NEGATIVE
PROTEIN: NEGATIVE mg/dL
Specific Gravity, Urine: 1.022 (ref 1.005–1.030)
pH: 7.5 (ref 5.0–8.0)

## 2015-01-10 LAB — URINE MICROSCOPIC-ADD ON

## 2015-01-10 LAB — HEPARIN LEVEL (UNFRACTIONATED): Heparin Unfractionated: 0.44 IU/mL (ref 0.30–0.70)

## 2015-01-10 MED ORDER — ACETAMINOPHEN 325 MG PO TABS
650.0000 mg | ORAL_TABLET | Freq: Once | ORAL | Status: AC
  Administered 2015-01-10: 650 mg via ORAL
  Filled 2015-01-10: qty 2

## 2015-01-10 MED ORDER — FENTANYL CITRATE (PF) 100 MCG/2ML IJ SOLN
INTRAMUSCULAR | Status: AC
  Filled 2015-01-10: qty 4

## 2015-01-10 MED ORDER — FENTANYL CITRATE (PF) 100 MCG/2ML IJ SOLN
INTRAMUSCULAR | Status: AC | PRN
  Administered 2015-01-10: 50 ug via INTRAVENOUS

## 2015-01-10 MED ORDER — FINASTERIDE 5 MG PO TABS
5.0000 mg | ORAL_TABLET | Freq: Every day | ORAL | Status: DC
  Administered 2015-01-10 – 2015-01-18 (×8): 5 mg via ORAL
  Filled 2015-01-10 (×8): qty 1

## 2015-01-10 MED ORDER — IOHEXOL 300 MG/ML  SOLN
100.0000 mL | Freq: Once | INTRAMUSCULAR | Status: AC | PRN
  Administered 2015-01-10: 60 mL via INTRAVENOUS

## 2015-01-10 MED ORDER — GADOBENATE DIMEGLUMINE 529 MG/ML IV SOLN
20.0000 mL | Freq: Once | INTRAVENOUS | Status: AC | PRN
  Administered 2015-01-10: 20 mL via INTRAVENOUS

## 2015-01-10 MED ORDER — LIDOCAINE-EPINEPHRINE 2 %-1:100000 IJ SOLN
INTRAMUSCULAR | Status: AC
  Filled 2015-01-10: qty 1

## 2015-01-10 MED ORDER — SODIUM CHLORIDE 0.9 % IV SOLN
510.0000 mg | Freq: Once | INTRAVENOUS | Status: DC
  Filled 2015-01-10: qty 17

## 2015-01-10 MED ORDER — PANTOPRAZOLE SODIUM 40 MG PO TBEC
40.0000 mg | DELAYED_RELEASE_TABLET | Freq: Every day | ORAL | Status: DC
  Administered 2015-01-10: 40 mg via ORAL
  Filled 2015-01-10: qty 1

## 2015-01-10 MED ORDER — SODIUM CHLORIDE 0.9 % IV SOLN
Freq: Once | INTRAVENOUS | Status: AC
  Administered 2015-01-11: 09:00:00 via INTRAVENOUS

## 2015-01-10 MED ORDER — IOHEXOL 300 MG/ML  SOLN
50.0000 mL | Freq: Once | INTRAMUSCULAR | Status: AC | PRN
  Administered 2015-01-10: 15 mL via INTRAVENOUS

## 2015-01-10 MED ORDER — SODIUM CHLORIDE 0.9 % IV SOLN
Freq: Once | INTRAVENOUS | Status: AC
  Administered 2015-01-11 (×2): via INTRAVENOUS

## 2015-01-10 MED ORDER — PANTOPRAZOLE SODIUM 40 MG IV SOLR: 40.0000 mg | Freq: Two times a day (BID) | INTRAVENOUS | Status: DC

## 2015-01-10 NOTE — Progress Notes (Signed)
   01/10/15 1100  Clinical Encounter Type  Visited With Patient and family together  Visit Type Follow-up;Psychological support;Other (Comment) (Advance Directive)  Referral From Nurse  Consult/Referral To Chaplain  Spiritual Encounters  Spiritual Needs Other (Comment) (Advance Directives)  Stress Factors  Patient Stress Factors Financial concerns  Family Stress Factors Financial concerns   I answered a page from the nurse of Aaron White. The nurse explained that the patient's wife was asking about HCPOA paperwork again.  Upon speaking with the patient and his wife, they were asking about Financial Power of [place] paperwork. I explained the previous day that due to the patient's mental status, I could not complete the healthcare Power of Attorney and that they would need to contact a lawyer to deal with the [place]. This was also addressed previously by another Chaplain. My main concern at this point is that the patient and his wife still do not comprehend the patient's condition. The wife has exhibited anxiety over the patient's finances and is concerned about having access to funds that are in the patient's name, in the event that the patient needs long-term care.  I will contact the social worker and discuss this matter further.      [place] M.Div.

## 2015-01-10 NOTE — Progress Notes (Signed)
   01/10/15 1636  Clinical Encounter Type  Visited With Patient and family together;Health care provider  Visit Type Initial  Referral From Family  Spiritual Encounters  Spiritual Needs Literature  Stress Factors  Family Stress Factors Health changes   Chaplain responded to a request to complete an advanced directive. Patient is still confused and mentally not well enough to complete the documents. Chaplain visited with patient and patient's wife, facilitated life review, and offered support. Patient's wife asked about the patient using a CPAP to sleep at night, and chaplain checked in with the nurse to confirm that we could offer one, and relayed that information to the patient. Chaplain support available as needed.  Jeri Lager, Chaplain 01/10/2015  4:39 PM

## 2015-01-10 NOTE — Progress Notes (Signed)
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 I'm sure this is where the pulmonary embolism came from.  I think with the upcoming procedure that he needs, I probably would put in a filter. I think this would make it safer for him.  He is currently on heparin. There is no obvious GI bleeding. His hemoglobin is 8.4. He is iron deficient. He clearly needs IV iron. His ferritin is only 6 with an iron saturation of 4%.  Otherwise, everything else is pretty stable.  He did have scans done which did not show any evidence of a primary. 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 This is with a biopsy will really help out.  His labs look okay. His vital signs all look okay. Blood pressure 124/62. Temperature 97.9. Pulse is 77.  There really is nothing different on his physical exam.  I will see about radiology put in a filter in him.  We will give him some IV iron.  Aaron White 23:4

## 2015-01-10 NOTE — Progress Notes (Signed)
Pt had blood in his urine again when he voided after the initial occurrence, and it was heavier than the first time. It tapered off after he got back into the bed. When he voided this most recent time, there was no blood noted. Continue to monitor. Hortencia Conradi RN

## 2015-01-10 NOTE — Progress Notes (Signed)
Pt got up to [facility] to void and NT noted a steady stream of bright red blood coming from urethra. Urine in bucket was yellow with streaks of red blood. Pt denies manipulating penis in any way. No trauma noted to penis, and blood seen oozing from urethra. Bleeding has slowed down significantly at this point. Paged on call NP to make her aware of this and the fact that pt is on a heparin drip (level is at goal). She stated to watch the bleeding and let her know if it got any worse. Also notified pharmacist who is monitoring heparin levels of the bleeding. Continue to monitor. Hortencia Conradi RN

## 2015-01-10 NOTE — Progress Notes (Signed)
RT note: Placed patient on nasal CPAP 10 cmH2O per RCP protocol. Patient tolerating well at this time.

## 2015-01-10 NOTE — Progress Notes (Signed)
Report called to Marissa, RN that will be receiving patient at [facility].  Patient stable from AM assessment.  Patient to be transported via [facility] to [facility].

## 2015-01-10 NOTE — Care Management Important Message (Signed)
Important Message  Patient Details  Name: SEBATIAN GRAMBO MRN: [id number] Date of Birth: 1940/08/02   Medicare Important Message Given:  Yes    Camillo Flaming 01/10/2015, 2:04 [place] Message  Patient Details  Name: HAGEN MAKAREWICZ MRN: [id number] Date of Birth: 1940/10/10   Medicare Important Message Given:  Yes    Camillo Flaming 01/10/2015, 2:03 PM

## 2015-01-10 NOTE — Sedation Documentation (Signed)
Moderate sedation not given, sedation narrator used for documentation.

## 2015-01-10 NOTE — Consult Note (Signed)
Chief Complaint: Patient was seen in consultation today for IVC filter placement Chief Complaint  Patient presents with  . Altered Mental Status    Referring Physician(s): Ennever,P  History of Present Illness: Aaron White is a 75 y.o. male with past medical history significant for hypertension, diabetes, GERD, diverticulosis with recent admission for GI bleed, obstructive sleep apnea on CPAP, and urolithiasis. Patient was recently admitted on 12/28 with worsening confusion and subsequent findings of left lower extremity DVT and tumor infiltrating the splenium of the corpus callosum and crossing bilaterally into the posterior medial temporal lobes highly suspicious for glioblastoma multiforme. Further imaging studies also revealed small pulmonary embolus in the left lower lobe, a 3.1 cm mass in the base of the bladder thought to arise from the prostate, and right thyroid nodule. Patient is scheduled to undergo brain biopsy on 1/4 at [facility]. Request now received for IVC filter placement prior to surgery.  History reviewed. No pertinent past medical history.  Past Surgical History  Procedure Laterality Date  . Knee cartilage surgery Left   . Tonsillectomy and adenoidectomy    . Lithotripsy    . Colonoscopy N/A 12/28/2014    Procedure: COLONOSCOPY;  Surgeon: Mauri Pole, MD;  Location: [facility] ENDOSCOPY;  Service: Endoscopy;  Laterality: N/A;    Allergies: Strawberry flavor and Penicillins  Medications: Prior to Admission medications   Medication Sig Start Date End Date Taking? Authorizing Provider  aspirin EC 81 MG tablet Take 1 tablet (81 mg total) by mouth daily. Stop taking for a week, and if no further bleeding can resume Patient taking differently: Take 81 mg by mouth daily.  06/16/11  Yes Shanker Kristeen Mans, MD  Cholecalciferol (VITAMIN D3) 2000 UNITS capsule Take 2,000 Units by mouth daily.   Yes Historical Provider, MD  Cinnamon 500 MG capsule Take 500  mg by mouth 2 (two) times daily.   Yes Historical Provider, MD  esomeprazole (NEXIUM) 20 MG capsule Take 20 mg by mouth daily at 12 noon.   Yes Historical Provider, MD  fluticasone (FLONASE) 50 MCG/ACT nasal spray USE 1 SPRAY IN EACH NOSTRIL TWICE DAILY AS NEEDED FOR ALLERGIES 08/27/14  Yes Historical Provider, MD  hydrocortisone (ANUSOL-HC) 2.5 % rectal cream Place rectally 2 (two) times daily. Patient taking differently: Place 1 application rectally 2 (two) times daily as needed for hemorrhoids.  12/28/14  Yes Kelvin Cellar, MD  metaxalone (SKELAXIN) 800 MG tablet Take 800 mg by mouth daily as needed for muscle spasms. Reported on 12/26/2014 09/23/14  Yes Historical Provider, MD  metFORMIN (GLUCOPHAGE) 1000 MG tablet Take 1,000 mg by mouth 2 (two) times daily with a meal.     Yes Historical Provider, MD  Misc Natural Products (OSTEO BI-FLEX JOINT SHIELD) TABS Take 1 tablet by mouth 2 (two) times daily.    Yes Historical Provider, MD  Multiple Vitamin (MULTIVITAMIN WITH MINERALS) TABS Take 1 tablet by mouth daily.   Yes Historical Provider, MD  OSENI 25-15 MG TABS Take 1 tablet by mouth daily. 10/24/14  Yes Historical Provider, MD  Specialty Vitamins Products (PROSTATE PO) Take 1 tablet by mouth 2 (two) times daily.   Yes Historical Provider, MD  telmisartan-hydrochlorothiazide (MICARDIS HCT) 80-25 MG per tablet Take 1 tablet by mouth daily.     Yes Historical Provider, MD  vitamin E 200 UNIT capsule Take 200 Units by mouth daily.   Yes Historical Provider, MD  traMADol (ULTRAM) 50 MG tablet Take 1 tablet (50 mg  total) by mouth every 6 (six) hours as needed. Patient not taking: Reported on 01/04/2015 11/24/14   Daleen Bo, MD     Family History  Problem Relation Age of Onset  . Heart disease Mother   . Stroke Father     Social History   Social History  . Marital Status: Married    Spouse Name: N/A  . Number of Children: 3  . Years of Education: N/A   Occupational History  .  Teacher [employer]   Social History Main Topics  . Smoking status: Never Smoker   . Smokeless tobacco: Never Used  . Alcohol Use: No  . Drug Use: No  . Sexual Activity: Not Currently   Other Topics Concern  . None   Social History Narrative      Review of Systems see above  Vital Signs: BP 124/62 mmHg  Pulse 77  Temp(Src) 97.9 F (36.6 C) (Oral)  Resp 16  Ht 5\' 10"  (1.778 m)  Wt 210 lb (95.255 kg)  BMI 30.13 kg/m2  SpO2 91%  Physical Exam patient awake, partially alert. Confusion noted. Chest with slightly diminished breath sounds right base, left clear. Heart with regular rate and rhythm. Abdomen soft, positive bowel sounds, nontender. Extremities with no significant edema.  Mallampati Score:     Imaging: Dg Chest 2 View  12/31/2014  CLINICAL DATA:  75 year old male with altered mental status EXAM: CHEST  2 VIEW COMPARISON:  Prior chest x-ray 04/08/2010 FINDINGS: Cardiac and mediastinal contours remain within normal limits. No focal airspace consolidation, pleural effusion, pulmonary edema or pneumothorax. Stable mild bronchitic change. No acute osseous abnormality. IMPRESSION: No active cardiopulmonary disease. Electronically Signed   By: Jacqulynn Cadet M.D.   On: 12/31/2014 15:44   Ct Head Wo Contrast  12/31/2014  CLINICAL DATA:  Worsening confusion EXAM: CT HEAD WITHOUT CONTRAST TECHNIQUE: Contiguous axial images were obtained from the base of the skull through the vertex without intravenous contrast. COMPARISON:  None. FINDINGS: No skull fracture is noted. Paranasal sinuses and mastoid air cells are unremarkable. No intracranial hemorrhage, mass effect or midline shift. No acute cortical infarction. No mass lesion is noted on this unenhanced scan. Mild cerebral atrophy. Tiny lacunar infarct or prominent perivascular space noted in left basal ganglia. IMPRESSION: No acute intracranial abnormality.  Mild cerebral atrophy. Electronically Signed   By: Lahoma Crocker  M.D.   On: 12/31/2014 21:46   Ct Chest W Contrast  01/07/2015  CLINICAL DATA:  Confusion. Recently diagnosed with corpus callosal and posterior medial temporal lobe mass. Assess for primary malignancy. Initial encounter. EXAM: CT CHEST, ABDOMEN, AND PELVIS WITH CONTRAST TECHNIQUE: Multidetector CT imaging of the chest, abdomen and pelvis was performed following the standard protocol during bolus administration of intravenous contrast. CONTRAST:  159mL OMNIPAQUE IOHEXOL 300 MG/ML  SOLN COMPARISON:  Chest radiograph performed 12/31/2014, and CT of the abdomen and pelvis from 04/07/2010 FINDINGS: CT CHEST Minimal bibasilar atelectasis is noted bilaterally. Trace right-sided pleural fluid is noted. The lungs are otherwise clear. No focal consolidation or pneumothorax is seen. No suspicious masses are identified. A small pulmonary embolus is incidentally noted within a segmental branch to the left lower lobe. The mediastinum is unremarkable in appearance. No mediastinal lymphadenopathy is seen. No pericardial effusion is identified. The great vessels are grossly unremarkable in appearance. There is asymmetric prominence of the right thyroid lobe, which may reflect a 2.7 cm nodule. No axillary lymphadenopathy is appreciated. A moderate hiatal hernia is noted. No acute osseous abnormalities  are identified. CT ABDOMEN AND PELVIS A 6 mm hypodensity in the periphery of the right hepatic lobe is nonspecific but may reflect a small cyst. The spleen is unremarkable in appearance. A few stones are seen dependently within the gallbladder. The gallbladder is contracted and otherwise unremarkable. The pancreas and adrenal glands are unremarkable. Several left renal parapelvic cysts are noted. Nonspecific perinephric stranding is noted on the left side. There is no evidence of hydronephrosis. No renal or ureteral stones are seen. No free fluid is identified. The small bowel is unremarkable in appearance. The stomach is within  normal limits. No acute vascular abnormalities are seen. Mild calcification is noted along the abdominal aorta and its branches. The appendix is normal in caliber, without evidence of appendicitis. Contrast progresses to the level of the sigmoid colon. Mild scattered diverticulosis is noted along the distal transverse, descending and proximal sigmoid colon. The distal descending and proximal sigmoid colon extend into a moderate left inguinal hernia, without evidence for obstruction. Extension of the colon into the hernia is more prominent than in 2012. The bladder is mildly distended. There is extension of a 3.1 cm mass into the base of the bladder, thought to arise from the prostate. This is only slightly more prominent than in 2012, and likely reflects the patient's baseline. The prostate is enlarged, measuring 5.3 cm in transverse dimension. No inguinal lymphadenopathy is seen. No acute osseous abnormalities are identified. There is mild grade 1 retrolisthesis of L2 on L3, reflecting underlying facet disease. IMPRESSION: 1. Small pulmonary embolus incidentally noted within a segmental branch to the left lower lung lobe. 2. No suspicious masses seen. 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 3. Extension of 3.1 cm mass into the base of the bladder, thought to arise from the prostate. This is only slightly more prominent than in 2012, and likely reflects the patient's baseline. Enlarged prostate noted. Would correlate with PSA, as deemed clinically appropriate. 4. Extension of the distal descending and proximal sigmoid colon into a moderate left inguinal hernia, without evidence for obstruction. This is somewhat more prominent than in 2012. 5. Trace right-sided pleural fluid, with minimal bibasilar atelectasis. 6. Question of 2.7 cm nodule at the right thyroid lobe. Consider further evaluation with thyroid ultrasound. If patient is clinically  hyperthyroid, consider nuclear medicine thyroid uptake and scan. 7. Several left renal parapelvic cysts noted. 6 mm hypodensity in the periphery of the right hepatic lobe is nonspecific but may reflect a small cyst. 8. Cholelithiasis. Gallbladder contracted and otherwise unremarkable. 9. Mild calcification along the abdominal aorta and its branches. 10. Mild scattered diverticulosis along the distal transverse, descending and proximal sigmoid colon. Critical Value/emergent results were called by telephone at the time of interpretation on 01/07/2015 at 1:26 am to [place] at [facility], who verbally acknowledged these results. Electronically Signed   By: Garald Balding M.D.   On: 01/07/2015 01:28   Mr Brain Wo Contrast  01/05/2015  CLINICAL DATA:  Confusion, worsening over the last several days. EXAM: MRI HEAD WITHOUT CONTRAST TECHNIQUE: Multiplanar, multiecho pulse sequences of the brain and surrounding structures were obtained without intravenous contrast. COMPARISON:  Head CT 12/31/2014 FINDINGS: The brainstem is normal. There is mild cerebellar atrophy but no focal cerebellar insult. The cerebral hemispheres show a background pattern of chronic small vessel disease affecting the deep white matter. 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 No cortical or large  vessel territory infarction. There is a mass lesion infiltrating the splenium of the corpus callosum and extending into the posterior medial temporal lobes bilaterally. There are petechial blood products within the substance of this mass in the region of the splenium to the right of midline. 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 Lymphoma and acute toxic demyelination are considered but felt much less likely. No hydrocephalus. No extra-axial fluid collection. No pituitary mass. Sinuses, middle ears and mastoids are clear. IMPRESSION: Tumor infiltrating the splenium  of the corpus callosum and crossing bilaterally into the posterior medial temporal lobes. Some internal blood products. 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 Electronically Signed   By: Nelson Chimes M.D.   On: 01/05/2015 19:00   US Scrotum  01/04/2015  CLINICAL DATA:  Left testicular pain. EXAM: SCROTAL ULTRASOUND DOPPLER ULTRASOUND OF THE TESTICLES TECHNIQUE: Complete ultrasound examination of the testicles, epididymis, and other scrotal structures was performed. Color and spectral Doppler ultrasound were also utilized to evaluate blood flow to the testicles. COMPARISON:  None. FINDINGS: Right testicle Measurements: 5.0 x 2.4 x 3.7 cm. No mass or microlithiasis visualized. Left testicle Measurements: 5.5 x 2.1 x 2.6 cm. Small intra testicular cyst measuring 4 mm maximally. No mass or microlithiasis visualized. Right epididymis:  Normal in size and appearance. Left epididymis:  Normal in size and appearance. Hydrocele:  None visualized. Varicocele:  None visualized. Pulsed Doppler interrogation of both testes demonstrates normal low resistance arterial and venous waveforms bilaterally. IMPRESSION: Small benign-appearing cyst in the left testicle. Otherwise unremarkable study. Electronically Signed   By: Rolm Baptise M.D.   On: 01/04/2015 19:03   Ct Abdomen Pelvis W Contrast  01/07/2015  CLINICAL DATA:  Confusion. Recently diagnosed with corpus callosal and posterior medial temporal lobe mass. Assess for primary malignancy. Initial encounter. EXAM: CT CHEST, ABDOMEN, AND PELVIS WITH CONTRAST TECHNIQUE: Multidetector CT imaging of the chest, abdomen and pelvis was performed following the standard protocol during bolus administration of intravenous contrast. CONTRAST:  114mL OMNIPAQUE IOHEXOL 300 MG/ML  SOLN COMPARISON:  Chest radiograph performed 12/31/2014, and CT of the abdomen and pelvis from 04/07/2010 FINDINGS: CT CHEST Minimal bibasilar atelectasis is noted bilaterally. Trace  right-sided pleural fluid is noted. The lungs are otherwise clear. No focal consolidation or pneumothorax is seen. No suspicious masses are identified. A small pulmonary embolus is incidentally noted within a segmental branch to the left lower lobe. The mediastinum is unremarkable in appearance. No mediastinal lymphadenopathy is seen. No pericardial effusion is identified. The great vessels are grossly unremarkable in appearance. There is asymmetric prominence of the right thyroid lobe, which may reflect a 2.7 cm nodule. No axillary lymphadenopathy is appreciated. A moderate hiatal hernia is noted. No acute osseous abnormalities are identified. CT ABDOMEN AND PELVIS A 6 mm hypodensity in the periphery of the right hepatic lobe is nonspecific but may reflect a small cyst. The spleen is unremarkable in appearance. A few stones are seen dependently within the gallbladder. The gallbladder is contracted and otherwise unremarkable. The pancreas and adrenal glands are unremarkable. Several left renal parapelvic cysts are noted. Nonspecific perinephric stranding is noted on the left side. There is no evidence of hydronephrosis. No renal or ureteral stones are seen. No free fluid is identified. The small bowel is unremarkable in appearance. The stomach is within normal limits. No acute vascular abnormalities are seen. Mild calcification is noted along the abdominal aorta and its branches. The appendix is normal in caliber, without evidence of appendicitis.  Contrast progresses to the level of the sigmoid colon. Mild scattered diverticulosis is noted along the distal transverse, descending and proximal sigmoid colon. The distal descending and proximal sigmoid colon extend into a moderate left inguinal hernia, without evidence for obstruction. Extension of the colon into the hernia is more prominent than in 2012. The bladder is mildly distended. There is extension of a 3.1 cm mass into the base of the bladder, thought to arise  from the prostate. This is only slightly more prominent than in 2012, and likely reflects the patient's baseline. The prostate is enlarged, measuring 5.3 cm in transverse dimension. No inguinal lymphadenopathy is seen. No acute osseous abnormalities are identified. There is mild grade 1 retrolisthesis of L2 on L3, reflecting underlying facet disease. IMPRESSION: 1. Small pulmonary embolus incidentally noted within a segmental branch to the left lower lung lobe. 2. No suspicious masses seen. 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 3. Extension of 3.1 cm mass into the base of the bladder, thought to arise from the prostate. This is only slightly more prominent than in 2012, and likely reflects the patient's baseline. Enlarged prostate noted. Would correlate with PSA, as deemed clinically appropriate. 4. Extension of the distal descending and proximal sigmoid colon into a moderate left inguinal hernia, without evidence for obstruction. This is somewhat more prominent than in 2012. 5. Trace right-sided pleural fluid, with minimal bibasilar atelectasis. 6. Question of 2.7 cm nodule at the right thyroid lobe. Consider further evaluation with thyroid ultrasound. If patient is clinically hyperthyroid, consider nuclear medicine thyroid uptake and scan. 7. Several left renal parapelvic cysts noted. 6 mm hypodensity in the periphery of the right hepatic lobe is nonspecific but may reflect a small cyst. 8. Cholelithiasis. Gallbladder contracted and otherwise unremarkable. 9. Mild calcification along the abdominal aorta and its branches. 10. Mild scattered diverticulosis along the distal transverse, descending and proximal sigmoid colon. Critical Value/emergent results were called by telephone at the time of interpretation on 01/07/2015 at 1:26 am to [place] at [facility], who verbally acknowledged these results. Electronically Signed   By: Garald Balding M.D.   On: 01/07/2015 01:28   [place] Art/ven Flow Abd Pelv Doppler  01/04/2015  CLINICAL DATA:  Left testicular pain. EXAM: SCROTAL ULTRASOUND DOPPLER ULTRASOUND OF THE TESTICLES TECHNIQUE: Complete ultrasound examination of the testicles, epididymis, and other scrotal structures was performed. Color and spectral Doppler ultrasound were also utilized to evaluate blood flow to the testicles. COMPARISON:  None. FINDINGS: Right testicle Measurements: 5.0 x 2.4 x 3.7 cm. No mass or microlithiasis visualized. Left testicle Measurements: 5.5 x 2.1 x 2.6 cm. Small intra testicular cyst measuring 4 mm maximally. No mass or microlithiasis visualized. Right epididymis:  Normal in size and appearance. Left epididymis:  Normal in size and appearance. Hydrocele:  None visualized. Varicocele:  None visualized. Pulsed Doppler interrogation of both testes demonstrates normal low resistance arterial and venous waveforms bilaterally. IMPRESSION: Small benign-appearing cyst in the left testicle. Otherwise unremarkable study. Electronically Signed   By: Rolm Baptise M.D.   On: 01/04/2015 19:03    Labs:  CBC:  Recent Labs  01/07/15 0150 01/08/15 0506 01/09/15 0508 01/10/15 0050  WBC 6.6 6.7 8.0 10.0  HGB 8.8* 8.4* 7.8* 8.4*  HCT 26.6* 25.8* 24.1* 26.3*  PLT 276 252 243 285    COAGS:  Recent Labs  01/07/15 0150  INR 1.27  APTT 25    BMP:  Recent Labs  01/07/15 0150 01/08/15 0506 01/09/15 0508 01/10/15 0050  NA 138 140 143 142  K 4.1 3.6 3.7 3.7  CL 103 106 110 109  CO2 28 24 26 25   GLUCOSE 141* 179* 135* 217*  BUN 12 12 13 13   CALCIUM 8.6* 8.7* 8.8* 8.8*  CREATININE 1.07 0.80 0.92 0.88  GFRNONAA >60 >60 >60 >60  GFRAA >60 >60 >60 >60    LIVER FUNCTION TESTS:  Recent Labs  01/04/15 1418 01/05/15 0400 01/06/15 0535 01/07/15 0150  BILITOT 0.6 1.2 0.9 1.1  AST 17 23 17 18   ALT 23 25 20 20   ALKPHOS 49 63 48 46  PROT 6.2* 7.6 5.7* 6.1*  ALBUMIN 3.6 4.3 3.1* 3.4*    TUMOR  MARKERS: No results for input(s): AFPTM, CEA, CA199, CHROMGRNA in the last 8760 hours.  Assessment and Plan: Patient with past medical history significant for hypertension, diabetes, GERD, diverticulosis, obstructive sleep apnea , prior GI bleed; now with worsening confusion and newly diagnosed tumor infiltrating the splenium of the corpus callosum and crossing bilaterally into the posterior medial temporal lobe suspicious for glioblastoma multiforme , PE, and left lower extremity DVT. Patient was on IV heparin however there has been some hematuria noted and has therefore been discontinued at this time. Patient tentatively scheduled to undergo brain biopsy at [facility] on 1/4 by neurosurgery. Request now made for IVC filter placement.Risks and benefits discussed with the patient/wife including, but not limited to bleeding, infection, contrast induced renal failure, filter fracture or migration which can lead to emergency surgery or even death, strut penetration with damage or irritation to adjacent structures and caval thrombosis. All of the patient's questions were answered, patient is agreeable to proceed.Consent signed and in chart. Procedure scheduled for today.     Thank you for this interesting consult.  I greatly enjoyed meeting VINCIL EELLS and look forward to participating in their care.  A copy of this report was sent to the requesting provider on this date.  Signed: D. Rowe Robert 01/10/2015, 9:59 AM   I spent a total of 20 minutes in face to face in clinical consultation, greater than 50% of which was counseling/coordinating care for IVC filter placement

## 2015-01-10 NOTE — Consult Note (Signed)
Urology Consult   Physician requesting consult: Ghimire, MD  Reason for consult: Hematuria  History of Present Illness: Aaron White is a 75 y.o. 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 He was also noted to have a PE/DVT and was started on a heparin GTT on admission which was stopped today because of sudden onset gross hematuria. He was noted on CT imaging to have an enlarged prostate gland with what appears to be a large intra-vesical median lobe component versus intravesical growth of prostatic tumor versus bladder tumor. No hydroneprhosis bilaterally. He was started on flomax. Per report he was not found to be in urinary retention He was transferred to [facility] for pending brain biopsy. Of note he had an IVC filter placed today. He was started on flomax per primary team. Urinalysis is without significant infectious parameters. Urine culture was sent.  Interview is extremely limited by patients confusion (unsure whether he has seen a urologist in the recent past, claims he has but wife contradicts). Has been taking OTC prostate supplements. Nocturia x4-5, intermittency, hesitency, post-void dribble c/w obstruction.  PVR 432.  He does not have a urologist. His past urologic history is significant for BPH.  He denies a history of GU malignancy/trauma/surgery.  History reviewed. No pertinent past medical history.  Past Surgical History  Procedure Laterality Date  . Knee cartilage surgery Left   . Tonsillectomy and adenoidectomy    . Lithotripsy    . Colonoscopy N/A 12/28/2014    Procedure: COLONOSCOPY;  Surgeon: Mauri Pole, MD;  Location: [facility] ENDOSCOPY;  Service: Endoscopy;  Laterality: N/A;    Current Hospital  Medications:  Home Meds:    Medication List    ASK your doctor about these medications        aspirin EC 81 MG tablet  Take 1 tablet (81 mg total) by mouth daily. Stop taking for a week, and if no further bleeding can resume     Cinnamon 500 MG capsule  Take 500 mg by mouth 2 (two) times daily.     esomeprazole 20 MG capsule  Commonly known as:  NEXIUM  Take 20 mg by mouth daily at 12 noon.     fluticasone 50 MCG/ACT nasal spray  Commonly known as:  FLONASE  USE 1 SPRAY IN EACH NOSTRIL TWICE DAILY AS NEEDED FOR ALLERGIES     hydrocortisone 2.5 % rectal cream  Commonly known as:  ANUSOL-HC  Place rectally 2 (two) times daily.     metaxalone 800 MG tablet  Commonly known as:  SKELAXIN  Take 800 mg by mouth daily as needed for muscle spasms. Reported on 12/26/2014     metFORMIN 1000 MG tablet  Commonly known as:  GLUCOPHAGE  Take 1,000 mg by mouth 2 (two) times daily with a meal.     multivitamin with minerals Tabs tablet  Take 1 tablet by mouth daily.     OSENI 25-15 MG Tabs  Generic drug:  Alogliptin-Pioglitazone  Take 1 tablet by mouth daily.     OSTEO BI-FLEX JOINT SHIELD Tabs  Take 1 tablet by mouth 2 (two) times daily.     PROSTATE PO  Take 1 tablet by mouth 2 (two) times daily.     telmisartan-hydrochlorothiazide 80-25 MG tablet  Commonly known  as:  MICARDIS HCT  Take 1 tablet by mouth daily.     traMADol 50 MG tablet  Commonly known as:  ULTRAM  Take 1 tablet (50 mg total) by mouth every 6 (six) hours as needed.     Vitamin D3 2000 units capsule  Take 2,000 Units by mouth daily.     vitamin E 200 UNIT capsule  Take 200 Units by mouth daily.        Scheduled Meds: . sodium chloride   Intravenous Once  . sodium chloride   Intravenous Once  . cholecalciferol  2,000 Units Oral Daily  . feeding supplement (GLUCERNA SHAKE)  237 mL Oral TID BM  . fluticasone  1 spray Each Nare Daily  . insulin aspart  0-9 Units Subcutaneous TID WC  . irbesartan   300 mg Oral Daily  . lidocaine-EPINEPHrine      . multivitamin with minerals  1 tablet Oral Daily  . pantoprazole  40 mg Oral Daily  . risperiDONE  0.5 mg Oral QHS  . sertraline  25 mg Oral Daily  . sodium chloride  3 mL Intravenous Q12H  . tamsulosin  0.4 mg Oral Daily  . vitamin E  200 Units Oral Daily   Continuous Infusions: . 0.9 % NaCl with KCl 20 mEq / L 50 mL/hr at 01/09/15 0820   PRN Meds:.ALPRAZolam, hydrocortisone, ondansetron **OR** ondansetron (ZOFRAN) IV, polyvinyl alcohol  Allergies:  Allergies  Allergen Reactions  . Strawberry Flavor Hives  . Penicillins Rash    Has patient had a PCN reaction causing immediate rash, facial/tongue/throat swelling, SOB or lightheadedness with hypotension: No Has patient had a PCN reaction causing severe rash involving mucus membranes or skin necrosis: No Has patient had a PCN reaction that required hospitalization No Has patient had a PCN reaction occurring within the last 10 years: No If all of the above answers are "NO", then may proceed with Cephalosporin use.     Family History  Problem Relation Age of Onset  . Heart disease Mother   . Stroke Father     Social History:  reports that he has never smoked. He has never used smokeless tobacco. He reports that he does not drink alcohol or use illicit drugs.  ROS: A complete review of systems was performed.  All systems are negative except for pertinent findings as noted.  Physical Exam:  Vital signs in last 24 hours: Temp:  [97.9 F (36.6 C)-98.3 F (36.8 C)] 98.3 F (36.8 C) (01/03 1420) Pulse Rate:  [73-77] 76 (01/03 1420) Resp:  [16] 16 (01/03 0408) BP: (124-147)/(62-73) 147/73 mmHg (01/03 1420) SpO2:  [91 %-100 %] 100 % (01/03 1420) Constitutional:  Alert and oriented, No acute distress Cardiovascular: Regular rate and rhythm, No JVD Respiratory: Normal respiratory effort, Lungs clear bilaterally GI: Abdomen is soft, nontender, nondistended, no abdominal masses GU:  No CVA tenderness, 2+ prostatic hypertrophy, no nodules, normal tone Lymphatic: No lymphadenopathy Neurologic: Grossly intact, no focal deficits Psychiatric: Normal mood and affect  Laboratory Data:   Recent Labs  01/08/15 0506 01/09/15 0508 01/10/15 0050 01/10/15 1006  WBC 6.7 8.0 10.0 9.8  HGB 8.4* 7.8* 8.4* 8.4*  HCT 25.8* 24.1* 26.3* 26.2*  PLT 252 243 285 277     Recent Labs  01/08/15 0506 01/09/15 0508 01/10/15 0050  NA 140 143 142  K 3.6 3.7 3.7  CL 106 110 109  GLUCOSE 179* 135* 217*  BUN 12 13 13   CALCIUM 8.7* 8.8* 8.8*  CREATININE 0.80  0.92 0.88     Results for orders placed or performed during the hospital encounter of 01/04/15 (from the past 24 hour(s))  Glucose, capillary     Status: Abnormal   Collection Time: 01/09/15  5:36 PM  Result Value Ref Range   Glucose-Capillary 189 (H) 65 - 99 mg/dL   Comment 1 Notify RN    Comment 2 Document in Chart   Glucose, capillary     Status: Abnormal   Collection Time: 01/09/15 10:15 PM  Result Value Ref Range   Glucose-Capillary 216 (H) 65 - 99 mg/dL   Comment 1 Notify RN   Heparin level (unfractionated)     Status: None   Collection Time: 01/10/15 12:50 AM  Result Value Ref Range   Heparin Unfractionated 0.44 0.30 - 0.70 IU/mL  CBC     Status: Abnormal   Collection Time: 01/10/15 12:50 AM  Result Value Ref Range   WBC 10.0 4.0 - 10.5 K/uL   RBC 2.88 (L) 4.22 - 5.81 MIL/uL   Hemoglobin 8.4 (L) 13.0 - 17.0 g/dL   HCT 26.3 (L) 39.0 - 52.0 %   MCV 91.3 78.0 - 100.0 fL   MCH 29.2 26.0 - 34.0 pg   MCHC 31.9 30.0 - 36.0 g/dL   RDW 14.3 11.5 - 15.5 %   Platelets 285 150 - 400 K/uL  Basic metabolic panel     Status: Abnormal   Collection Time: 01/10/15 12:50 AM  Result Value Ref Range   Sodium 142 135 - 145 mmol/L   Potassium 3.7 3.5 - 5.1 mmol/L   Chloride 109 101 - 111 mmol/L   CO2 25 22 - 32 mmol/L   Glucose, Bld 217 (H) 65 - 99 mg/dL   BUN 13 6 - 20 mg/dL   Creatinine, Ser 0.88 0.61 - 1.24 mg/dL    Calcium 8.8 (L) 8.9 - 10.3 mg/dL   GFR calc non Af Amer >60 >60 mL/min   GFR calc Af Amer >60 >60 mL/min   Anion gap 8 5 - 15  Glucose, capillary     Status: Abnormal   Collection Time: 01/10/15  7:22 AM  Result Value Ref Range   Glucose-Capillary 162 (H) 65 - 99 mg/dL   Comment 1 Notify RN    Comment 2 Document in Chart   CBC     Status: Abnormal   Collection Time: 01/10/15 10:06 AM  Result Value Ref Range   WBC 9.8 4.0 - 10.5 K/uL   RBC 2.93 (L) 4.22 - 5.81 MIL/uL   Hemoglobin 8.4 (L) 13.0 - 17.0 g/dL   HCT 26.2 (L) 39.0 - 52.0 %   MCV 89.4 78.0 - 100.0 fL   MCH 28.7 26.0 - 34.0 pg   MCHC 32.1 30.0 - 36.0 g/dL   RDW 14.1 11.5 - 15.5 %   Platelets 277 150 - 400 K/uL  Glucose, capillary     Status: Abnormal   Collection Time: 01/10/15 11:57 AM  Result Value Ref Range   Glucose-Capillary 146 (H) 65 - 99 mg/dL   Comment 1 Notify RN    Comment 2 Document in Chart   Urinalysis, Routine w reflex microscopic (not at [facility])     Status: Abnormal   Collection Time: 01/10/15  2:31 PM  Result Value Ref Range   Color, Urine YELLOW YELLOW   APPearance CLEAR CLEAR   Specific Gravity, Urine 1.022 1.005 - 1.030   pH 7.5 5.0 - 8.0   Glucose, UA NEGATIVE NEGATIVE mg/dL  Hgb urine dipstick LARGE (A) NEGATIVE   Bilirubin Urine NEGATIVE NEGATIVE   Ketones, ur NEGATIVE NEGATIVE mg/dL   Protein, ur NEGATIVE NEGATIVE mg/dL   Nitrite NEGATIVE NEGATIVE   Leukocytes, UA NEGATIVE NEGATIVE  Urine microscopic-add on     Status: Abnormal   Collection Time: 01/10/15  2:31 PM  Result Value Ref Range   Squamous Epithelial / LPF 0-5 (A) NONE SEEN   WBC, UA 0-5 0 - 5 WBC/hpf   RBC / HPF TOO NUMEROUS TO COUNT 0 - 5 RBC/hpf   Bacteria, UA RARE (A) NONE SEEN   Recent Results (from the past 240 hour(s))  Urine culture     Status: None   Collection Time: 12/31/14  4:31 PM  Result Value Ref Range Status   Specimen Description URINE, CLEAN CATCH  Final   Special Requests NONE  Final   Culture MULTIPLE  SPECIES PRESENT, SUGGEST RECOLLECTION  Final   Report Status 01/01/2015 FINAL  Final    Renal Function:  Recent Labs  01/04/15 1418 01/05/15 0400 01/06/15 0535 01/07/15 0150 01/08/15 0506 01/09/15 0508 01/10/15 0050  CREATININE 0.89 0.82 0.96 1.07 0.80 0.92 0.88   Estimated Creatinine Clearance: 84 mL/min (by C-G formula based on Cr of 0.88).  Radiologic Imaging: Ir Ivc Filter Plmt / S&i /img Guid/mod Sed  01/10/2015  INDICATION: History of pulmonary embolism and lower extremity DVT. Patient with recent MR findings concerning for either GBM or CNS lymphoma. Request made for placement of an IVC filter for (at least temporary) caval interruption. EXAM: ULTRASOUND GUIDANCE FOR VASCULAR ACCESS IVC CATHETERIZATION AND VENOGRAM IVC FILTER INSERTION COMPARISON:  CT the chest, abdomen pelvis - 01/06/2015; brain MRI -01/05/2015 MEDICATIONS: Fentanyl 50 mcg IV ANESTHESIA/SEDATION: Sedation Time 15 minutes CONTRAST:  60 cc Omnipaque 300 FLUOROSCOPY TIME:  48 minutes (81 mGy) COMPLICATIONS: None immediate PROCEDURE: Informed consent was obtained from the patient following explanation of the procedure, risks, benefits and alternatives. The patient understands, agrees and consents for the procedure. All questions were addressed. A time out was performed prior to the initiation of the procedure. Maximal barrier sterile technique utilized including caps, mask, sterile gowns, sterile gloves, large sterile drape, hand hygiene, and Betadine prep. Under sterile condition and local anesthesia, right internal jugular venous access was performed with ultrasound. An ultrasound image was saved and sent to PACS. Over a guidewire, the IVC filter delivery sheath and inner dilator were advanced into the IVC just above the IVC bifurcation. Contrast injection was performed for an IVC venogram. Through the delivery sheath, a retrievable Denali IVC filter was deployed below the level of the renal veins and above the IVC  bifurcation. Limited post deployment venacavagram was performed. The delivery sheath was removed and hemostasis was obtained with manual compression. A dressing was placed. The patient tolerated the procedure well without immediate post procedural complication. FINDINGS: The IVC is patent. No evidence of thrombus, stenosis, or occlusion. No variant venous anatomy. Successful placement of the IVC filter below the level of the renal veins. IMPRESSION: Successful ultrasound and fluoroscopically guided placement of an infrarenal retrievable IVC filter via right jugular approach. This IVC filter is potentially retrievable. The patient will be assessed for filter retrieval by Interventional Radiology in approximately 8-12 weeks. Further recommendations regarding filter retrieval, continued surveillance or declaration of device permanence, will be made at that time. Electronically Signed   By: Sandi Mariscal M.D.   On: 01/10/2015 14:18   CT abdomen/Pelvis 01/06/15  IMPRESSION: 1. Small pulmonary embolus incidentally noted within  a segmental branch to the left lower lung lobe. 2. No suspicious masses seen. 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 3. Extension of 3.1 cm mass into the base of the bladder, thought to arise from the prostate. This is only slightly more prominent than in 2012, and likely reflects the patient's baseline. Enlarged prostate noted. Would correlate with PSA, as deemed clinically appropriate. 4. Extension of the distal descending and proximal sigmoid colon into a moderate left inguinal hernia, without evidence for obstruction. This is somewhat more prominent than in 2012. 5. Trace right-sided pleural fluid, with minimal bibasilar atelectasis. 6. Question of 2.7 cm nodule at the right thyroid lobe. Consider further evaluation with thyroid ultrasound. If patient is clinically hyperthyroid, consider nuclear medicine  thyroid uptake and scan. 7. Several left renal parapelvic cysts noted. 6 mm hypodensity in the periphery of the right hepatic lobe is nonspecific but may reflect a small cyst. 8. Cholelithiasis. Gallbladder contracted and otherwise unremarkable. 9. Mild calcification along the abdominal aorta and its branches. 10. Mild scattered diverticulosis along the distal transverse, descending and proximal sigmoid colon.  I independently reviewed the above imaging studies.  Impression/Recommendation  74yM with history of BPH admitted for acute encephalopathy with brain lesions as well as PE/DVT and gross hematuria following initiation of heparin GTT. Heparin now off with IVC filter placed today. Intravesical growth on contrasted CT imaging likely representing a large median lobe component (unable to rule out prostate tumor versus bladder tumor, but appears contiguous with prostate). Bladder appears decompressed without any evidence of hydronephrosis bilaterally. LEFT small parapelvic cysts but no solid or enhancing renal lesions, no filling defects in the renal pevlises and proximal ureters bilaterally, contrast not seen below the proximal/mid ureteral junction bilaterally on delayed phase imaging as the images cut off here.  PSA 2.69. Urinalysis without infectious parameters. No foley at present but PVR elevated at 432. Urine is clear.  The differential diagnosis for hematuria was reviewed.  Possible etiologies include, but are not limited to urolithiasis, infection, urothelial or renal malignancies, medical renal disease, and benign idiopathic hematuria. Given the clinical scenario the most likely diagnosis is BPH with prostatic bleeding exacerbated by therapeutic anticoagulation, however given the possibility that his brain lesions may represent CNS lymphoma, GU involvement of the prostate or bladder (other primary Gu malignancy) cannot be ruled out (although this would be rare). He will need to undergo  cystoscopy with possible biopsy once his clinical scenario improves and stabilizes. We also need to ensure the patient is able to void with appropriately low PVRs.  His current medical issues are paramount to his voiding issues/hematuria. Primary management should be directed at ensuring he is emptying his bladder with acceptable PVRs (<300), foley catheter management will be necessary for PVRs >500. He will need a cystoscopy to complete his hematuria work-up as well as to evaluate what I suspect is a large median lobe component. He may ultimately benefit from an outlet procedure (TURP vs. Greenlight laser ablation of the prostate). This will be addressed once he has stabilized from his more pressing conditions.  -Continue flomax 0.4mg  -Start Proscar 5 mg PO daily  -PVR x 3, nursing to record values in chart and to inform urology if >500 mL. If >500 mL with require indwelling foley catheter and monitoring for clots (18 Fr Coude catheter--nurse to place). -Follow-up culture results -Limit anticoagulation if at all possible -Urology will follow peripherally and once medically fit -Cystourethroscopy can be performed  as an outpatient -Urology will follow peripherally and once medically fit can proceed with the above outline work-up in non-urgent fashion   Discussed with Dr. Thereasa Solo 01/10/2015, 3:53 PM

## 2015-01-10 NOTE — Procedures (Signed)
Successful placement of an infrarenal IVC filter.  No immediate complications.   Jay Herlinda Heady, MD Pager #: [phone]   

## 2015-01-10 NOTE — Progress Notes (Signed)
PHARMACIST - PHYSICIAN COMMUNICATION CONCERNING:  IV heparin  77 yoM on IV heparin for PE with lower goal rate 0.3-0.5 due to recent hospitalization for GIB.  Heparin level tonight is slightly supratherapeutic @ 0.53.  No bleeding or infusion issues per RN.   Assessement:  [id number] HL=0.44 units/ml  At goal of 0.3-0.5 units/ml  Plan:  Continue heparin drip @ 1300 units/hr  Recheck at 10 am to ensure stays within range  Dorrene German 01/10/2015, 2:06 AM

## 2015-01-10 NOTE — Progress Notes (Addendum)
Patient ID: Aaron White, male   DOB: [birth year], 75 y.o.   MRN: [id number] Aaron White PROGRESS NOTE  Aaron White [id number] DOB: October 24, 1940 DOA: 01/04/2015 PCP: Aaron Pel, MD  Brief narrative:    75 y.o. male with past medical history significant for hypertension, diabetes, obstructive sleep apnea on CPAP, recently hospitalized for GI bleed who now presented to [facility] long hospital because of worsening confusion over past few days prior to this admission. Per patient's family, ever since discharge 12/26/2014 patient was not really himself. Patient would say things such as him being afraid of dying and that he is losing the mammary and he wanted to see his son in [place] but was worried as he has a fear of flying.   Patient was hemodynamically stable on the admission. Blood work was significant for hemoglobin of 8.1, otherwise unremarkable. He was admitted for further evaluation of acute encephalopathy. 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 Neurosurgery, radiation oncology and oncology consulted.    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  - Neurosurgery, radiation oncology and oncology consulted. Per oncology, pt needs biopsy before any treatment plan can be instituted.  - Dr. Arnoldo White of neurosurgery  To do biopsy on 1/4, keep npo after midnight on 1/3 Dr Aaron White advised to transfer patient to Aaron White on 1/3, per Dr. Arnoldo White biopsy will be in the afternoon on 1/4, after biopsy patient will be observed at Aaron White at least  one night.  I have made flow manager Aaron White aware , signed out to Aaron White team 4 (Dr. Sloan White) at [facility].  PE/DVT:  started on heparin drip on admission, Heparin drip stopped on 1/3 due to new onset of gross hematuria,  Venous US  +for DVT in left posterial tibial vein,  ivc filter placed by IR on 1/3 at Aaron White   Gross hematuria/3.1 cm bladder vs prostate mass:  Heparin drip stopped, ua/urine culture ordered on 1/3, consider abx if hematuria persists or fever, urology consulted, Dr Aaron White will see patient on 1/3  Anemia of chronic disease and iron deficiency anemia and blood loss anemia for hematuria, dose has h/o gi bleed from hemorrhoids/diverticula, stool occult blood this admission negative. - Recent GI bleed and colonoscopy 12/28/2014 with finding of hemorrhoids -- Aspirin placed on hold, Continue protonix daily  - Transfuse 1 unit PRBC on 12/30, 1uint on 1/3 -s/p iv iron , stool occult blood daily -transfuse prbc to keep hgb>7.  Frequent urination and prostate pain?  Enlarged prostate on ct ab: ua unremarkable, psa 4.3 on 12/29 and 2.69 on 1/1 , started flomax, urology consulted.   Essential hypertension - bp stable so far, hold HCTZ due to poor oral intake, continue avapro -on gentle hydration due to poor oral intake  Controlled diabetes mellitus type 2 without complications without long-term insulin use - Continue SSI, held metformin - Aaron White  Depression/anxiety - Continue risperidone 0.5 mg at bedtime, xanax q8hrs prn and qhs prn - Psych consulted 01/07/2015 , recommended trial of zoloft    DVT Prophylaxis  - SCD's , was on heparin drip, now s/p ivf filter  Code Status:  Full.  Family Communication:  plan of care discussed with the patient and wife in room Disposition Plan: transfer to Aaron White, Needs further evaluation of possible GBM, unknown when he will be discharged, may eventually need SNF  IV access:   Peripheral IV  Procedures and diagnostic studies:    US Scrotum 01/04/2015  Small benign-appearing cyst in the left testicle. Otherwise unremarkable study.   [place] Art/ven Flow Abd Pelv Doppler 01/04/2015  Small benign-appearing cyst in the left testicle. Otherwise unremarkable study.   Medical Consultants:  Psychiatry Nerosurgery - Dr. Arnoldo White Oncology - Dr. Marin White Radiation oncology   Urology Dr. Louis White  Other Consultants:  PT   IAnti-Infectives:   None    Aaron Gamblin, MD PhD Aaron White Pager [phone]  Time spent in minutes: 35 minutes, more than 50% time spent on coordination of care  If 7PM-7AM, please contact night-coverage [email or web address] Password [email or web address] 01/10/2015, 8:57 AM   LOS: 6 days    HPI/Subjective: Reported gross hematuria and dysuria this am, denies fever, no ab pain,   Very pleasant, but still confused, wife in room  Objective: Filed Vitals:   01/09/15 0605 01/09/15 1430 01/09/15 2024 01/10/15 0408  BP: 133/60 116/58 127/64 124/62  Pulse: 57 75 73 77  Temp: 98.5 F (36.9 C) 98.6 F (37 C) 98.2 F (36.8 C) 97.9 F (36.6 C)  TempSrc: Axillary Oral Oral Oral  Resp: 16 18 16 16   Height:      Weight:      SpO2: 98% 95% 93% 91%    Intake/Output Summary (Last 24 hours) at 01/10/15 0857 Last data filed at 01/10/15 0511  Gross per 24 hour  Intake    480 ml  Output    525 ml  Net    -45 ml    Exam:   General:  Pt is alert, pleasant, not oriented, follows commands appropriately, not in acute distress  Cardiovascular: Regular rate and rhythm, S1/S2, no murmurs  Respiratory: Clear to auscultation bilaterally, no wheezing, no crackles, no rhonchi  Abdomen: Soft, non tender, non distended, bowel sounds present  Extremities: No edema, pulses DP and PT palpable bilaterally  Neuro: Grossly nonfocal,not oriented  Data Reviewed: Basic Metabolic Panel:  Recent Labs Lab 01/05/15 0400 01/06/15 0535 01/07/15 0150 01/08/15 0506  01/09/15 0508 01/10/15 0050  NA 142 139 138 140 143 142  K 3.3* 4.0 4.1 3.6 3.7 3.7  CL 104 107 103 106 110 109  CO2 29 26 28 24 26 25   GLUCOSE 203* 163* 141* 179* 135* 217*  BUN 10 11 12 12 13 13   CREATININE 0.82 0.96 1.07 0.80 0.92 0.88  CALCIUM 9.4 8.6* 8.6* 8.7* 8.8* 8.8*  MG 1.8  --   --  1.8 1.9  --   PHOS 2.9  --   --   --   --   --    Liver Function Tests:  Recent Labs Lab 01/04/15 1418 01/05/15 0400 01/06/15 0535 01/07/15 0150  AST 17 23 17 18   ALT 23 25 20 20   ALKPHOS 49 63 48 46  BILITOT 0.6 1.2 0.9 1.1  PROT 6.2* 7.6 5.7* 6.1*  ALBUMIN 3.6 4.3 3.1* 3.4*   No results for input(s): LIPASE, AMYLASE in the last 168 hours. No results for input(s): AMMONIA in the last 168 hours. CBC:  Recent Labs Lab 01/05/15 0400 01/06/15 0535 01/07/15 0150 01/08/15 0506 01/09/15 0508 01/10/15 0050  WBC 5.9 5.9 6.6 6.7 8.0 10.0  NEUTROABS 3.7 3.6 4.8  --   --   --  HGB 9.4* 7.4* 8.8* 8.4* 7.8* 8.4*  HCT 29.8* 23.0* 26.6* 25.8* 24.1* 26.3*  MCV 88.7 88.8 87.8 86.9 88.3 91.3  PLT 339 267 276 252 243 285   Cardiac Enzymes: No results for input(s): CKTOTAL, CKMB, CKMBINDEX, TROPONINI in the last 168 hours. BNP: Invalid input(s): POCBNP CBG:  Recent Labs Lab 01/09/15 1213 01/09/15 1214 01/09/15 1736 01/09/15 2215 01/10/15 0722  GLUCAP 158* 156* 189* 216* 162*    Recent Results (from the past 240 hour(s))  Urine culture     Status: None   Collection Time: 12/31/14  4:31 PM  Result Value Ref Range Status   Specimen Description URINE, CLEAN CATCH  Final   Special Requests NONE  Final   Culture MULTIPLE SPECIES PRESENT, SUGGEST RECOLLECTION  Final   Report Status 01/01/2015 FINAL  Final     Scheduled Meds: . White  1 tablet Oral Daily  . aspirin EC  81 mg Oral Daily  . cholecalciferol  2,000 Units Oral Daily  . fluticasone  1 spray Each Nare Daily  . irbesartan  300 mg Oral Daily   And  . hydrochlorothiazide  25 mg Oral Daily  .  metFORMIN  1,000 mg Oral BID WC  . multivitamin with minerals  1 tablet Oral Daily  . pantoprazole  40 mg Oral Daily  . potassium chloride SA  40 mEq Oral Once  . risperiDONE  0.5 mg Oral QHS  . vitamin E  200 Units Oral Daily   Continuous Infusions: . 0.9 % NaCl with KCl 20 mEq / L 50 mL/hr at 01/09/15 0820  . heparin 1,300 Units/hr (01/10/15 0450)

## 2015-01-10 NOTE — Plan of Care (Signed)
RN, Claiborne Billings, called this NP with MRI brain results. Asked Claiborne Billings to call results to neurosurgery since they ordered the test.

## 2015-01-11 ENCOUNTER — Inpatient Hospital Stay (HOSPITAL_COMMUNITY): Payer: 59 | Admitting: Certified Registered Nurse Anesthetist

## 2015-01-11 ENCOUNTER — Encounter (HOSPITAL_COMMUNITY): Payer: Self-pay | Admitting: Certified Registered Nurse Anesthetist

## 2015-01-11 ENCOUNTER — Inpatient Hospital Stay (HOSPITAL_COMMUNITY): Payer: 59

## 2015-01-11 ENCOUNTER — Encounter (HOSPITAL_COMMUNITY)
Admission: EM | Disposition: A | Discharge status: Hospice | Payer: Self-pay | Source: Home / Self Care | Attending: Internal Medicine

## 2015-01-11 DIAGNOSIS — D496 Neoplasm of unspecified behavior of brain: Secondary | ICD-10-CM | POA: Diagnosis present

## 2015-01-11 DIAGNOSIS — G4730 Sleep apnea, unspecified: Secondary | ICD-10-CM

## 2015-01-11 HISTORY — PX: APPLICATION OF CRANIAL NAVIGATION: SHX6578

## 2015-01-11 HISTORY — PX: PR DURAL GRAFT REPAIR,SPINE DEFECT: 63710

## 2015-01-11 LAB — BASIC METABOLIC PANEL
Anion gap: 9 (ref 5–15)
BUN: 7 mg/dL (ref 6–20)
CO2: 25 mmol/L (ref 22–32)
Calcium: 8.9 mg/dL (ref 8.9–10.3)
Chloride: 109 mmol/L (ref 101–111)
Creatinine, Ser: 0.84 mg/dL (ref 0.61–1.24)
GFR calc Af Amer: >60 mL/min (ref >60)
GLUCOSE: 161 mg/dL — AB (ref 65–99)
POTASSIUM: 4 mmol/L (ref 3.5–5.1)
Sodium: 143 mmol/L (ref 135–145)

## 2015-01-11 LAB — TYPE AND SCREEN
ABO/RH(D): O POS
Antibody Screen: NEGATIVE
UNIT DIVISION: 0

## 2015-01-11 LAB — GLUCOSE, CAPILLARY
GLUCOSE-CAPILLARY: 158 mg/dL — AB (ref 65–99)
Glucose-Capillary: 146 mg/dL — ABNORMAL HIGH (ref 65–99)
Glucose-Capillary: 188 mg/dL — ABNORMAL HIGH (ref 65–99)
Glucose-Capillary: 133 mg/dL — ABNORMAL HIGH (ref 65–99)

## 2015-01-11 LAB — CBC
HCT: 29.6 % — ABNORMAL LOW (ref 39.0–52.0)
Hemoglobin: 9.5 g/dL — ABNORMAL LOW (ref 13.0–17.0)
MCH: 28.6 pg (ref 26.0–34.0)
MCHC: 32.1 g/dL (ref 30.0–36.0)
MCV: 89.2 fL (ref 78.0–100.0)
PLATELETS: 232 10*3/uL (ref 150–400)
RBC: 3.32 MIL/uL — ABNORMAL LOW (ref 4.22–5.81)
RDW: 14.9 % (ref 11.5–15.5)
WBC: 11.9 10*3/uL — ABNORMAL HIGH (ref 4.0–10.5)

## 2015-01-11 LAB — MRSA PCR SCREENING: MRSA by PCR: NEGATIVE

## 2015-01-11 SURGERY — FRAMELESS STEREOTACTIC BIOPSY
Anesthesia: General | Site: Head

## 2015-01-11 MED ORDER — EPHEDRINE SULFATE 50 MG/ML IJ SOLN
INTRAMUSCULAR | Status: AC
  Filled 2015-01-11: qty 1

## 2015-01-11 MED ORDER — THROMBIN 5000 UNITS EX SOLR
CUTANEOUS | Status: DC | PRN
  Administered 2015-01-11 (×2): 5000 [IU] via TOPICAL

## 2015-01-11 MED ORDER — 0.9 % SODIUM CHLORIDE (POUR BTL) OPTIME
TOPICAL | Status: DC | PRN
  Administered 2015-01-11: 1000 mL

## 2015-01-11 MED ORDER — ONDANSETRON HCL 4 MG/2ML IJ SOLN: 4.0000 mg | INTRAMUSCULAR | Status: DC | PRN

## 2015-01-11 MED ORDER — SUCCINYLCHOLINE CHLORIDE 20 MG/ML IJ SOLN
INTRAMUSCULAR | Status: AC
  Filled 2015-01-11: qty 1

## 2015-01-11 MED ORDER — HEMOSTATIC AGENTS (NO CHARGE) OPTIME
TOPICAL | Status: DC | PRN
  Administered 2015-01-11: 1 via TOPICAL

## 2015-01-11 MED ORDER — EPHEDRINE SULFATE 50 MG/ML IJ SOLN
INTRAMUSCULAR | Status: DC | PRN
  Administered 2015-01-11: 10 mg via INTRAVENOUS

## 2015-01-11 MED ORDER — ONDANSETRON HCL 4 MG PO TABS: 4.0000 mg | ORAL_TABLET | ORAL | Status: DC | PRN

## 2015-01-11 MED ORDER — ACETAMINOPHEN 650 MG RE SUPP
650.0000 mg | RECTAL | Status: DC | PRN
  Administered 2015-01-11 – 2015-01-12 (×2): 650 mg via RECTAL
  Filled 2015-01-11 (×2): qty 1

## 2015-01-11 MED ORDER — SUGAMMADEX SODIUM 200 MG/2ML IV SOLN
INTRAVENOUS | Status: AC
  Filled 2015-01-11: qty 2

## 2015-01-11 MED ORDER — MORPHINE SULFATE (PF) 2 MG/ML IV SOLN
1.0000 mg | INTRAVENOUS | Status: DC | PRN
  Administered 2015-01-14 – 2015-01-19 (×4): 2 mg via INTRAVENOUS
  Filled 2015-01-11 (×4): qty 1

## 2015-01-11 MED ORDER — LABETALOL HCL 5 MG/ML IV SOLN
10.0000 mg | INTRAVENOUS | Status: DC | PRN
  Filled 2015-01-11: qty 8

## 2015-01-11 MED ORDER — LIDOCAINE HCL (CARDIAC) 20 MG/ML IV SOLN
INTRAVENOUS | Status: AC
  Filled 2015-01-11: qty 5

## 2015-01-11 MED ORDER — CEFAZOLIN SODIUM-DEXTROSE 2-3 GM-% IV SOLR
2.0000 g | Freq: Three times a day (TID) | INTRAVENOUS | Status: AC
  Administered 2015-01-11 – 2015-01-12 (×2): 2 g via INTRAVENOUS
  Filled 2015-01-11 (×2): qty 50

## 2015-01-11 MED ORDER — SUGAMMADEX SODIUM 200 MG/2ML IV SOLN
INTRAVENOUS | Status: DC | PRN
  Administered 2015-01-11: 200 mg via INTRAVENOUS

## 2015-01-11 MED ORDER — MIDAZOLAM HCL 2 MG/2ML IJ SOLN
INTRAMUSCULAR | Status: AC
  Filled 2015-01-11: qty 2

## 2015-01-11 MED ORDER — PROMETHAZINE HCL 25 MG PO TABS: 12.5000 mg | ORAL_TABLET | ORAL | Status: DC | PRN

## 2015-01-11 MED ORDER — PROPOFOL 10 MG/ML IV BOLUS
INTRAVENOUS | Status: DC | PRN
  Administered 2015-01-11: 100 mg via INTRAVENOUS
  Administered 2015-01-11: 50 mg via INTRAVENOUS

## 2015-01-11 MED ORDER — FENTANYL CITRATE (PF) 250 MCG/5ML IJ SOLN
INTRAMUSCULAR | Status: AC
  Filled 2015-01-11: qty 5

## 2015-01-11 MED ORDER — PROPOFOL 10 MG/ML IV BOLUS
INTRAVENOUS | Status: AC
  Filled 2015-01-11: qty 20

## 2015-01-11 MED ORDER — LIDOCAINE HCL (CARDIAC) 20 MG/ML IV SOLN
INTRAVENOUS | Status: DC | PRN
  Administered 2015-01-11: 40 mg via INTRAVENOUS

## 2015-01-11 MED ORDER — SODIUM CHLORIDE 0.9 % IR SOLN
Status: DC | PRN
  Administered 2015-01-11: 10:00:00

## 2015-01-11 MED ORDER — BUPIVACAINE-EPINEPHRINE 0.5% -1:200000 IJ SOLN
INTRAMUSCULAR | Status: DC | PRN
  Administered 2015-01-11: 5 mL

## 2015-01-11 MED ORDER — FENTANYL CITRATE (PF) 100 MCG/2ML IJ SOLN
INTRAMUSCULAR | Status: DC | PRN
  Administered 2015-01-11: 50 ug via INTRAVENOUS

## 2015-01-11 MED ORDER — PANTOPRAZOLE SODIUM 40 MG IV SOLR
40.0000 mg | Freq: Every day | INTRAVENOUS | Status: DC
  Administered 2015-01-11 – 2015-01-16 (×6): 40 mg via INTRAVENOUS
  Filled 2015-01-11 (×6): qty 40

## 2015-01-11 MED ORDER — MEPERIDINE HCL 25 MG/ML IJ SOLN: 6.2500 mg | INTRAMUSCULAR | Status: DC | PRN

## 2015-01-11 MED ORDER — FENTANYL CITRATE (PF) 100 MCG/2ML IJ SOLN: 25.0000 ug | INTRAMUSCULAR | Status: DC | PRN

## 2015-01-11 MED ORDER — ROCURONIUM BROMIDE 50 MG/5ML IV SOLN
INTRAVENOUS | Status: AC
  Filled 2015-01-11: qty 1

## 2015-01-11 MED ORDER — SODIUM CHLORIDE 0.9 % IV BOLUS (SEPSIS)
500.0000 mL | Freq: Once | INTRAVENOUS | Status: AC
  Administered 2015-01-11: 500 mL via INTRAVENOUS

## 2015-01-11 MED ORDER — PHENYLEPHRINE HCL 10 MG/ML IJ SOLN
INTRAMUSCULAR | Status: DC | PRN
  Administered 2015-01-11 (×4): 80 ug via INTRAVENOUS

## 2015-01-11 MED ORDER — ACETAMINOPHEN 325 MG PO TABS
650.0000 mg | ORAL_TABLET | ORAL | Status: DC | PRN
  Administered 2015-01-12: 650 mg via ORAL
  Filled 2015-01-11: qty 2

## 2015-01-11 MED ORDER — MIDAZOLAM HCL 2 MG/2ML IJ SOLN: 0.5000 mg | Freq: Once | INTRAMUSCULAR | Status: DC | PRN

## 2015-01-11 MED ORDER — LIDOCAINE-EPINEPHRINE 1 %-1:100000 IJ SOLN
INTRAMUSCULAR | Status: DC | PRN
  Administered 2015-01-11: 5 mL

## 2015-01-11 MED ORDER — PROMETHAZINE HCL 25 MG/ML IJ SOLN: 6.2500 mg | INTRAMUSCULAR | Status: DC | PRN

## 2015-01-11 MED ORDER — BACITRACIN ZINC 500 UNIT/GM EX OINT
TOPICAL_OINTMENT | CUTANEOUS | Status: DC | PRN
  Administered 2015-01-11: 1 via TOPICAL

## 2015-01-11 MED ORDER — ONDANSETRON HCL 4 MG/2ML IJ SOLN: 4.0000 mg | Freq: Once | INTRAMUSCULAR | Status: DC | PRN

## 2015-01-11 MED ORDER — STERILE WATER FOR INJECTION IJ SOLN
INTRAMUSCULAR | Status: AC
  Filled 2015-01-11: qty 10

## 2015-01-11 MED ORDER — ROCURONIUM BROMIDE 100 MG/10ML IV SOLN
INTRAVENOUS | Status: DC | PRN
  Administered 2015-01-11: 25 mg via INTRAVENOUS
  Administered 2015-01-11: 50 mg via INTRAVENOUS
  Administered 2015-01-11: 10 mg via INTRAVENOUS

## 2015-01-11 MED ORDER — CEFAZOLIN SODIUM-DEXTROSE 2-3 GM-% IV SOLR
INTRAVENOUS | Status: DC | PRN
  Administered 2015-01-11: 2 g via INTRAVENOUS

## 2015-01-11 MED ORDER — HYDROMORPHONE HCL 1 MG/ML IJ SOLN: 0.2500 mg | INTRAMUSCULAR | Status: DC | PRN

## 2015-01-11 SURGICAL SUPPLY — 49 items
BANDAGE ADH SHEER 1  50/CT (GAUZE/BANDAGES/DRESSINGS) IMPLANT
BLADE CLIPPER SURG (BLADE) IMPLANT
BLADE SURG 10 STRL SS (BLADE) ×4 IMPLANT
BLADE SURG 15 STRL LF DISP TIS (BLADE) ×2 IMPLANT
BLADE SURG 15 STRL SS (BLADE) ×2
BUR ACORN 6.0 PRECISION (BURR) ×3 IMPLANT
BUR ACORN 6.0MM PRECISION (BURR) ×1
CANISTER SUCT 3000ML PPV (MISCELLANEOUS) ×4 IMPLANT
CORDS BIPOLAR (ELECTRODE) ×4 IMPLANT
COVER MAYO STAND STRL (DRAPES) ×4 IMPLANT
DRAPE INCISE IOBAN 66X45 STRL (DRAPES) ×4 IMPLANT
DRAPE POUCH INSTRU U-SHP 10X18 (DRAPES) ×4 IMPLANT
DURAPREP 26ML APPLICATOR (WOUND CARE) IMPLANT
ELECT CAUTERY BLADE 6.4 (BLADE) ×4 IMPLANT
ELECT REM PT RETURN 9FT ADLT (ELECTROSURGICAL) ×4
ELECTRODE REM PT RTRN 9FT ADLT (ELECTROSURGICAL) ×2 IMPLANT
GAUZE SPONGE 4X4 12PLY STRL (GAUZE/BANDAGES/DRESSINGS) ×4 IMPLANT
GAUZE SPONGE 4X4 16PLY XRAY LF (GAUZE/BANDAGES/DRESSINGS) ×4 IMPLANT
GLOVE BIO SURGEON STRL SZ8 (GLOVE) ×4 IMPLANT
GLOVE BIO SURGEON STRL SZ8.5 (GLOVE) ×4 IMPLANT
GLOVE EXAM NITRILE LRG STRL (GLOVE) IMPLANT
GLOVE EXAM NITRILE MD LF STRL (GLOVE) IMPLANT
GLOVE EXAM NITRILE XL STR (GLOVE) IMPLANT
GLOVE EXAM NITRILE XS STR PU (GLOVE) IMPLANT
GOWN STRL REUS W/ TWL LRG LVL3 (GOWN DISPOSABLE) IMPLANT
GOWN STRL REUS W/ TWL XL LVL3 (GOWN DISPOSABLE) IMPLANT
GOWN STRL REUS W/TWL LRG LVL3 (GOWN DISPOSABLE)
GOWN STRL REUS W/TWL XL LVL3 (GOWN DISPOSABLE)
KIT BASIN OR (CUSTOM PROCEDURE TRAY) ×4 IMPLANT
KIT ROOM TURNOVER OR (KITS) ×4 IMPLANT
MARKER SPHERE PSV REFLC 13MM (MARKER) ×12 IMPLANT
NEEDLE HYPO 25X1 1.5 SAFETY (NEEDLE) ×4 IMPLANT
NS IRRIG 1000ML POUR BTL (IV SOLUTION) ×4 IMPLANT
PACK EENT II TURBAN DRAPE (CUSTOM PROCEDURE TRAY) ×4 IMPLANT
PAD ARMBOARD 7.5X6 YLW CONV (MISCELLANEOUS) ×12 IMPLANT
PATTIES SURGICAL .5 X.5 (GAUZE/BANDAGES/DRESSINGS) ×4 IMPLANT
PENCIL BUTTON HOLSTER BLD 10FT (ELECTRODE) ×4 IMPLANT
STAPLER SKIN PROX WIDE 3.9 (STAPLE) ×4 IMPLANT
SUT BONE WAX W31G (SUTURE) ×4 IMPLANT
SUT VIC AB 2-0 CP2 18 (SUTURE) ×4 IMPLANT
SYR 5ML LUER SLIP (SYRINGE) ×8 IMPLANT
SYR BULB 3OZ (MISCELLANEOUS) ×4 IMPLANT
SYR CONTROL 10ML LL (SYRINGE) ×4 IMPLANT
TAPE CLOTH SURG 4X10 WHT LF (GAUZE/BANDAGES/DRESSINGS) ×4 IMPLANT
TOWEL OR 17X24 6PK STRL BLUE (TOWEL DISPOSABLE) ×4 IMPLANT
TOWEL OR 17X26 10 PK STRL BLUE (TOWEL DISPOSABLE) ×4 IMPLANT
TUBE CONNECTING 12'X1/4 (SUCTIONS) ×1
TUBE CONNECTING 12X1/4 (SUCTIONS) ×3 IMPLANT
WATER STERILE IRR 1000ML POUR (IV SOLUTION) ×4 IMPLANT

## 2015-01-11 NOTE — Anesthesia Procedure Notes (Signed)
Procedure Name: Intubation Date/Time: 01/11/2015 9:34 AM Performed by: Ollen Bowl Pre-anesthesia Checklist: Patient identified, Emergency Drugs available, Suction available, Patient being monitored and Timeout performed Patient Re-evaluated:Patient Re-evaluated prior to inductionOxygen Delivery Method: Circle system utilized and Simple face mask Preoxygenation: Pre-oxygenation with 100% oxygen Intubation Type: Combination inhalational/ intravenous induction Ventilation: Mask ventilation without difficulty Laryngoscope Size: Miller and 3 Grade View: Grade I Tube type: Oral Tube size: 7.5 mm Number of attempts: 1 Airway Equipment and Method: Patient positioned with wedge pillow and Stylet Placement Confirmation: ETT inserted through vocal cords under direct vision,  positive ETCO2 and breath sounds checked- equal and bilateral Secured at: 23 cm Tube secured with: Tape Dental Injury: Teeth and Oropharynx as per pre-operative assessment

## 2015-01-11 NOTE — Progress Notes (Signed)
PT Cancellation Note  Patient Details Name: Aaron White MRN: [id number] DOB: 06-13-1940   Cancelled Treatment:    Reason Eval/Treat Not Completed: Patient at procedure or test/unavailable (brain biopsy). Will follow up tomorrow.   Pressley Barsky 01/11/2015, 10:16 AM Suanne Marker PT [phone]

## 2015-01-11 NOTE — Op Note (Signed)
Brief history: The patient is a 75 year old white male who presented with mental status changes/confusion. He was worked up with a brain MRI which demonstrated a posterior corpus callosum mass. I discussed the situation with the patient and subsequently his wife. We discussed the various treatment options including a stereotactic brain biopsy. I described the procedure, the risks, the benefits, the alternatives, etc. I have answered all the questions. The patient and his wife had decided to proceed with the biopsy.     preop diagnosis: brain tumor   Postop diagnosis: The same   Procedure : stereotactic brain biopsy with BrainLab frameless neuronavigation   Surgeon: Dr. Earle Gell     assistant: none   Anesthesia: Gen. Tracheal    estimated blood loss: Minimal    specimens: tumor biopsies   complications: None    description of procedure: The patient was brought to the operating room by the anesthesia team. General endotracheal anesthesia was induced.  I applied the [place] 3. point Headrest and the patient's calvarium.The patient was turned to the lateral position with his right side up.  The patient's brain MRI was previously loaded into the [employer] computer. We  registered the points into the [place] computer. The patient's right  Parietal-occipital scalp was shaved with clippers and prepared with DuraPrep. Sterile drapes were applied. I used a probe to determine where to make the burr hole.  I injected the area to be incised with Marcaine with epinephrine solution. I used a scalpel to make an incision in the patient's scalp over the area  The burr hole was to be placed. I used the Geologist, engineering for exposure. I used a high-speed drill to create a right proximal occipital burr hole. I exposed the dura with a drill and then widen the burr hole with a Kerrison punches. We then assembled and adjusted the arc of the BrainLab neuro navigation system.  I inserted the brain needle into the  tumor under computer guidance. We then obtained 2 specimens without notable complication. The specimens were sent down to pathology. I then removed the arc. We reapproximated the patient's  [place] with interrupted 2-0 Vicryl suture. I reapproximated the skin with stainless steel staples. We then applied a sterile dressing. I removed the drapes. I then removed in May. 3 point Headrest from the patient's calvarium. He was then returned to the supine position. By report all sponge, instrument, and needle counts were correct at the end this case.

## 2015-01-11 NOTE — Anesthesia Postprocedure Evaluation (Signed)
Anesthesia Post Note  Patient: Aaron White  Procedure(s) Performed: Procedure(s) (LRB): STERIOTACTIC BIOPSY OF BRAIN WITH BRAINLAB (N/A) APPLICATION OF CRANIAL NAVIGATION (N/A)  Patient location during evaluation: PACU Anesthesia Type: General Level of consciousness: lethargic, confused and responds to stimulation (as was pre-op) Pain management: pain level controlled Vital Signs Assessment: post-procedure vital signs reviewed and stable Respiratory status: spontaneous breathing, nonlabored ventilation, respiratory function stable and patient connected to nasal cannula oxygen Cardiovascular status: blood pressure returned to baseline and stable Postop Assessment: no signs of nausea or vomiting Anesthetic complications: no    Last Vitals:  Filed Vitals:   01/11/15 1400 01/11/15 1415  BP: 126/70 127/63  Pulse: 77 87  Temp:    Resp: 20 23    Last Pain:  Filed Vitals:   01/11/15 1421  PainSc: 0-No pain                 Aurielle Slingerland,E. Jamayah Myszka

## 2015-01-11 NOTE — Progress Notes (Signed)
Unable to collect occult stool, patient w no bowel movement this shift.

## 2015-01-11 NOTE — Transfer of Care (Signed)
Immediate Anesthesia Transfer of Care Note  Patient: Aaron White  Procedure(s) Performed: Procedure(s): STERIOTACTIC BIOPSY OF BRAIN WITH BRAINLAB (N/A) APPLICATION OF CRANIAL NAVIGATION (N/A)  Patient Location: PACU  Anesthesia Type:General  Level of Consciousness: awake and alert   Airway & Oxygen Therapy: Patient Spontanous Breathing and Patient connected to face mask oxygen  Post-op Assessment: Report given to RN, Post -op Vital signs reviewed and stable and Patient moving all extremities X 4  Post vital signs: Reviewed and stable  Last Vitals:  Filed Vitals:   01/11/15 0554 01/11/15 1112  BP: 146/69   Pulse: 78   Temp: 37.6 C 37.1 C  Resp: 18     Complications: No apparent anesthesia complications

## 2015-01-11 NOTE — Progress Notes (Signed)
Patient ID: Aaron White, male   DOB: 24-Feb-1940, 75 y.o.   MRN: [id number] Subjective:   The patient is somnolent but easily arousable. He is in no apparent distress. He is without change from his preoperative status.  Objective: Vital signs in last 24 hours: Temp:  [98.3 F (36.8 C)-100.2 F (37.9 C)] 98.7 F (37.1 C) (01/04 1112) Pulse Rate:  [70-91] 91 (01/04 1113) Resp:  [15-32] 32 (01/04 1113) BP: (140-153)/(64-77) 140/77 mmHg (01/04 1113) SpO2:  [96 %-100 %] 96 % (01/04 1113)  Intake/Output from previous day: 01/03 0701 - 01/04 0700 In: 250 [P.O.:240; I.V.:10] Out: 255 [Urine:255] Intake/Output this shift: Total I/O In: 1400 [I.V.:1400] Out: -   Physical exam  The patient is somnolent but arousable. He follows commands and moves all 4 extremities well. He will answer questions.  Lab Results:  Recent Labs  01/10/15 1006 01/11/15 0543  WBC 9.8 11.9*  HGB 8.4* 9.5*  HCT 26.2* 29.6*  PLT 277 232   BMET  Recent Labs  01/10/15 0050 01/11/15 0543  NA 142 143  K 3.7 4.0  CL 109 109  CO2 25 25  GLUCOSE 217* 161*  BUN 13 7  CREATININE 0.88 0.84  CALCIUM 8.8* 8.9    Studies/Results: Mr Kizzie Fantasia Contrast  01/10/2015  ADDENDUM REPORT: 01/10/2015 20:19 ADDENDUM: After further investigation, patient did receive iron supplement (ferriheme) 01/07/2015 and 01/08/2015 which is felt to be responsible for the altered signal intensity of the brain mass. Therefore hemorrhage into the mass is felt unlikely as cause for the interval change. This was discussed with the patient's nurse Claiborne Billings. Electronically Signed   By: Genia Del M.D.   On: 01/10/2015 20:19  01/10/2015  CLINICAL DATA:  75 year old hypertensive diabetic male with increasing confusion. Subsequent encounter. EXAM: MRI HEAD WITHOUT AND WITH CONTRAST TECHNIQUE: Multiplanar, multiecho pulse sequences of the brain and surrounding structures were obtained without and with intravenous contrast. CONTRAST:  68mL  MULTIHANCE GADOBENATE DIMEGLUMINE 529 MG/ML IV SOLN COMPARISON:  01/05/2015 brain MR. 12/31/2014 head CT. FINDINGS: Bulky irregular mass centered in the splenium of corpus callosum spans over 6.8 x 3.2 x 8 2.8 cm with extension into the posterior medial temporal lobes larger on the right. Marked change in appearance of this lesion on the pre contrast imaging (confirmed with technologist contrast was not given prior to sagittal T1 imaging). The appearance suggests that the patient has bled into the tumor in the interim. Alternatively, the abnormal T1 imaging may be related to recent medication administration, but does not appear to be case after talking with the patient's nurse. Patient received iodinated contrast for inferior vena cava filter placement today however this should not cause this appearance. This could be further assessed on follow-up T1 weighted imaging to determine if this represents artifact or true hemorrhage. The lesion is highly suspicious for a glioblastoma. Lymphoma is a secondary less likely consideration. Demyelinating process felt to be a secondary much less likely consideration. There may be a tiny daughter lesion within the left temporal lobe. No acute infarct. Mild chronic small vessel disease type changes. Global atrophy without hydrocephalus. Major intracranial vascular structures are patent. IMPRESSION: Bulky lesion within the splenium corpus callosum with extension to the posterior medial type lobes is highly suspicious for glioblastoma with other considerations felt less likely as noted above. When compared to the prior examination, there has been a change in the signal characteristics of this lesion which may be related to interval hemorrhage into lesion (assuming that  there is not a confounding factor such as administration of medication which causes T1 hyperintensity and gradient abnormality). This could be further assessed on follow-up T1 weighted imaging to determine if this  represents artifact or true hemorrhage. There may be a tiny daughter lesion within the left temporal lobe. These results were called by telephone at the time of interpretation on 01/10/2015 at 7:29 pm to [facility] patients nurse who verbally acknowledged these results. Electronically Signed: By: Genia Del M.D. On: 01/10/2015 19:32   Ir Ivc Filter Plmt / S&i /img Guid/mod Sed  01/10/2015  INDICATION: History of pulmonary embolism and lower extremity DVT. Patient with recent MR findings concerning for either GBM or CNS lymphoma. Request made for placement of an IVC filter for (at least temporary) caval interruption. EXAM: ULTRASOUND GUIDANCE FOR VASCULAR ACCESS IVC CATHETERIZATION AND VENOGRAM IVC FILTER INSERTION COMPARISON:  CT the chest, abdomen pelvis - 01/06/2015; brain MRI -01/05/2015 MEDICATIONS: Fentanyl 50 mcg IV ANESTHESIA/SEDATION: Sedation Time 15 minutes CONTRAST:  60 cc Omnipaque 300 FLUOROSCOPY TIME:  48 minutes (81 mGy) COMPLICATIONS: None immediate PROCEDURE: Informed consent was obtained from the patient following explanation of the procedure, risks, benefits and alternatives. The patient understands, agrees and consents for the procedure. All questions were addressed. A time out was performed prior to the initiation of the procedure. Maximal barrier sterile technique utilized including caps, mask, sterile gowns, sterile gloves, large sterile drape, hand hygiene, and Betadine prep. Under sterile condition and local anesthesia, right internal jugular venous access was performed with ultrasound. An ultrasound image was saved and sent to PACS. Over a guidewire, the IVC filter delivery sheath and inner dilator were advanced into the IVC just above the IVC bifurcation. Contrast injection was performed for an IVC venogram. Through the delivery sheath, a retrievable Denali IVC filter was deployed below the level of the renal veins and above the IVC bifurcation. Limited post deployment venacavagram was  performed. The delivery sheath was removed and hemostasis was obtained with manual compression. A dressing was placed. The patient tolerated the procedure well without immediate post procedural complication. FINDINGS: The IVC is patent. No evidence of thrombus, stenosis, or occlusion. No variant venous anatomy. Successful placement of the IVC filter below the level of the renal veins. IMPRESSION: Successful ultrasound and fluoroscopically guided placement of an infrarenal retrievable IVC filter via right jugular approach. This IVC filter is potentially retrievable. The patient will be assessed for filter retrieval by Interventional Radiology in approximately 8-12 weeks. Further recommendations regarding filter retrieval, continued surveillance or declaration of device permanence, will be made at that time. Electronically Signed   By: Sandi Mariscal M.D.   On: 01/10/2015 14:18    Assessment/Plan:  brain tumor: The patient is doing well status post his brain biopsy.   LOS: 7 days     Sinthia Karabin D 01/11/2015, 11:37 AM

## 2015-01-11 NOTE — Progress Notes (Signed)
Post void residual <500 x2 this shift, pt with increased frequency, will continue to monitor.

## 2015-01-11 NOTE — Progress Notes (Signed)
Patient ID: Aaron White, male   DOB: Feb 05, 1940, 75 y.o.   MRN: [id number] [facility] PROGRESS NOTE  Aaron White [id number] DOB: 1940-06-01 DOA: 01/04/2015 PCP: Horatio Pel, MD  Brief narrative:    75 y.o. male with past medical history significant for hypertension, diabetes, obstructive sleep apnea on CPAP, recently hospitalized for GI bleed who now presented to [facility] because of worsening confusion over past few days prior to this admission. Per patient's family, ever since discharge 12/26/2014 patient was not really himself. Patient would say things such as him being afraid of dying and that he is losing the mammary and he wanted to see his son in [place] but was worried as he has a fear of flying.   Patient was hemodynamically stable on the admission. Blood work was significant for hemoglobin of 8.1, otherwise unremarkable. He was admitted for further evaluation of acute encephalopathy. 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 Neurosurgery, radiation oncology and oncology consulted.    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  - Neurosurgery, radiation oncology and oncology consulted. Per oncology, pt needs biopsy before any treatment plan can be instituted.  - Dr. Arnoldo Morale of neurosurgery  To do biopsy on 1/4, keep npo after midnight on 1/3 Dr Arnoldo Morale advised to transfer patient to [place] on 1/3, per Dr. Arnoldo Morale biopsy will be in the afternoon on 1/4, after biopsy patient will be observed at [facility] at least  one night.  On 01/10/2014 he remains encephalopathic.   PE/DVT:  -CT scan of lungs with IV contrast performed on 01/06/2015 showing small pulmonary embolus noted within a segmental branch of the left lower lobe. Bilateral lower extremity Dopplers performed on 01/08/2015 showing acute DVT involving left posterior tibial vein. -Started on heparin drip on admission, Heparin drip stopped on 1/3 due to new onset of gross hematuria,  ivc filter placed by IR on 1/3 at [place]   Gross hematuria/3.1 cm bladder vs prostate mass:  Heparin drip stopped, ua/urine culture ordered on 1/3, consider abx if hematuria persists or fever, urology consulted -CT findings of prostate likely a benign etiology. Urology recommending cystoscopy in the outpatient setting. No further interventions during this hospitalization recommended  Anemia of chronic disease and iron deficiency anemia and blood loss anemia for hematuria, dose has h/o gi bleed from hemorrhoids/diverticula, stool occult blood this admission negative. - Recent GI bleed and colonoscopy 12/28/2014 with finding of hemorrhoids -- Aspirin placed on hold, Continue protonix daily  - Transfuse 1 unit PRBC on 12/30, 1uint on 1/3 -s/p iv iron , stool occult blood daily -transfuse prbc to keep hgb>7. -On 01/11/2015 labs show a hemoglobin of 9.5.  Frequent urination and prostate pain Enlarged prostate on ct ab: ua unremarkable, psa 4.3 on 12/29 and 2.69 on 1/1 , started flomax, urology recommending outpatient cystoscopy when stable  Essential hypertension - bp stable so far, hold HCTZ due to poor oral intake, continue avapro -on gentle hydration due to poor oral intake  Controlled diabetes mellitus type 2 without complications without long-term insulin use - Continue SSI, held metformin - Our pharmacy does not carry alogliptin-Pioglitazone  Depression/anxiety - Continue  risperidone 0.5 mg at bedtime, xanax q8hrs prn and qhs prn - Psych consulted  01/07/2015 , recommended trial of zoloft  DVT Prophylaxis  - SCD's , was on heparin drip, now s/p ivf filter  Code Status: Full.  Family Communication:  plan of care discussed with the patient and wife in room Disposition Plan: Plan for brain biopsy to be done on 01/10/2005  IV access:  Peripheral IV  Procedures and diagnostic studies:    US Scrotum 01/04/2015  Small benign-appearing cyst in the left testicle. Otherwise unremarkable study.   [place] Art/ven Flow Abd Pelv Doppler 01/04/2015  Small benign-appearing cyst in the left testicle. Otherwise unremarkable study.   Medical Consultants:  Psychiatry Nerosurgery - Dr. Arnoldo Morale Oncology - Dr. Marin Olp Radiation oncology   Urology Dr. Louis Meckel  Other Consultants:  PT   IAnti-Infectives:   None    Kelvin Cellar, MD PhD [facility] Pager [phone]  Time spent in minutes: 35 minutes, more than 50% time spent on coordination of care  If 7PM-7AM, please contact night-coverage [email or web address] Password [facility] 01/11/2015, 8:24 AM   LOS: 7 days    HPI/Subjective: Patient is confused, disoriented  Objective: Filed Vitals:   01/10/15 2200 01/10/15 2338 01/11/15 0229 01/11/15 0554  BP: 153/71 146/74 152/64 146/69  Pulse: 74 75 84 78  Temp: 98.9 F (37.2 C) 100.2 F (37.9 C) 99.9 F (37.7 C) 99.7 F (37.6 C)  TempSrc: Oral Oral Oral Oral  Resp: 15 15 18 18   Height:      Weight:      SpO2: 98% 98% 96% 100%    Intake/Output Summary (Last 24 hours) at 01/11/15 0824 Last data filed at 01/11/15 0429  Gross per 24 hour  Intake    250 ml  Output    255 ml  Net     -5 ml    Exam:   General:  Pt is confused however able to follow simple commands  Cardiovascular: Regular rate and rhythm, S1/S2, no murmurs  Respiratory: Clear to auscultation bilaterally, no wheezing, no crackles, no rhonchi  Abdomen: Soft, non tender, non distended, bowel sounds present  Extremities: No edema, pulses DP and PT palpable  bilaterally  Neuro: Grossly nonfocal,not oriented  Data Reviewed: Basic Metabolic Panel:  Recent Labs Lab 01/05/15 0400  01/07/15 0150 01/08/15 0506 01/09/15 0508 01/10/15 0050 01/11/15 0543  NA 142  < > 138 140 143 142 143  K 3.3*  < > 4.1 3.6 3.7 3.7 4.0  CL 104  < > 103 106 110 109 109  CO2 29  < > 28 24 26 25 25   GLUCOSE 203*  < > 141* 179* 135* 217* 161*  BUN 10  < > 12 12 13 13 7   CREATININE 0.82  < > 1.07 0.80 0.92 0.88 0.84  CALCIUM 9.4  < > 8.6* 8.7* 8.8* 8.8* 8.9  MG 1.8  --   --  1.8 1.9  --   --   PHOS 2.9  --   --   --   --   --   --   < > = values in this interval not displayed. Liver Function Tests:  Recent Labs Lab 01/04/15 1418 01/05/15 0400 01/06/15 0535 01/07/15 0150  AST 17 23 17 18   ALT 23 25 20 20   ALKPHOS 49 63 48 46  BILITOT 0.6 1.2 0.9 1.1  PROT 6.2* 7.6 5.7* 6.1*  ALBUMIN 3.6 4.3 3.1* 3.4*   No results for input(s): LIPASE, AMYLASE in the  last 168 hours. No results for input(s): AMMONIA in the last 168 hours. CBC:  Recent Labs Lab 01/05/15 0400 01/06/15 0535 01/07/15 0150 01/08/15 0506 01/09/15 0508 01/10/15 0050 01/10/15 1006 01/11/15 0543  WBC 5.9 5.9 6.6 6.7 8.0 10.0 9.8 11.9*  NEUTROABS 3.7 3.6 4.8  --   --   --   --   --   HGB 9.4* 7.4* 8.8* 8.4* 7.8* 8.4* 8.4* 9.5*  HCT 29.8* 23.0* 26.6* 25.8* 24.1* 26.3* 26.2* 29.6*  MCV 88.7 88.8 87.8 86.9 88.3 91.3 89.4 89.2  PLT 339 267 276 252 243 285 277 232   Cardiac Enzymes: No results for input(s): CKTOTAL, CKMB, CKMBINDEX, TROPONINI in the last 168 hours. BNP: Invalid input(s): POCBNP CBG:  Recent Labs Lab 01/10/15 0722 01/10/15 1157 01/10/15 1628 01/10/15 2204 01/11/15 0645  GLUCAP 162* 146* 180* 209* 146*    No results found for this or any previous visit (from the past 240 hour(s)).   Scheduled Meds: . Alogliptin-Pioglitazone  1 tablet Oral Daily  . aspirin EC  81 mg Oral Daily  . cholecalciferol  2,000 Units Oral Daily  . fluticasone  1 spray Each Nare  Daily  . irbesartan  300 mg Oral Daily   And  . hydrochlorothiazide  25 mg Oral Daily  . metFORMIN  1,000 mg Oral BID WC  . multivitamin with minerals  1 tablet Oral Daily  . pantoprazole  40 mg Oral Daily  . potassium chloride SA  40 mEq Oral Once  . risperiDONE  0.5 mg Oral QHS  . vitamin E  200 Units Oral Daily   Continuous Infusions: . 0.9 % NaCl with KCl 20 mEq / L 50 mL/hr at 01/09/15 0820

## 2015-01-11 NOTE — Progress Notes (Signed)
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 He may have a little bit of psychosis. He does have sleep apnea.  He is going for the biopsy today.  He did have a filter put in by interventional radiology. I very much appreciate this. He was on heparin.  He did get 1 unit of blood. I do not have a CBC back yet.  His been no obvious bleeding from the GI tract despite being on heparin.  We will await the biopsy. He has a biopsied today, we should have a preliminary report by Friday.  I probably would hold the heparin for 1- 2 days after the biopsy. This is just to make sure that there is no bleeding into the biopsy site.  He will need some form of long-term anticoagulation. We will have to decide what might be appropriate for him.  I very much appreciate Dr. Arnoldo Morale helping out. Hopefully, the biopsy will go without issue today.  Harriette Ohara 29:11

## 2015-01-11 NOTE — Progress Notes (Signed)
Patient ID: Aaron White, male   DOB: [birth year], 75 y.o.   MRN: [id number] Subjective:  The patient is somnolent but arousable. He is more somnolent and confused since I last saw him.  Objective: Vital signs in last 24 hours: Temp:  [98.3 F (36.8 C)-100.2 F (37.9 C)] 99.7 F (37.6 C) (01/04 0554) Pulse Rate:  [70-84] 78 (01/04 0554) Resp:  [15-18] 18 (01/04 0554) BP: (142-153)/(64-74) 146/69 mmHg (01/04 0554) SpO2:  [96 %-100 %] 100 % (01/04 0554)  Intake/Output from previous day: 01/03 0701 - 01/04 0700 In: 250 [P.O.:240; I.V.:10] Out: 255 [Urine:255] Intake/Output this shift: Total I/O In: 1400 [I.V.:1400] Out: -   Physical exam the patient is somnolent but arousable, Glasgow Coma Scale 12, [id number]. He is oriented to person and a hospital. He moves all 4 extremities well.  Lab Results:  Recent Labs  01/10/15 1006 01/11/15 0543  WBC 9.8 11.9*  HGB 8.4* 9.5*  HCT 26.2* 29.6*  PLT 277 232   BMET  Recent Labs  01/10/15 0050 01/11/15 0543  NA 142 143  K 3.7 4.0  CL 109 109  CO2 25 25  GLUCOSE 217* 161*  BUN 13 7  CREATININE 0.88 0.84  CALCIUM 8.8* 8.9    Studies/Results: Mr Kizzie Fantasia Contrast  01/10/2015  ADDENDUM REPORT: 01/10/2015 20:19 ADDENDUM: After further investigation, patient did receive iron supplement (ferriheme) 01/07/2015 and 01/08/2015 which is felt to be responsible for the altered signal intensity of the brain mass. Therefore hemorrhage into the mass is felt unlikely as cause for the interval change. This was discussed with the patient's nurse Claiborne Billings. Electronically Signed   By: Genia Del M.D.   On: 01/10/2015 20:19  01/10/2015  CLINICAL DATA:  75 year old hypertensive diabetic male with increasing confusion. Subsequent encounter. EXAM: MRI HEAD WITHOUT AND WITH CONTRAST TECHNIQUE: Multiplanar, multiecho pulse sequences of the brain and surrounding structures were obtained without and with intravenous contrast. CONTRAST:  50mL MULTIHANCE  GADOBENATE DIMEGLUMINE 529 MG/ML IV SOLN COMPARISON:  01/05/2015 brain MR. 12/31/2014 head CT. FINDINGS: Bulky irregular mass centered in the splenium of corpus callosum spans over 6.8 x 3.2 x 8 2.8 cm with extension into the posterior medial temporal lobes larger on the right. Marked change in appearance of this lesion on the pre contrast imaging (confirmed with technologist contrast was not given prior to sagittal T1 imaging). The appearance suggests that the patient has bled into the tumor in the interim. Alternatively, the abnormal T1 imaging may be related to recent medication administration, but does not appear to be case after talking with the patient's nurse. Patient received iodinated contrast for inferior vena cava filter placement today however this should not cause this appearance. This could be further assessed on follow-up T1 weighted imaging to determine if this represents artifact or true hemorrhage. The lesion is highly suspicious for a glioblastoma. Lymphoma is a secondary less likely consideration. Demyelinating process felt to be a secondary much less likely consideration. There may be a tiny daughter lesion within the left temporal lobe. No acute infarct. Mild chronic small vessel disease type changes. Global atrophy without hydrocephalus. Major intracranial vascular structures are patent. IMPRESSION: Bulky lesion within the splenium corpus callosum with extension to the posterior medial type lobes is highly suspicious for glioblastoma with other considerations felt less likely as noted above. When compared to the prior examination, there has been a change in the signal characteristics of this lesion which may be related to interval hemorrhage into lesion (assuming that  there is not a confounding factor such as administration of medication which causes T1 hyperintensity and gradient abnormality). This could be further assessed on follow-up T1 weighted imaging to determine if this represents  artifact or true hemorrhage. There may be a tiny daughter lesion within the left temporal lobe. These results were called by telephone at the time of interpretation on 01/10/2015 at 7:29 pm to [facility] patients nurse who verbally acknowledged these results. Electronically Signed: By: Genia Del M.D. On: 01/10/2015 19:32   Ir Ivc Filter Plmt / S&i /img Guid/mod Sed  01/10/2015  INDICATION: History of pulmonary embolism and lower extremity DVT. Patient with recent MR findings concerning for either GBM or CNS lymphoma. Request made for placement of an IVC filter for (at least temporary) caval interruption. EXAM: ULTRASOUND GUIDANCE FOR VASCULAR ACCESS IVC CATHETERIZATION AND VENOGRAM IVC FILTER INSERTION COMPARISON:  CT the chest, abdomen pelvis - 01/06/2015; brain MRI -01/05/2015 MEDICATIONS: Fentanyl 50 mcg IV ANESTHESIA/SEDATION: Sedation Time 15 minutes CONTRAST:  60 cc Omnipaque 300 FLUOROSCOPY TIME:  48 minutes (81 mGy) COMPLICATIONS: None immediate PROCEDURE: Informed consent was obtained from the patient following explanation of the procedure, risks, benefits and alternatives. The patient understands, agrees and consents for the procedure. All questions were addressed. A time out was performed prior to the initiation of the procedure. Maximal barrier sterile technique utilized including caps, mask, sterile gowns, sterile gloves, large sterile drape, hand hygiene, and Betadine prep. Under sterile condition and local anesthesia, right internal jugular venous access was performed with ultrasound. An ultrasound image was saved and sent to PACS. Over a guidewire, the IVC filter delivery sheath and inner dilator were advanced into the IVC just above the IVC bifurcation. Contrast injection was performed for an IVC venogram. Through the delivery sheath, a retrievable Denali IVC filter was deployed below the level of the renal veins and above the IVC bifurcation. Limited post deployment venacavagram was performed. The  delivery sheath was removed and hemostasis was obtained with manual compression. A dressing was placed. The patient tolerated the procedure well without immediate post procedural complication. FINDINGS: The IVC is patent. No evidence of thrombus, stenosis, or occlusion. No variant venous anatomy. Successful placement of the IVC filter below the level of the renal veins. IMPRESSION: Successful ultrasound and fluoroscopically guided placement of an infrarenal retrievable IVC filter via right jugular approach. This IVC filter is potentially retrievable. The patient will be assessed for filter retrieval by Interventional Radiology in approximately 8-12 weeks. Further recommendations regarding filter retrieval, continued surveillance or declaration of device permanence, will be made at that time. Electronically Signed   By: Sandi Mariscal M.D.   On: 01/10/2015 14:18    Assessment/Plan: Brain tumor: I have discussed the situation with the patient's wife in person. We have discussed the various treatment options including doing nothing, empiric treatment, stereotactic brain biopsy, etc. I have described the seizure of a stereotactic brain biopsy. We have discussed the risks including the risks of anesthesia, hemorrhage, stroke, seizures, failure to make a diagnosis, etc. I have answered all her questions. She has consented on behalf of the patient for right stereotactic brain biopsy (the patient says he is left-handed but statistically these more likely to be right brain dominant.).  LOS: 7 days     Blakely Gluth D 01/11/2015, 11:05 AM

## 2015-01-11 NOTE — Anesthesia Preprocedure Evaluation (Addendum)
Anesthesia Evaluation  Patient identified by MRN, date of birth, ID band Patient confused    Reviewed: Allergy & Precautions, NPO status , Patient's Chart, lab work & pertinent test results  History of Anesthesia Complications Negative for: history of anesthetic complications  Airway Mallampati: II  TM Distance: <3 FB Neck ROM: Full    Dental  (+) Teeth Intact, Dental Advisory Given   Pulmonary sleep apnea and Continuous Positive Airway Pressure Ventilation , PE   breath sounds clear to auscultation       Cardiovascular hypertension, Pt. on medications (-) angina+ DVT   Rhythm:Regular Rate:Normal     Neuro/Psych Anxiety Depression Brain tumor: GBM    GI/Hepatic Neg liver ROS, GERD  Medicated and Controlled,Recent GI bleed: transfused   Endo/Other  diabetes (glu 146), Oral Hypoglycemic Agents  Renal/GU negative Renal ROS     Musculoskeletal   Abdominal (+) + obese,   Peds  Hematology  (+) Blood dyscrasia (Hb 9.5), ,   Anesthesia Other Findings   Reproductive/Obstetrics                          Anesthesia Physical Anesthesia Plan  ASA: IV  Anesthesia Plan: General   Post-op Pain Management:    Induction: Intravenous  Airway Management Planned: Oral ETT  Additional Equipment:   Intra-op Plan:   Post-operative Plan: Extubation in OR and Possible Post-op intubation/ventilation  Informed Consent: I have reviewed the patients History and Physical, chart, labs and discussed the procedure including the risks, benefits and alternatives for the proposed anesthesia with the patient or authorized representative who has indicated his/her understanding and acceptance.   Dental advisory given  Plan Discussed with: CRNA, Anesthesiologist and Surgeon  Anesthesia Plan Comments: (Plan routine monitors, GETA with possible post op ventilation)       Anesthesia Quick Evaluation

## 2015-01-12 ENCOUNTER — Inpatient Hospital Stay (HOSPITAL_COMMUNITY): Payer: 59

## 2015-01-12 ENCOUNTER — Encounter (HOSPITAL_COMMUNITY): Payer: Self-pay | Admitting: Radiology

## 2015-01-12 LAB — GLUCOSE, CAPILLARY
GLUCOSE-CAPILLARY: 148 mg/dL — AB (ref 65–99)
GLUCOSE-CAPILLARY: 154 mg/dL — AB (ref 65–99)
GLUCOSE-CAPILLARY: 137 mg/dL — AB (ref 65–99)
Glucose-Capillary: 138 mg/dL — ABNORMAL HIGH (ref 65–99)
Glucose-Capillary: 198 mg/dL — ABNORMAL HIGH (ref 65–99)

## 2015-01-12 LAB — CBC
HCT: 28.6 % — ABNORMAL LOW (ref 39.0–52.0)
Hemoglobin: 9 g/dL — ABNORMAL LOW (ref 13.0–17.0)
MCH: 28.7 pg (ref 26.0–34.0)
MCHC: 31.5 g/dL (ref 30.0–36.0)
MCV: 91.1 fL (ref 78.0–100.0)
PLATELETS: 197 10*3/uL (ref 150–400)
RBC: 3.14 MIL/uL — AB (ref 4.22–5.81)
RDW: 15.6 % — AB (ref 11.5–15.5)
WBC: 10.9 10*3/uL — AB (ref 4.0–10.5)

## 2015-01-12 MED ORDER — DEXTROSE 5 % IV SOLN
1.0000 g | INTRAVENOUS | Status: DC
  Administered 2015-01-12 – 2015-01-13 (×2): 1 g via INTRAVENOUS
  Filled 2015-01-12 (×2): qty 10

## 2015-01-12 NOTE — Progress Notes (Signed)
PT Cancellation Note  Patient Details Name: Aaron White MRN: [id number] DOB: 1940-04-17   Cancelled Treatment:    Reason Eval/Treat Not Completed: Fatigue/lethargy limiting ability to participate.  Pt perseverating on needing to grade tests and is only oriented to self at this time.  Oriented pt and attempted to elicit participation, but again begins to perseverate on grading tests and does not recall orientation questions.  Pt also c/o fatigue and declined mobility at this time.  Will continue to follow.     Catarina Hartshorn, [place] 01/12/2015, 2:26 PM

## 2015-01-12 NOTE — Progress Notes (Signed)
Patient ID: Aaron White, male   DOB: Aug 26, 1940, 75 y.o.   MRN: [id number] [facility] PROGRESS NOTE  GLENDEN [place] [id number] DOB: 1940-11-20 DOA: 01/04/2015 PCP: Horatio Pel, MD  Brief narrative:    75 y.o. male with past medical history significant for hypertension, diabetes, obstructive sleep apnea on CPAP, recently hospitalized for GI bleed who now presented to [facility] because of worsening confusion over past few days prior to this admission. Per patient's family, ever since discharge 12/26/2014 patient was not really himself. Patient would say things such as him being afraid of dying and that he is losing the mammary and he wanted to see his son in [place] but was worried as he has a fear of flying.   Patient was hemodynamically stable on the admission. Blood work was significant for hemoglobin of 8.1, otherwise unremarkable. He was admitted for further evaluation of acute encephalopathy. 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 Neurosurgery, radiation oncology and oncology consulted.    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  - Neurosurgery, radiation oncology and oncology consulted. Per oncology, pt needs biopsy before any treatment plan can be instituted.  - Dr. Arnoldo Morale of neurosurgery  To do biopsy on 1/4, keep npo after midnight on 1/3 Dr Arnoldo Morale advised to transfer patient to [place] on 1/3 -S/P stereotactic brain biopsy on 01/11/2015. He tolerated the procedure well there were no complications.  -Pending  pathology   PE/DVT:  -CT scan of lungs with IV contrast performed on 01/06/2015 showing small pulmonary embolus noted within a segmental branch of the left lower lobe. Bilateral lower extremity Dopplers performed on 01/08/2015 showing acute DVT involving left posterior tibial vein. -Started on heparin drip on admission, Heparin drip stopped on 1/3 due to new onset of gross hematuria,  IVC filter placed by IR on 1/3 at [place]  Gross hematuria/3.1 cm bladder vs prostate mass:  Heparin drip stopped, ua/urine culture ordered on 1/3, consider abx if hematuria persists or fever, urology consulted -CT findings of prostate likely a benign etiology. Urology recommending cystoscopy in the outpatient setting. No further interventions during this hospitalization recommended  Anemia of chronic disease and iron deficiency anemia and blood loss anemia for hematuria, dose has h/o gi bleed from hemorrhoids/diverticula, stool occult blood this admission negative. - Recent GI bleed and colonoscopy 12/28/2014 with finding of hemorrhoids -- Aspirin placed on hold, Continue protonix daily  - Transfuse 1 unit PRBC on 12/30, 1uint on 1/3 -s/p iv iron , stool occult blood daily -transfuse prbc to keep hgb>7. -On 01/12/2015 labs show a hemoglobin of 9.0.  Frequent urination and prostate pain Enlarged prostate on ct ab: ua unremarkable, psa 4.3 on 12/29 and 2.69 on 1/1 , started flomax, urology recommending outpatient cystoscopy when stable  Essential hypertension - bp stable so far, hold HCTZ due to poor oral intake, continue avapro -on gentle hydration due to poor oral intake  Controlled diabetes mellitus type 2 without complications without long-term insulin use - Continue SSI, held metformin - Our pharmacy does not carry alogliptin-Pioglitazone  Depression/anxiety - Continue risperidone 0.5 mg at bedtime, xanax q8hrs prn and qhs prn - Psych  consulted 01/07/2015 , recommended trial of zoloft  DVT  Prophylaxis  - SCD's , was on heparin drip, now s/p ivf filter  Code Status: Full.  Family Communication:  plan of care discussed with the patient and wife in room Disposition Plan: Plan for brain biopsy to be done on 01/10/2005  IV access:  Peripheral IV  Procedures and diagnostic studies:    US Scrotum 01/04/2015  Small benign-appearing cyst in the left testicle. Otherwise unremarkable study.   [place] Art/ven Flow Abd Pelv Doppler 01/04/2015  Small benign-appearing cyst in the left testicle. Otherwise unremarkable study.   Medical Consultants:  Psychiatry Nerosurgery - Dr. Arnoldo Morale Oncology - Dr. Marin Olp Radiation oncology   Urology Dr. Louis Meckel  Other Consultants:  PT   IAnti-Infectives:   None    Kelvin Cellar, MD  [facility] Pager [phone]  Time spent in minutes: 25 minutes  If 7PM-7AM, please contact night-coverage [email or web address] Password [email or web address] 01/12/2015, 8:36 AM   LOS: 8 days    HPI/Subjective: Patient seems a little more awake today, can tell me his name, that this is a hospital, can not identify city, county, time or date.   Objective: Filed Vitals:   01/12/15 0500 01/12/15 0542 01/12/15 0600 01/12/15 0700  BP: 117/60  128/63 135/60  Pulse: 51  64 73  Temp:  100 F (37.8 C)    TempSrc:  Axillary    Resp: 19  20 20   Height:      Weight:      SpO2: 97%  97% 94%    Intake/Output Summary (Last 24 hours) at 01/12/15 0836 Last data filed at 01/12/15 0704  Gross per 24 hour  Intake 10408.75 ml  Output    876 ml  Net 9532.75 ml    Exam:   General:  Pt is confused however able to follow simple commands  Cardiovascular: Regular rate and rhythm, S1/S2, no murmurs  Respiratory: Clear to auscultation bilaterally, no wheezing, no crackles, no rhonchi  Abdomen: Soft, non tender, non distended, bowel sounds present  Extremities: No edema, pulses DP and PT palpable bilaterally  Neuro: Grossly nonfocal,not oriented  Data Reviewed: Basic  Metabolic Panel:  Recent Labs Lab 01/07/15 0150 01/08/15 0506 01/09/15 0508 01/10/15 0050 01/11/15 0543  NA 138 140 143 142 143  K 4.1 3.6 3.7 3.7 4.0  CL 103 106 110 109 109  CO2 28 24 26 25 25   GLUCOSE 141* 179* 135* 217* 161*  BUN 12 12 13 13 7   CREATININE 1.07 0.80 0.92 0.88 0.84  CALCIUM 8.6* 8.7* 8.8* 8.8* 8.9  MG  --  1.8 1.9  --   --    Liver Function Tests:  Recent Labs Lab 01/06/15 0535 01/07/15 0150  AST 17 18  ALT 20 20  ALKPHOS 48 46  BILITOT 0.9 1.1  PROT 5.7* 6.1*  ALBUMIN 3.1* 3.4*   No results for input(s): LIPASE, AMYLASE in the last 168 hours. No results for input(s): AMMONIA in the last 168 hours. CBC:  Recent Labs Lab 01/06/15 0535 01/07/15 0150  01/09/15 0508 01/10/15 0050 01/10/15 1006 01/11/15 0543 01/12/15 0255  WBC 5.9 6.6  < > 8.0 10.0 9.8 11.9* 10.9*  NEUTROABS 3.6 4.8  --   --   --   --   --   --   HGB 7.4* 8.8*  < > 7.8* 8.4* 8.4* 9.5* 9.0*  HCT 23.0* 26.6*  < > 24.1* 26.3* 26.2* 29.6* 28.6*  MCV 88.8 87.8  < > 88.3 91.3  89.4 89.2 91.1  PLT 267 276  < > 243 285 277 232 197  < > = values in this interval not displayed. Cardiac Enzymes: No results for input(s): CKTOTAL, CKMB, CKMBINDEX, TROPONINI in the last 168 hours. BNP: Invalid input(s): POCBNP CBG:  Recent Labs Lab 01/10/15 2204 01/11/15 0645 01/11/15 1155 01/11/15 1605 01/11/15 2211  GLUCAP 209* 146* 188* 158* 133*    Recent Results (from the past 240 hour(s))  Culture, Urine     Status: None (Preliminary result)   Collection Time: 01/10/15  2:32 PM  Result Value Ref Range Status   Specimen Description URINE, RANDOM  Final   Special Requests NONE  Final   Culture   Final    CULTURE REINCUBATED FOR BETTER GROWTH Performed at [facility]    Report Status PENDING  Incomplete  MRSA PCR Screening     Status: None   Collection Time: 01/11/15  2:46 PM  Result Value Ref Range Status   MRSA by PCR NEGATIVE NEGATIVE Final    Comment:        The  GeneXpert MRSA Assay (FDA approved for NASAL specimens only), is one component of a comprehensive MRSA colonization surveillance program. It is not intended to diagnose MRSA infection nor to guide or monitor treatment for MRSA infections.      Scheduled Meds: . Alogliptin-Pioglitazone  1 tablet Oral Daily  . aspirin EC  81 mg Oral Daily  . cholecalciferol  2,000 Units Oral Daily  . fluticasone  1 spray Each Nare Daily  . irbesartan  300 mg Oral Daily   And  . hydrochlorothiazide  25 mg Oral Daily  . metFORMIN  1,000 mg Oral BID WC  . multivitamin with minerals  1 tablet Oral Daily  . pantoprazole  40 mg Oral Daily  . potassium chloride SA  40 mEq Oral Once  . risperiDONE  0.5 mg Oral QHS  . vitamin E  200 Units Oral Daily   Continuous Infusions: . 0.9 % NaCl with KCl 20 mEq / L 50 mL/hr at 01/12/15 0500

## 2015-01-12 NOTE — Progress Notes (Signed)
Follow-up note  Aaron White was evaluated by Dr. Arnoldo Morale of neurosurgery today, repeat CT scan stable. Patient stable to be transferred back to [facility] for further treatment of probable glioblastoma. I spoke with Dr. Charlies Silvers at [facility] who has agreed to accept patient in transfer. He did spike a fever of 102.9 this afternoon. It appears that a urine culture drawn on 01/10/2015 had grown 60,000 colony-forming units of gram-negative rods. Blood cultures drawn on 01/11/2015 at 2200 hrs. showing no growth after overnight incubation. Will initiate empiric IV antibiotic therapy with ceftriaxone 1 g IV every 24 hours.  Transfer orders written for [facility].

## 2015-01-12 NOTE — Progress Notes (Signed)
   01/12/15 1658  Clinical Encounter Type  Visited With Patient and family together;Health care provider  Visit Type Follow-up;Post-op  Spiritual Encounters  Spiritual Needs Prayer;Emotional  Stress Factors  Patient Stress Factors Health changes  Family Stress Factors Health changes;Lack of knowledge   Chaplain followed up with patient, patient's wife, and patient's administrative assistant/friend to offer them support after the brain biopsy. According to the wife, patient's confusion is getting worse and has now acquired a fever. Patient's wife and friend were disappointed with the lack of communication from a doctor, and had some questions surrounding potentially transferring back to [facility] and the wait time for biopsy results. Chaplain relayed those questions to the RN. Chaplain also offered prayer and support, and our support is available as needed.   Jeri Lager, Chaplain 01/12/2015 5:02 PM

## 2015-01-12 NOTE — Progress Notes (Signed)
Patient feels very warm, having some episode of chills temperature orally shows below 99, axillary temp. Was 102 and rectal was 102.9. Was given tylenol per rectum. K. SchorrNP was notified with orders made, patient other vital signs stable. Blood culture was done and chest x-ray done. NS 512ml. IV bolus was given. Will continue to monitor temperature.

## 2015-01-12 NOTE — Progress Notes (Signed)
Patient ID: Aaron White, male   DOB: January 14, 1940, 75 y.o.   MRN: [id number] Subjective:  The patient is more alert. He is pleasant. He is mildly confused.  Objective: Vital signs in last 24 hours: Temp:  [97.9 F (36.6 C)-102.9 F (39.4 C)] 102 F (38.9 C) (01/05 1151) Pulse Rate:  [39-90] 66 (01/05 1300) Resp:  [19-27] 27 (01/05 1300) BP: (109-143)/(53-78) 143/69 mmHg (01/05 1300) SpO2:  [91 %-99 %] 97 % (01/05 1300)  Intake/Output from previous day: 01/04 0701 - 01/05 0700 In: 10508.8 [I.V.:10508.8] Out: 851 [Urine:851] Intake/Output this shift: Total I/O In: 223 [P.O.:120; I.V.:103] Out: 80 [Urine:75]  Physical exam the patient is alert and pleasant. He is much more alert today. He is mildly confused. His speech is normal. He is moving all 4 extremities well.  The patient's postoperative head CT performed this morning looks good.  Lab Results:  Recent Labs  01/11/15 0543 01/12/15 0255  WBC 11.9* 10.9*  HGB 9.5* 9.0*  HCT 29.6* 28.6*  PLT 232 197   BMET  Recent Labs  01/10/15 0050 01/11/15 0543  NA 142 143  K 3.7 4.0  CL 109 109  CO2 25 25  GLUCOSE 217* 161*  BUN 13 7  CREATININE 0.88 0.84  CALCIUM 8.8* 8.9    Studies/Results: Ct Head Wo Contrast  01/12/2015  CLINICAL DATA:  Elevated temperature.  Referring biopsy yesterday. EXAM: CT HEAD WITHOUT CONTRAST TECHNIQUE: Contiguous axial images were obtained from the base of the skull through the vertex without intravenous contrast. COMPARISON:  MRI brain 01/10/2015. CT head without contrast 12/31/2014 and MRI brain 01/05/2015. FINDINGS: A right parietal burr hole is noted. There minimal blood products along the biopsy tract, as expected. A mass lesion in the splenium of the corpus callosum is predominantly low-density. There is significantly more mass effect than on the 12/24 CT. No acute blood products are evident within the lesion. The lesion creates some mass effect on the posterior horns of the lateral  ventricles. 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 No other acute infarct is present. There is no significant extra-axial fluid collection. The ventricles are unremarkable otherwise. The calvarium is otherwise intact. The paranasal sinuses and mastoid air cells are clear. The globes and orbits are intact. IMPRESSION: 1. Interval burr hole in the right parietal calvarium minimal blood along the biopsy tract, within normal limits. 2. Bulky low-density mass lesion in the splenium of the corpus callosum. This is most concerning for a GBM. As previously discussed, lymphoma is also considered. Electronically Signed   By: San Morelle M.D.   On: 01/12/2015 06:45   Mr Jeri Cos [id number] Contrast  01/10/2015  ADDENDUM REPORT: 01/10/2015 20:19 ADDENDUM: After further investigation, patient did receive iron supplement (ferriheme) 01/07/2015 and 01/08/2015 which is felt to be responsible for the altered signal intensity of the brain mass. Therefore hemorrhage into the mass is felt unlikely as cause for the interval change. This was discussed with the patient's nurse Claiborne Billings. Electronically Signed   By: Genia Del M.D.   On: 01/10/2015 20:19  01/10/2015  CLINICAL DATA:  75 year old hypertensive diabetic male with increasing confusion. Subsequent encounter. EXAM: MRI HEAD WITHOUT AND WITH CONTRAST TECHNIQUE: Multiplanar, multiecho pulse sequences of the brain and surrounding structures were obtained without and with intravenous contrast. CONTRAST:  38mL MULTIHANCE GADOBENATE DIMEGLUMINE 529 MG/ML IV SOLN COMPARISON:  01/05/2015 brain MR. 12/31/2014 head CT. FINDINGS: Bulky irregular mass centered in the splenium of corpus callosum spans over  6.8 x 3.2 x 8 2.8 cm with extension into the posterior medial temporal lobes larger on the right. Marked change in appearance of this lesion on the pre contrast imaging (confirmed with technologist contrast was not given prior to sagittal T1 imaging). The  appearance suggests that the patient has bled into the tumor in the interim. Alternatively, the abnormal T1 imaging may be related to recent medication administration, but does not appear to be case after talking with the patient's nurse. Patient received iodinated contrast for inferior vena cava filter placement today however this should not cause this appearance. This could be further assessed on follow-up T1 weighted imaging to determine if this represents artifact or true hemorrhage. The lesion is highly suspicious for a glioblastoma. Lymphoma is a secondary less likely consideration. Demyelinating process felt to be a secondary much less likely consideration. There may be a tiny daughter lesion within the left temporal lobe. No acute infarct. Mild chronic small vessel disease type changes. Global atrophy without hydrocephalus. Major intracranial vascular structures are patent. IMPRESSION: Bulky lesion within the splenium corpus callosum with extension to the posterior medial type lobes is highly suspicious for glioblastoma with other considerations felt less likely as noted above. When compared to the prior examination, there has been a change in the signal characteristics of this lesion which may be related to interval hemorrhage into lesion (assuming that there is not a confounding factor such as administration of medication which causes T1 hyperintensity and gradient abnormality). This could be further assessed on follow-up T1 weighted imaging to determine if this represents artifact or true hemorrhage. There may be a tiny daughter lesion within the left temporal lobe. These results were called by telephone at the time of interpretation on 01/10/2015 at 7:29 pm to [facility] patients nurse who verbally acknowledged these results. Electronically Signed: By: Genia Del M.D. On: 01/10/2015 19:32   Dg Chest Port 1 View  01/11/2015  CLINICAL DATA:  Fever EXAM: PORTABLE CHEST 1 VIEW COMPARISON:  Chest CT 01/06/2015  FINDINGS: Normal cardiac silhouette. There is diffuse linear interstitial pattern in the lungs. No focal infiltrate. No pneumothorax. IMPRESSION: Interstitial edema pattern. Electronically Signed   By: Suzy Bouchard M.D.   On: 01/11/2015 23:03    Assessment/Plan: Postop day #1: The patient is doing well neurologically. It is okay from my point of view for him to return the [facility] for further management. The patient will need to have his staples removed in about a week. Please let me know if I can be of further assistance.  LOS: 8 days     Gibran Veselka D 01/12/2015, 3:30 PM

## 2015-01-12 NOTE — Progress Notes (Signed)
Placed patient on CPAP 10cmH20 with 2L 02 bleed in.  Patient is tolerating well at this time. 

## 2015-01-12 NOTE — Progress Notes (Signed)
[place] Progress Note Patient Name: MARLANDO MIYATA DOB: 12-22-1940 MRN: [id number]   Date of Service  01/12/2015  HPI/Events of Note  Notified of GNR in urine culture from 1/3. Not currently on antibiotics & febrile. Normal WBC.  eICU Interventions  Notified Dr. Coralyn Pear.      Intervention Category Major Interventions: Infection - evaluation and management  Tera Partridge 01/12/2015, 4:04 PM

## 2015-01-12 NOTE — Progress Notes (Signed)
Patient transferred to [facility] via [place].  Unable to hang antibiotic before transferring.  Blood sugar checked an covered.  Report called to Butch Penny on [place].

## 2015-01-12 NOTE — Progress Notes (Signed)
Patient only voided 46ml and 61ml. inspite of the bolus earlier, denies any bladder discomfort, bladder scanned showed only 268ml of urine. Will continue to monitor and K. SchorrNP was notified and made aware will continue to monitor urine oputput.

## 2015-01-12 NOTE — Progress Notes (Signed)
Patient arrived to unit at around 1758 via [facility]. Patient is lethargic but will respond to voice and will answer simple questions, also verbalized to use urinal on arrival. Speech is clear.Placed comfortably in bed. Denies pain.IV antibiotic- Rocephin infusing at this time. Will continue to monitor.

## 2015-01-13 ENCOUNTER — Ambulatory Visit
Admit: 2015-01-13 | Discharge: 2015-01-13 | Disposition: A | Discharge status: Home / Self Care | Payer: Medicare Other | Attending: Radiation Oncology | Admitting: Radiation Oncology

## 2015-01-13 DIAGNOSIS — K922 Gastrointestinal hemorrhage, unspecified: Secondary | ICD-10-CM

## 2015-01-13 LAB — GLUCOSE, CAPILLARY
GLUCOSE-CAPILLARY: 146 mg/dL — AB (ref 65–99)
GLUCOSE-CAPILLARY: 249 mg/dL — AB (ref 65–99)
Glucose-Capillary: 135 mg/dL — ABNORMAL HIGH (ref 65–99)
Glucose-Capillary: 246 mg/dL — ABNORMAL HIGH (ref 65–99)

## 2015-01-13 LAB — URINE CULTURE

## 2015-01-13 LAB — HEPARIN LEVEL (UNFRACTIONATED): Heparin Unfractionated: 0.14 IU/mL — ABNORMAL LOW (ref 0.30–0.70)

## 2015-01-13 MED ORDER — DEXAMETHASONE 4 MG PO TABS
4.0000 mg | ORAL_TABLET | Freq: Four times a day (QID) | ORAL | Status: DC
  Administered 2015-01-13 – 2015-01-15 (×10): 4 mg via ORAL
  Filled 2015-01-13 (×10): qty 1

## 2015-01-13 MED ORDER — INSULIN ASPART 100 UNIT/ML ~~LOC~~ SOLN
0.0000 [IU] | Freq: Three times a day (TID) | SUBCUTANEOUS | Status: DC
  Administered 2015-01-13 (×2): 2 [IU] via SUBCUTANEOUS
  Administered 2015-01-13: 5 [IU] via SUBCUTANEOUS
  Administered 2015-01-14: 2 [IU] via SUBCUTANEOUS
  Administered 2015-01-14: 8 [IU] via SUBCUTANEOUS
  Administered 2015-01-14: 3 [IU] via SUBCUTANEOUS
  Administered 2015-01-15 (×2): 5 [IU] via SUBCUTANEOUS
  Administered 2015-01-15: 3 [IU] via SUBCUTANEOUS

## 2015-01-13 MED ORDER — LEVETIRACETAM 500 MG PO TABS
500.0000 mg | ORAL_TABLET | Freq: Two times a day (BID) | ORAL | Status: DC
  Administered 2015-01-13 – 2015-01-18 (×12): 500 mg via ORAL
  Filled 2015-01-13 (×12): qty 1

## 2015-01-13 MED ORDER — HEPARIN (PORCINE) IN NACL 100-0.45 UNIT/ML-% IJ SOLN
1300.0000 [IU]/h | INTRAMUSCULAR | Status: DC
  Administered 2015-01-13: 1300 [IU]/h via INTRAVENOUS
  Filled 2015-01-13: qty 250

## 2015-01-13 MED ORDER — HEPARIN (PORCINE) IN NACL 100-0.45 UNIT/ML-% IJ SOLN
1500.0000 [IU]/h | INTRAMUSCULAR | Status: DC
  Administered 2015-01-14: 1400 [IU]/h via INTRAVENOUS
  Administered 2015-01-14 – 2015-01-15 (×2): 1500 [IU]/h via INTRAVENOUS
  Filled 2015-01-13 (×7): qty 250

## 2015-01-13 NOTE — Progress Notes (Signed)
[facility] PROGRESS NOTE  Aaron White [id number] DOB: [birth year] DOA: 01/04/2015 PCP: Horatio Pel, MD  HPI/Brief narrative  75 y.o. male with past medical history significant for hypertension, diabetes, obstructive sleep apnea on CPAP, recently hospitalized for GI bleed who now presented to [facility] long hospital because of worsening confusion over past few days prior to this admission. Per patient's family, ever since discharge 12/26/2014 patient was not really himself. Patient would say things such as him being afraid of dying and that he is losing the mammary and he wanted to see his son in [place] but was worried as he has a fear of flying.   Patient was hemodynamically stable on the admission. Blood work was significant for hemoglobin of 8.1, otherwise unremarkable. He was admitted for further evaluation of acute encephalopathy. 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 Neurosurgery, radiation oncology and oncology consulted.   Assessment/Plan: Acute encephalopathy / Glioblastoma multiforme  - MRI brain with findings of tumor infiltrating the splenium of the corpus callosum and crossing bilaterally into the posterior medial temporal lobes, findings were suspicious for glioblastoma multiforme.  - Neurosurgery, radiation oncology and oncology consulted. - Dr. Arnoldo Morale of neurosurgerywas consulted and patient is status post biopsy on 1/4 -S/P stereotactic brain biopsy on 01/11/2015. He tolerated the procedure well there were no complications.  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 Management to be deferred to oncology, will follow  PE/DVT:  -CT scan of lungs with IV contrast performed on 01/06/2015 showing small pulmonary  embolus noted within a segmental branch of the left lower lobe. Bilateral lower extremity Dopplers performed on 01/08/2015 showing acute DVT involving left posterior tibial vein. -Started on heparin drip on admission, Heparin drip was stopped on 1/3 due to new onset of gross hematuria,  -IVC filter placed by IR on 1/3 at [facility]  Gross hematuria/3.1 cm bladder vs prostate mass:  -Heparin drip stopped, ua/urine culture ordered on 1/3, consider abx if hematuria persists or fever, urology consulted -CT findings of prostate likely a benign etiology. Urology recommending cystoscopy in the outpatient setting. No further interventions during this hospitalization recommended  Anemia of chronic disease and iron deficiency anemia and blood loss anemia for hematuria, dose has h/o gi bleed from hemorrhoids/diverticula, stool occult blood this admission negative. - Recent GI bleed and colonoscopy 12/28/2014 with finding of hemorrhoids -- Aspirin placed on hold, Continue protonix daily  - Transfuse 1 unit PRBC on 12/30, 1uint on 1/3 -s/p iv iron , stool occult blood daily -transfuse prbc to keep hgb>7. -On 01/12/2015 labs show a hemoglobin of 9.0.  Frequent urination and prostate pain -Enlarged prostate on ct ab: ua unremarkable, psa 4.3 on 12/29 and 2.69 on 1/1 , started flomax, urology recommending outpatient cystoscopy when stable  Essential hypertension - bp stable so far, hold HCTZ due to poor oral intake, continue avapro -on gentle hydration due to poor oral intake  Controlled diabetes mellitus type 2 without complications without long-term insulin use - Continue SSI, held metformin - Our pharmacy does not carry alogliptin-Pioglitazone  Depression/anxiety - Continue risperidone 0.5 mg at bedtime, xanax q8hrs prn and qhs prn - Psych consulted 01/07/2015 , recommended trial of zoloft  DVT Prophylaxis  - SCD's , was on heparin drip, now s/p ivf filter  Code Status: Full Family  Communication: Pt in room  Disposition Plan: Unknown at this time   Consultants: Psychiatry Nerosurgery - Dr. Arnoldo Morale Oncology - Dr. Marin Olp Radiation oncology  Urology Dr. Louis Meckel  Procedures:  Stereotactic brain biopsy 01/11/2015  Antibiotics: Anti-infectives    Start     Dose/Rate Route Frequency Ordered Stop   01/12/15 1630  cefTRIAXone (ROCEPHIN) 1 g in dextrose 5 % 50 mL IVPB     1 g 100 mL/hr over 30 Minutes Intravenous Every 24 hours 01/12/15 1605     01/11/15 1800  ceFAZolin (ANCEF) IVPB 2 g/50 mL premix     2 g 100 mL/hr over 30 Minutes Intravenous Every 8 hours 01/11/15 1440 01/12/15 0217   01/11/15 1025  bacitracin 50,000 Units in sodium chloride irrigation 0.9 % 500 mL irrigation  Status:  Discontinued       As needed 01/11/15 1025 01/11/15 1107       HPI/Subjective: Patient closely confused at present, no complaints  Objective: Filed Vitals:   01/12/15 2131 01/12/15 2318 01/13/15 0508 01/13/15 1435  BP: 144/61  147/61 145/69  Pulse: 75  62 71  Temp: 97.8 F (36.6 C)  98.1 F (36.7 C) 98.4 F (36.9 C)  TempSrc: Oral  Oral Oral  Resp: 20  20 18   Height:      Weight:      SpO2: 96% 97% 97% 98%    Intake/Output Summary (Last 24 hours) at 01/13/15 1703 Last data filed at 01/13/15 1550  Gross per 24 hour  Intake    983 ml  Output    200 ml  Net    783 ml   Filed Weights   01/04/15 2059 01/12/15 1759  Weight: 95.255 kg (210 lb) 86.183 kg (190 lb)    Exam:   General:  Awake, in nad  Cardiovascular: regular, s1, s2  Respiratory: normal resp effort, no wheezing  Abdomen: soft,nondistended  Musculoskeletal: perfused, no clubbing   Data Reviewed: Basic Metabolic Panel:  Recent Labs Lab 01/07/15 0150 01/08/15 0506 01/09/15 0508 01/10/15 0050 01/11/15 0543  NA 138 140 143 142 143  K 4.1 3.6 3.7 3.7 4.0  CL 103 106 110 109 109  CO2 28 24 26 25 25   GLUCOSE 141* 179* 135* 217* 161*  BUN 12 12 13 13 7   CREATININE 1.07 0.80 0.92  0.88 0.84  CALCIUM 8.6* 8.7* 8.8* 8.8* 8.9  MG  --  1.8 1.9  --   --    Liver Function Tests:  Recent Labs Lab 01/07/15 0150  AST 18  ALT 20  ALKPHOS 46  BILITOT 1.1  PROT 6.1*  ALBUMIN 3.4*   No results for input(s): LIPASE, AMYLASE in the last 168 hours. No results for input(s): AMMONIA in the last 168 hours. CBC:  Recent Labs Lab 01/07/15 0150  01/09/15 0508 01/10/15 0050 01/10/15 1006 01/11/15 0543 01/12/15 0255  WBC 6.6  < > 8.0 10.0 9.8 11.9* 10.9*  NEUTROABS 4.8  --   --   --   --   --   --   HGB 8.8*  < > 7.8* 8.4* 8.4* 9.5* 9.0*  HCT 26.6*  < > 24.1* 26.3* 26.2* 29.6* 28.6*  MCV 87.8  < > 88.3 91.3 89.4 89.2 91.1  PLT 276  < > 243 285 277 232 197  < > = values in this interval not displayed. Cardiac Enzymes: No results for input(s): CKTOTAL, CKMB, CKMBINDEX, TROPONINI in the last 168 hours. BNP (last 3 results) No results for input(s): BNP in the last 8760  hours.  ProBNP (last 3 results) No results for input(s): PROBNP in the last 8760 hours.  CBG:  Recent Labs Lab 01/12/15 1148 01/12/15 1549 01/12/15 1721 01/12/15 2128 01/13/15 0747  GLUCAP 154* 137* 138* 198* 135*    Recent Results (from the past 240 hour(s))  Culture, Urine     Status: None   Collection Time: 01/10/15  2:32 PM  Result Value Ref Range Status   Specimen Description URINE, RANDOM  Final   Special Requests NONE  Final   Culture   Final    60,000 COLONIES/ml CITROBACTER KOSERI 50,000 COLONIES/mL ESCHERICHIA COLI Confirmed Extended Spectrum Beta-Lactamase Producer (ESBL) Performed at [facility]    Report Status 01/13/2015 FINAL  Final   Organism ID, Bacteria CITROBACTER KOSERI  Final   Organism ID, Bacteria ESCHERICHIA COLI  Final      Susceptibility   Citrobacter koseri - MIC*    CEFAZOLIN <=4 SENSITIVE Sensitive     CEFTRIAXONE <=1 SENSITIVE Sensitive     CIPROFLOXACIN <=0.25 SENSITIVE Sensitive     GENTAMICIN <=1 SENSITIVE Sensitive     IMIPENEM <=0.25  SENSITIVE Sensitive     NITROFURANTOIN 64 INTERMEDIATE Intermediate     TRIMETH/SULFA <=20 SENSITIVE Sensitive     PIP/TAZO <=4 SENSITIVE Sensitive     * 60,000 COLONIES/ml CITROBACTER KOSERI   Escherichia coli - MIC*    AMPICILLIN >=32 RESISTANT Resistant     CEFAZOLIN >=64 RESISTANT Resistant     CEFTRIAXONE >=64 RESISTANT Resistant     CIPROFLOXACIN <=0.25 SENSITIVE Sensitive     GENTAMICIN <=1 SENSITIVE Sensitive     IMIPENEM <=0.25 SENSITIVE Sensitive     NITROFURANTOIN <=16 SENSITIVE Sensitive     TRIMETH/SULFA >=320 RESISTANT Resistant     AMPICILLIN/SULBACTAM >=32 RESISTANT Resistant     PIP/TAZO <=4 SENSITIVE Sensitive     * 50,000 COLONIES/mL ESCHERICHIA COLI  MRSA PCR Screening     Status: None   Collection Time: 01/11/15  2:46 PM  Result Value Ref Range Status   MRSA by PCR NEGATIVE NEGATIVE Final    Comment:        The GeneXpert MRSA Assay (FDA approved for NASAL specimens only), is one component of a comprehensive MRSA colonization surveillance program. It is not intended to diagnose MRSA infection nor to guide or monitor treatment for MRSA infections.   Culture, blood (routine x 2)     Status: None (Preliminary result)   Collection Time: 01/11/15 10:16 PM  Result Value Ref Range Status   Specimen Description BLOOD LEFT HAND  Final   Special Requests IN PEDIATRIC BOTTLE 3CC  Final   Culture  Setup Time   Final    GRAM POSITIVE COCCI IN CLUSTERS AEROBIC BOTTLE ONLY CRITICAL RESULT CALLED TO, READ BACK BY AND VERIFIED WITH: Farrell Ours RN [id number] 01/12/15 A BROWNING    Culture   Final    GRAM POSITIVE COCCI CULTURE REINCUBATED FOR BETTER GROWTH    Report Status PENDING  Incomplete  Culture, blood (routine x 2)     Status: None (Preliminary result)   Collection Time: 01/11/15 10:22 PM  Result Value Ref Range Status   Specimen Description BLOOD RIGHT ANTECUBITAL  Final   Special Requests IN PEDIATRIC BOTTLE 3CC  Final   Culture NO GROWTH 2 DAYS  Final   Report  Status PENDING  Incomplete     Studies: Ct Head Wo Contrast  01/12/2015  CLINICAL DATA:  Elevated temperature.  Referring biopsy yesterday. EXAM: CT HEAD WITHOUT CONTRAST  TECHNIQUE: Contiguous axial images were obtained from the base of the skull through the vertex without intravenous contrast. COMPARISON:  MRI brain 01/10/2015. CT head without contrast 12/31/2014 and MRI brain 01/05/2015. FINDINGS: A right parietal burr hole is noted. There minimal blood products along the biopsy tract, as expected. A mass lesion in the splenium of the corpus callosum is predominantly low-density. There is significantly more mass effect than on the 12/24 CT. No acute blood products are evident within the lesion. The lesion creates some mass effect on the posterior horns of the lateral ventricles. 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 No other acute infarct is present. There is no significant extra-axial fluid collection. The ventricles are unremarkable otherwise. The calvarium is otherwise intact. The paranasal sinuses and mastoid air cells are clear. The globes and orbits are intact. IMPRESSION: 1. Interval burr hole in the right parietal calvarium minimal blood along the biopsy tract, within normal limits. 2. Bulky low-density mass lesion in the splenium of the corpus callosum. This is most concerning for a GBM. As previously discussed, lymphoma is also considered. Electronically Signed   By: San Morelle M.D.   On: 01/12/2015 06:45   Dg Chest Port 1 View  01/11/2015  CLINICAL DATA:  Fever EXAM: PORTABLE CHEST 1 VIEW COMPARISON:  Chest CT 01/06/2015 FINDINGS: Normal cardiac silhouette. There is diffuse linear interstitial pattern in the lungs. No focal infiltrate. No pneumothorax. IMPRESSION: Interstitial edema pattern. Electronically Signed   By: Suzy Bouchard M.D.   On: 01/11/2015 23:03    Scheduled Meds: . cefTRIAXone (ROCEPHIN)  IV  1 g Intravenous Q24H  . cholecalciferol   2,000 Units Oral Daily  . dexamethasone  4 mg Oral 4 times per day  . feeding supplement (GLUCERNA SHAKE)  237 mL Oral TID BM  . finasteride  5 mg Oral Daily  . fluticasone  1 spray Each Nare Daily  . insulin aspart  0-15 Units Subcutaneous TID WC  . irbesartan  300 mg Oral Daily  . levETIRAcetam  500 mg Oral BID  . multivitamin with minerals  1 tablet Oral Daily  . pantoprazole (PROTONIX) IV  40 mg Intravenous QHS  . risperiDONE  0.5 mg Oral QHS  . sertraline  25 mg Oral Daily  . sodium chloride  3 mL Intravenous Q12H  . tamsulosin  0.4 mg Oral Daily  . vitamin E  200 Units Oral Daily   Continuous Infusions: . 0.9 % NaCl with KCl 20 mEq / L 1,000 mL (01/13/15 1510)  . heparin 1,300 Units/hr (01/13/15 0810)    Principal Problem:   Acute encephalopathy Active Problems:   Essential hypertension   GBM (glioblastoma multiforme) (HCC)   Anemia of chronic disease   Depression   Controlled type 2 diabetes mellitus without complication, without long-term current use of insulin (HCC)   Anxious mood   Confusion   Iron deficiency   Acute pulmonary embolism (HCC)   Mass, brain   Gross hematuria   Brain tumor ([place])   Theodor Mustin, [facility] Pager [phone]. If 7PM-7AM, please contact night-coverage at [email or web address], password [facility] 01/13/2015, 5:03 PM  LOS: 9 days

## 2015-01-13 NOTE — Progress Notes (Signed)
ANTICOAGULATION CONSULT NOTE - Initial Consult  Pharmacy Consult for heparin Indication: pulmonary embolus  Allergies  Allergen Reactions  . Strawberry Flavor Hives  . Penicillins Rash    Has patient had a PCN reaction causing immediate rash, facial/tongue/throat swelling, SOB or lightheadedness with hypotension: No Has patient had a PCN reaction causing severe rash involving mucus membranes or skin necrosis: No Has patient had a PCN reaction that required hospitalization No Has patient had a PCN reaction occurring within the last 10 years: No If all of the above answers are "NO", then may proceed with Cephalosporin use.     Patient Measurements: Height: 5\' 11"  (180.3 cm) Weight: 190 lb (86.183 kg) IBW/kg (Calculated) : 75.3   Vital Signs: Temp: 98.1 F (36.7 C) (01/06 0508) Temp Source: Oral (01/06 0508) BP: 147/61 mmHg (01/06 0508) Pulse Rate: 62 (01/06 0508)  Labs:  Recent Labs  01/10/15 1006 01/11/15 0543 01/12/15 0255  HGB 8.4* 9.5* 9.0*  HCT 26.2* 29.6* 28.6*  PLT 277 232 197  CREATININE  --  0.84  --     Estimated Creatinine Clearance: 80.9 mL/min (by C-G formula based on Cr of 0.84).     Assessment: 75 y.o. male with past medical history significant for hypertension, diabetes, obstructive sleep apnea on CPAP, recently hospitalized for GI bleed who presented to [facility] long hospital because of worsening confusion over past few days prior to this admission.  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 S/P stereotactic brain biopsy on 01/11/2015. He tolerated the procedure well there were no complications.  Pharmacy consulted to continue heparin drip for PE.  01/12/15 Hgb 9.0 HCT 28.6 Plts 197  Goal of Therapy:  Heparin level 0.3-0.5 units/ml Monitor platelets by anticoagulation protocol    Plan:  -No bolus, Start heparin drip at 1300 units/ml (therapeutic at this rate on 1/3) -check heparin level in 8 hours -daily heparin level and CBC -continue to monitor H&H and platelets  Dolly Rias RPh 01/13/2015, 7:32 AM Pager [phone]

## 2015-01-13 NOTE — Progress Notes (Signed)
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 He is more alert than when I last saw him.  The results of his biopsy is still not back yet. This makes wonder if this is not a glioblastoma and possibly is a lymphoma.  I think the real issue with him is going to be anticoagulation. I think that he does need anticoagulation given that he has a brain tumor.  He does have a filter in. However, I think that he would be asked of him risk for thrombus progression.  I think for now, it would be easiest to put him on a heparin infusion.  As an outpatient, I think that he is probably going to need Lovenox. I realize this is much more manic convenience but given his overall situation, I think it would be safest to have him on Lovenox.  There is no obvious bleeding. He does have the issue with diverticular bleed. Currently, his hemoglobin is 9.0.  He does have iron deficiency. He has got iron in the past.  He is not on Decadron. He probably needs to be on Decadron given his brain tumor. His blood sugars will need to be watched.  He clearly will need quite a bit of physical therapy. I suspect he might need to go to a rehabilitation facility upon discharge.  His physical exam shows no focal neurological deficits. His temperature 98.1. Blood pressure 147/61. He moves all extremities spontaneously. He has good strength bilaterally. He has good breath sounds bilaterally. Cardiac exam regular rate and rhythm. Abdomen is soft.  We will have pharmacy help [place] with the heparin for right now. I will then switch him over to Lovenox.  Hopefully, the biopsy will be out today.  [name] E.  Ephesians 5:17

## 2015-01-13 NOTE — Clinical Social Work Note (Signed)
Clinical Social Work Assessment  Patient Details  Name: Aaron White MRN: [id number] Date of Birth: 07-17-1940  Date of referral:  01/13/15               Reason for consult:  Discharge Planning                Permission sought to share information with:  Family Supports Permission granted to share information::     Name::     Bun Rinne  Agency::     Relationship::  wife  Contact Information:  [phone]  Housing/Transportation Living arrangements for the past 2 months:  Single Family Home Source of Information:  Other (Comment Required) (friend) Patient Interpreter Needed:  None Criminal Activity/Legal Involvement Pertinent to Current Situation/Hospitalization:  No - Comment as needed Significant Relationships:  Spouse Lives with:  Spouse Do you feel safe going back to the place where you live?  No Need for family participation in patient care:  Yes (Comment)  Care giving concerns:  CSW received notification from [facility] that PT has now changed recommendation from Home with [facility] to SNF.    Social Worker assessment / plan:  CSW received referral from [facility] for New SNF.  CSW visited pt room. Pt sleeping. Pt wife present and had multiple visitors. Pt family friend/helper, DeeDee came to doorway to speak with CSW. CSW introduced self and explained role. CSW discussed with pt family friend reason for CSW visit. Family friend, Freada Bergeron reports that radiologist spoke to pt wife today and mentioned recommendation for [facility] may be appropriate. Per family friend, radiologist planned to speak with other MD's to determine what best recommendation is. Pt friend reports that pt wife recognizes that pt will not be able to return home, but awaiting further guidance from MD's about if [facility] is best recommendation vs. Exploring rehab at [facility]. CSW discussed that CSW will continue to follow MD notes for recommendations in order to assist as appropriate. Pt friend reports that will be best  plan and to follow up with pt wife on another day as pt wife has been overwhelmed today with information. Pt friend reports that pt wife would be open to [facility] if that is recommendation.   CSW to await further recommendations from MD to assist with appropriate disposition planning rather that be rehab at SNF vs. [facility].   CSW to continue to follow to provide support and assist with pt disposition needs.   Employment status:  Retired Forensic scientist:  Medicare PT Recommendations:  [place] / Referral to community resources:  Other (Comment Required) (recommendations pending)  Patient/Family's Response to care:  Pt sleeping at this time and unable to participate in assessment. Pt wife with visitors at this time. Pt family friend, Freada Bergeron reports that pt sons plan to travel to [place] soon from [place]. Pt family friend reports pt wife has been very strong, but has been emotional today.   Patient/Family's Understanding of and Emotional Response to Diagnosis, Current Treatment, and Prognosis:  Pt family friend supportive of pt and pt wife during this time and assisting pt wife with managing information and processing. Pt wife appears to be coping as well as to be anticipated given amount of medical information and processing this information.  Emotional Assessment Appearance:  Appears stated age Attitude/Demeanor/Rapport:  Unable to Assess (pt sleeping) Affect (typically observed):  Unable to Assess (pt sleeping) Orientation:  Oriented to Self, Oriented to Place Alcohol / Substance use:  Not Applicable Psych involvement (Current and /or in the community):  No (Comment)  Discharge Needs  Concerns to be addressed:  Discharge Planning Concerns Readmission within the last 30 days:  No Current discharge risk:  Physical Impairment Barriers to Discharge:  Continued Medical Work up   Alison Murray A, [place] 01/13/2015, 3:40 PM [phone]

## 2015-01-13 NOTE — Care Management Note (Signed)
Case Management Note  Patient Details  Name: Aaron White MRN: [id number] Date of Birth: Dec 03, 1940  Subjective/Objective:         74 yo admitted with Acute encephalopathy           Action/Plan: From home with spouse  Expected Discharge Date:   (unknown)               Expected Discharge Plan:  Skilled Nursing Facility  In-House Referral:  Clinical Social Work  Discharge planning Services  CM Consult  Post Acute Care Choice:    Choice offered to:     DME Arranged:    DME Agency:     HH Arranged:    [place] Agency:     Status of Service:  Completed, signed off  Medicare Important Message Given:  Yes Date Medicare IM Given:    Medicare IM give by:    Date Additional Medicare IM Given:    Additional Medicare Important Message give by:     If discussed at [place] of Stay Meetings, dates discussed:    Additional Comments: PT recommending SNF. Lynnell Catalan, RN 01/13/2015, 2:45 PM

## 2015-01-13 NOTE — Progress Notes (Signed)
ANTICOAGULATION CONSULT NOTE - F/u consult  Pharmacy Consult for heparin Indication: pulmonary embolus  Allergies  Allergen Reactions  . Strawberry Flavor Hives  . Penicillins Rash    Has patient had a PCN reaction causing immediate rash, facial/tongue/throat swelling, SOB or lightheadedness with hypotension: No Has patient had a PCN reaction causing severe rash involving mucus membranes or skin necrosis: No Has patient had a PCN reaction that required hospitalization No Has patient had a PCN reaction occurring within the last 10 years: No If all of the above answers are "NO", then may proceed with Cephalosporin use.     Patient Measurements: Height: 5\' 11"  (180.3 cm) Weight: 190 lb (86.183 kg) IBW/kg (Calculated) : 75.3   Vital Signs: Temp: 98.4 F (36.9 C) (01/06 1435) Temp Source: Oral (01/06 1435) BP: 145/69 mmHg (01/06 1435) Pulse Rate: 71 (01/06 1435)  Labs:  Recent Labs  01/11/15 0543 01/12/15 0255 01/13/15 1705  HGB 9.5* 9.0*  --   HCT 29.6* 28.6*  --   PLT 232 197  --   HEPARINUNFRC  --   --  0.14*  CREATININE 0.84  --   --     Estimated Creatinine Clearance: 80.9 mL/min (by C-G formula based on Cr of 0.84).     Assessment: 75 y.o. male with past medical history significant for hypertension, diabetes, obstructive sleep apnea on CPAP, recently hospitalized for GI bleed who presented to [facility] long hospital because of worsening confusion over past few days prior to this admission.  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 S/P stereotactic brain biopsy on 01/11/2015. He tolerated the procedure well there were no complications.  Pharmacy consulted to continue heparin drip for PE.  01/12/15 Hgb 9.0 HCT 28.6 Plts 197 01/13/15 1705 HL=0.14, no problems reported per RN  Goal of Therapy:  Heparin  level 0.3-0.5 units/ml Monitor platelets by anticoagulation protocol   Plan:  -Increase heparin drip to 1400 units/hr -Recheck heparin level in 8 hours -daily heparin level and CBC -continue to monitor H&H and platelets  Dorrene German 01/13/2015, 5:48 PM

## 2015-01-13 NOTE — Progress Notes (Signed)
Pharmacy notified on the heparin level.

## 2015-01-13 NOTE — Progress Notes (Signed)
Blood culture results: aerobic bottle gram positive cocci and clusters

## 2015-01-13 NOTE — Progress Notes (Signed)
PT Cancellation Note  Patient Details Name: Aaron White MRN: [id number] DOB: 09-01-1940   Cancelled Treatment:    Reason Eval/Treat Not Completed: Fatigue/lethargy limiting ability to participate (pt stated he's tired and wants to nap, he requested PT attempt this afternoon. Will follow. )   Philomena Doheny 01/13/2015, 11:26 AM [phone]

## 2015-01-13 NOTE — Progress Notes (Addendum)
Physical Therapy Treatment Patient Details Name: Aaron White MRN: [id number] DOB: 11-17-1940 Today's Date: 01/13/2015    History of Present Illness 75 yo male admitted with AMS. Hx of HTN,DM, sleep apnea, bil knee OA. Dx: brain tumor    PT Comments    Pt has had a decline in activity tolerance. He reported dizziness in standing so was unable to tolerate ambulation. MOd assist for bed to recliner transfer. Now recommending SNF. PT goals downgraded.  Follow Up Recommendations  Supervision/Assistance - 24 hour;SNF     Equipment Recommendations  Rolling walker with 5" wheels    Recommendations for Other Services       Precautions / Restrictions Precautions Precautions: Fall Restrictions Weight Bearing Restrictions: No    Mobility  Bed Mobility Overal bed mobility: Modified Independent             General bed mobility comments: increased time, HOB up 25*  Transfers Overall transfer level: Needs assistance Equipment used: Rolling walker (2 wheeled) Transfers: Stand Pivot Transfers;Sit to/from Stand Sit to Stand: From elevated surface;Mod assist Stand pivot transfers: Min assist       General transfer comment: mod A to rise and steady, dizzy upon standing so did not ambulate, assisted pt to pivot to recliner; increased time for SPT  Ambulation/Gait     Assistive device:  (IV pole)       General Gait Details: VCs posture. slow gait speed. noted shorter step length bilaterally on today. small amoun of assist to stabilize intemittently.    Stairs            Wheelchair Mobility    Modified Rankin (Stroke Patients Only)       Balance     Sitting balance-Leahy Scale: Good     Standing balance support: Bilateral upper extremity supported Standing balance-Leahy Scale: Poor Standing balance comment: BUE support on RW due to dizziness                    Cognition Arousal/Alertness: Awake/alert Behavior During Therapy: WFL for tasks  assessed/performed Overall Cognitive Status: Impaired/Different from baseline Area of Impairment: Orientation;Memory;Safety/judgement Orientation Level: Place       Safety/Judgement: Decreased awareness of safety     General Comments: Pt stated, "I'm not sure where I am. Am I at [facility]? I'm a little disoriented."    Exercises General Exercises - Upper Extremity Shoulder Flexion: AROM;Both;10 reps;Seated    General Comments        Pertinent Vitals/Pain Pain Score: 4  Pain Location: groin area Pain Descriptors / Indicators: Aching Pain Intervention(s): Monitored during session    Home Living                      Prior Function            PT Goals (current goals can now be found in the care plan section) Acute Rehab PT Goals Patient Stated Goal: to get TKA's done in Summer 2017. PT Goal Formulation: With patient/family Time For Goal Achievement: 01/20/15 Potential to Achieve Goals: Fair Progress towards PT goals: Not progressing toward goals - comment (weakness and dizziness limiting progress)    Frequency  Min 3X/week    PT Plan Discharge plan needs to be updated    Co-evaluation             End of Session Equipment Utilized During Treatment: Gait belt Activity Tolerance: Patient tolerated treatment well Patient left: with call bell/phone within reach;in chair;with family/visitor  present     Time: 1400-1425 PT Time Calculation (min) (ACUTE ONLY): 25 min  Charges:  $Therapeutic Activity: 23-37 mins                    G Codes:      Philomena Doheny 01/13/2015, 2:33 PM [phone]

## 2015-01-14 LAB — GLUCOSE, CAPILLARY
GLUCOSE-CAPILLARY: 150 mg/dL — AB (ref 65–99)
GLUCOSE-CAPILLARY: 290 mg/dL — AB (ref 65–99)
GLUCOSE-CAPILLARY: 288 mg/dL — AB (ref 65–99)
Glucose-Capillary: 169 mg/dL — ABNORMAL HIGH (ref 65–99)

## 2015-01-14 LAB — CULTURE, BLOOD (ROUTINE X 2)

## 2015-01-14 LAB — CBC
HCT: 28.5 % — ABNORMAL LOW (ref 39.0–52.0)
Hemoglobin: 9 g/dL — ABNORMAL LOW (ref 13.0–17.0)
MCH: 29.1 pg (ref 26.0–34.0)
MCHC: 31.6 g/dL (ref 30.0–36.0)
MCV: 92.2 fL (ref 78.0–100.0)
PLATELETS: 177 10*3/uL (ref 150–400)
RBC: 3.09 MIL/uL — ABNORMAL LOW (ref 4.22–5.81)
RDW: 15.2 % (ref 11.5–15.5)
WBC: 8 10*3/uL (ref 4.0–10.5)

## 2015-01-14 LAB — HEPARIN LEVEL (UNFRACTIONATED)
Heparin Unfractionated: 0.29 IU/mL — ABNORMAL LOW (ref 0.30–0.70)
Heparin Unfractionated: 0.36 IU/mL (ref 0.30–0.70)

## 2015-01-14 MED ORDER — SODIUM CHLORIDE 0.9 % IV SOLN
500.0000 mg | Freq: Three times a day (TID) | INTRAVENOUS | Status: DC
  Administered 2015-01-14 – 2015-01-17 (×10): 500 mg via INTRAVENOUS
  Filled 2015-01-14 (×10): qty 500

## 2015-01-14 NOTE — Progress Notes (Signed)
ANTIBIOTIC CONSULT NOTE - INITIAL  Pharmacy Consult for Primaxin Indication: ESBL E coli in urine cx  Allergies  Allergen Reactions  . Strawberry Flavor Hives  . Penicillins Rash    Has patient had a PCN reaction causing immediate rash, facial/tongue/throat swelling, SOB or lightheadedness with hypotension: No Has patient had a PCN reaction causing severe rash involving mucus membranes or skin necrosis: No Has patient had a PCN reaction that required hospitalization No Has patient had a PCN reaction occurring within the last 10 years: No If all of the above answers are "NO", then may proceed with Cephalosporin use.    Patient Measurements: Height: 5\' 11"  (180.3 cm) Weight: 190 lb (86.183 kg) IBW/kg (Calculated) : 75.3  Vital Signs: Temp: 97.6 F (36.4 C) (01/07 0515) Temp Source: Axillary (01/07 0515) BP: 137/74 mmHg (01/07 0515) Pulse Rate: 59 (01/07 0515) Intake/Output from previous day: 01/06 0701 - 01/07 0700 In: 983 [P.O.:480; I.V.:503] Out: 650 [Urine:650] Intake/Output from this shift:    Labs:  Recent Labs  01/12/15 0255 01/14/15 0304  WBC 10.9* 8.0  HGB 9.0* 9.0*  PLT 197 177   Estimated Creatinine Clearance: 80.9 mL/min (by C-G formula based on Cr of 0.84). No results for input(s): VANCOTROUGH, VANCOPEAK, VANCORANDOM, GENTTROUGH, GENTPEAK, GENTRANDOM, TOBRATROUGH, TOBRAPEAK, TOBRARND, AMIKACINPEAK, AMIKACINTROU, AMIKACIN in the last 72 hours.   Microbiology: Recent Results (from the past 720 hour(s))  Urine culture     Status: None   Collection Time: 12/31/14  4:31 PM  Result Value Ref Range Status   Specimen Description URINE, CLEAN CATCH  Final   Special Requests NONE  Final   Culture MULTIPLE SPECIES PRESENT, SUGGEST RECOLLECTION  Final   Report Status 01/01/2015 FINAL  Final  Culture, Urine     Status: None   Collection Time: 01/10/15  2:32 PM  Result Value Ref Range Status   Specimen Description URINE, RANDOM  Final   Special Requests NONE   Final   Culture   Final    60,000 COLONIES/ml CITROBACTER KOSERI 50,000 COLONIES/mL ESCHERICHIA COLI Confirmed Extended Spectrum Beta-Lactamase Producer (ESBL) Performed at [facility]    Report Status 01/13/2015 FINAL  Final   Organism ID, Bacteria CITROBACTER KOSERI  Final   Organism ID, Bacteria ESCHERICHIA COLI  Final      Susceptibility   Citrobacter koseri - MIC*    CEFAZOLIN <=4 SENSITIVE Sensitive     CEFTRIAXONE <=1 SENSITIVE Sensitive     CIPROFLOXACIN <=0.25 SENSITIVE Sensitive     GENTAMICIN <=1 SENSITIVE Sensitive     IMIPENEM <=0.25 SENSITIVE Sensitive     NITROFURANTOIN 64 INTERMEDIATE Intermediate     TRIMETH/SULFA <=20 SENSITIVE Sensitive     PIP/TAZO <=4 SENSITIVE Sensitive     * 60,000 COLONIES/ml CITROBACTER KOSERI   Escherichia coli - MIC*    AMPICILLIN >=32 RESISTANT Resistant     CEFAZOLIN >=64 RESISTANT Resistant     CEFTRIAXONE >=64 RESISTANT Resistant     CIPROFLOXACIN <=0.25 SENSITIVE Sensitive     GENTAMICIN <=1 SENSITIVE Sensitive     IMIPENEM <=0.25 SENSITIVE Sensitive     NITROFURANTOIN <=16 SENSITIVE Sensitive     TRIMETH/SULFA >=320 RESISTANT Resistant     AMPICILLIN/SULBACTAM >=32 RESISTANT Resistant     PIP/TAZO <=4 SENSITIVE Sensitive     * 50,000 COLONIES/mL ESCHERICHIA COLI  MRSA PCR Screening     Status: None   Collection Time: 01/11/15  2:46 PM  Result Value Ref Range Status   MRSA by PCR NEGATIVE NEGATIVE Final  Comment:        The GeneXpert MRSA Assay (FDA approved for NASAL specimens only), is one component of a comprehensive MRSA colonization surveillance program. It is not intended to diagnose MRSA infection nor to guide or monitor treatment for MRSA infections.   Culture, blood (routine x 2)     Status: None   Collection Time: 01/11/15 10:16 PM  Result Value Ref Range Status   Specimen Description BLOOD LEFT HAND  Final   Special Requests IN PEDIATRIC BOTTLE 3CC  Final   Culture  Setup Time   Final    GRAM  POSITIVE COCCI IN CLUSTERS AEROBIC BOTTLE ONLY CRITICAL RESULT CALLED TO, READ BACK BY AND VERIFIED WITH: Farrell Ours RN [id number] 01/12/15 A BROWNING    Culture   Final    STAPHYLOCOCCUS SPECIES (COAGULASE NEGATIVE) THE SIGNIFICANCE OF ISOLATING THIS ORGANISM FROM A SINGLE SET OF BLOOD CULTURES WHEN MULTIPLE SETS ARE DRAWN IS UNCERTAIN. PLEASE NOTIFY THE MICROBIOLOGY DEPARTMENT WITHIN ONE WEEK IF SPECIATION AND SENSITIVITIES ARE REQUIRED.    Report Status 01/14/2015 FINAL  Final  Culture, blood (routine x 2)     Status: None (Preliminary result)   Collection Time: 01/11/15 10:22 PM  Result Value Ref Range Status   Specimen Description BLOOD RIGHT ANTECUBITAL  Final   Special Requests IN PEDIATRIC BOTTLE 3CC  Final   Culture NO GROWTH 3 DAYS  Final   Report Status PENDING  Incomplete   Medical History: History reviewed. No pertinent past medical history.  Medications:  Scheduled:  . cholecalciferol  2,000 Units Oral Daily  . dexamethasone  4 mg Oral 4 times per day  . feeding supplement (GLUCERNA SHAKE)  237 mL Oral TID BM  . finasteride  5 mg Oral Daily  . fluticasone  1 spray Each Nare Daily  . insulin aspart  0-15 Units Subcutaneous TID WC  . irbesartan  300 mg Oral Daily  . levETIRAcetam  500 mg Oral BID  . multivitamin with minerals  1 tablet Oral Daily  . pantoprazole (PROTONIX) IV  40 mg Intravenous QHS  . risperiDONE  0.5 mg Oral QHS  . sertraline  25 mg Oral Daily  . sodium chloride  3 mL Intravenous Q12H  . tamsulosin  0.4 mg Oral Daily  . vitamin E  200 Units Oral Daily   Anti-infectives    Start     Dose/Rate Route Frequency Ordered Stop   01/12/15 1630  cefTRIAXone (ROCEPHIN) 1 g in dextrose 5 % 50 mL IVPB  Status:  Discontinued     1 g 100 mL/hr over 30 Minutes Intravenous Every 24 hours 01/12/15 1605 01/14/15 1049   01/11/15 1800  ceFAZolin (ANCEF) IVPB 2 g/50 mL premix     2 g 100 mL/hr over 30 Minutes Intravenous Every 8 hours 01/11/15 1440 01/12/15 0217   01/11/15  1025  bacitracin 50,000 Units in sodium chloride irrigation 0.9 % 500 mL irrigation  Status:  Discontinued       As needed 01/11/15 1025 01/11/15 1107     Assessment: 75 y.o. male with hypertension, diabetes, obstructive sleep apnea on CPAP, recently hospitalized for GI bleed who presented to [facility] long hospital with worsening confusion over past few days prior to this admission. 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 S/P stereotactic brain biopsy on 01/11/2015, tolerated the procedure well with no complications.  Urine  cx: Citrobacter (pansensitive), E coli ESBL producer  Change abx to Imipenem, d/c Ceftriaxone  Noted 1/2 blood cx with CoNS, possible contaminant  Imipenem can lower the seizure threshold, on Keppra currently s/p brain biopsy - no hx of seizures  Goal of Therapy:  Appropriate antibiotic/dose/schedule for treatment, renal function  Plan:   Primaxin 500mg  IV q8hr, using lower end of dosing range  Follow cx, renal function  Minda Ditto PharmD Pager [phone] 01/14/2015, 10:58 AM

## 2015-01-14 NOTE — Progress Notes (Signed)
ANTICOAGULATION CONSULT NOTE - F/u consult  Pharmacy Consult for heparin Indication: pulmonary embolus  Allergies  Allergen Reactions  . Strawberry Flavor Hives  . Penicillins Rash    Has patient had a PCN reaction causing immediate rash, facial/tongue/throat swelling, SOB or lightheadedness with hypotension: No Has patient had a PCN reaction causing severe rash involving mucus membranes or skin necrosis: No Has patient had a PCN reaction that required hospitalization No Has patient had a PCN reaction occurring within the last 10 years: No If all of the above answers are "NO", then may proceed with Cephalosporin use.    Patient Measurements: Height: 5\' 11"  (180.3 cm) Weight: 190 lb (86.183 kg) IBW/kg (Calculated) : 75.3  Vital Signs: Temp: 97.5 F (36.4 C) (01/07 1431) Temp Source: Axillary (01/07 1431) BP: 130/72 mmHg (01/07 1431) Pulse Rate: 73 (01/07 1431)  Labs:  Recent Labs  01/12/15 0255 01/13/15 1705 01/14/15 0304 01/14/15 1245  HGB 9.0*  --  9.0*  --   HCT 28.6*  --  28.5*  --   PLT 197  --  177  --   HEPARINUNFRC  --  0.14* 0.29* 0.36   Estimated Creatinine Clearance: 80.9 mL/min (by C-G formula based on Cr of 0.84).  Assessment: 75 y.o. male with past medical history significant for hypertension, diabetes, obstructive sleep apnea on CPAP, recently hospitalized for GI bleed who presented to [facility] long hospital because of worsening confusion over past few days prior to this admission.  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 S/P stereotactic brain biopsy on 01/11/2015. tolerated the procedure well with no complications.  Pharmacy consulted to continue heparin drip for PE.  Heparin infusion begun at 1300 units/hr, no bolus  01/13/15 1705 HL=0.14, no problems reported per RN- Heparin increased to  1400 units/hr 01/14/15 0304 HL = 0.29 units/ml, Heparin increased to 1500 units/hr 1245 HL = 0.36 units/ml, in range, no change, no problems noted. H/H low, stable, Plt wnl  Goal of Therapy:  Heparin level 0.3-0.5 units/ml Monitor platelets by anticoagulation protocol   Plan:   Continue Heparin infusion at 1500 units/hr  Daily Heparin level, CBC  Follow up daily  Minda Ditto PharmD Pager [phone] 01/14/2015, 3:14 PM

## 2015-01-14 NOTE — Progress Notes (Addendum)
[facility] PROGRESS NOTE  Aaron White ZOX:[id number] DOB: 1940/04/26 DOA: 01/04/2015 PCP: Horatio Pel, MD  HPI/Brief narrative  75 y.o. male with past medical history significant for hypertension, diabetes, obstructive sleep apnea on CPAP, recently hospitalized for GI bleed who now presented to [facility] hospital because of worsening confusion over past few days prior to this admission. Per patient's family, ever since discharge 12/26/2014 patient was not really himself. Patient would say things such as him being afraid of dying and that he is losing the mammary and he wanted to see his son in [place] but was worried as he has a fear of flying.   Patient was hemodynamically stable on the admission. Blood work was significant for hemoglobin of 8.1, otherwise unremarkable. He was admitted for further evaluation of acute encephalopathy. 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 Neurosurgery, radiation oncology and oncology consulted.   Assessment/Plan: Acute encephalopathy / Glioblastoma multiforme  - MRI brain with findings of tumor infiltrating the splenium of the corpus callosum and crossing bilaterally into the posterior medial temporal lobes - Neurosurgery, radiation oncology and oncology consulted. - Dr. Arnoldo Morale of neurosurgerywas consulted and patient is status post biopsy on 1/4 - S/P stereotactic brain biopsy on 01/11/2015. He tolerated the procedure well there were no complications.  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 Discussed case with Oncology on call for today. Per Oncologist, even with best treatments with radiation and surgery, prognosis is 9 mos, and without surgery probably 6 mos. Met with family at bedside who is  leaning away from aggressive treatments given concerns of poorer quality of life. Family has requested residential hospice, specifically [facility]. Have consulted Social Worker  PE/DVT:  -CT scan of lungs with IV contrast performed on 01/06/2015 showing small pulmonary embolus noted within a segmental branch of the left lower lobe. Bilateral lower extremity Dopplers performed on 01/08/2015 showing acute DVT involving left posterior tibial vein. -Started on heparin drip on admission, Heparin drip was stopped on 1/3 due to new onset of gross hematuria,  -IVC filter placed by IR on 1/3 at [place]  Gross hematuria/3.1 cm bladder vs prostate mass:  -Heparin drip stopped, ua/urine culture ordered on 1/3, consider abx if hematuria persists or fever, urology consulted -CT findings of prostate likely a benign etiology. Urology recommending cystoscopy in the outpatient setting. No further interventions during this hospitalization recommended  Anemia of chronic disease and iron deficiency anemia and blood loss anemia for hematuria, dose has h/o gi bleed from hemorrhoids/diverticula, stool occult blood this admission negative. - Recent GI bleed and colonoscopy 12/28/2014 with finding of hemorrhoids -- Aspirin placed on hold, Continue protonix daily  - Transfuse 1 unit PRBC on 12/30, 1uint on 1/3 -s/p iv iron , stool occult blood daily -transfuse prbc to keep hgb>7. -On 01/12/2015 labs show a hemoglobin of 9.0.  Frequent urination and prostate pain -Enlarged prostate on ct ab: ua unremarkable, psa 4.3 on 12/29 and 2.69 on 1/1 , started flomax, urology recommended outpatient cystoscopy when stable  Essential hypertension - bp remained stable thus far, held HCTZ due to poor oral intake, continue avapro -on gentle hydration due to poor oral intake  Controlled diabetes mellitus type 2 without complications without long-term insulin use - Continue SSI, held metformin - Our pharmacy does not  carry alogliptin-Pioglitazone  Depression/anxiety -  Continue risperidone 0.5 mg at bedtime, xanax q8hrs prn and qhs prn - Psych consulted 01/07/2015 , recommended trial of zoloft  DVT Prophylaxis  - SCD's , was on heparin drip, now s/p ivf filter  Code Status: Met with family. Family reports patient's wishes are now DNR/DNI Family Communication: Pt in room Disposition Plan: Unknown at this time   Consultants: Psychiatry Nerosurgery - Dr. Arnoldo Morale Oncology - Dr. Marin Olp Radiation oncology  Urology Dr. Louis Meckel  Procedures:  Stereotactic brain biopsy 01/11/2015  Antibiotics: Anti-infectives    Start     Dose/Rate Route Frequency Ordered Stop   01/14/15 1200  imipenem-cilastatin (PRIMAXIN) 500 mg in sodium chloride 0.9 % 100 mL IVPB     500 mg 200 mL/hr over 30 Minutes Intravenous Every 8 hours 01/14/15 1101     01/12/15 1630  cefTRIAXone (ROCEPHIN) 1 g in dextrose 5 % 50 mL IVPB  Status:  Discontinued     1 g 100 mL/hr over 30 Minutes Intravenous Every 24 hours 01/12/15 1605 01/14/15 1049   01/11/15 1800  ceFAZolin (ANCEF) IVPB 2 g/50 mL premix     2 g 100 mL/hr over 30 Minutes Intravenous Every 8 hours 01/11/15 1440 01/12/15 0217   01/11/15 1025  bacitracin 50,000 Units in sodium chloride irrigation 0.9 % 500 mL irrigation  Status:  Discontinued       As needed 01/11/15 1025 01/11/15 1107      HPI/Subjective: Pleasantly confused. Denies sob.  Objective: Filed Vitals:   01/13/15 1435 01/13/15 2045 01/14/15 0515 01/14/15 1431  BP: 145/69 143/81 137/74 130/72  Pulse: 71 59 59 73  Temp: 98.4 F (36.9 C) 98 F (36.7 C) 97.6 F (36.4 C) 97.5 F (36.4 C)  TempSrc: Oral Oral Axillary Axillary  Resp: _0 Height:      Weight:      SpO2: 98% 98% 97% 95%    Intake/Output Summary (Last 24 hours) at 01/14/15 1537 Last data filed at 01/14/15 1300  Gross per 24 hour  Intake    480 ml  Output   1150 ml  Net   -670 ml   Filed Weights   01/04/15 2059  01/12/15 1759  Weight: 95.255 kg (210 lb) 86.183 kg (190 lb)    Exam:   General:  Awake, in nad, eating lunch  Cardiovascular: regular, s1, s2  Respiratory: normal resp effort, no wheezing  Abdomen: soft,nondistended  Musculoskeletal: perfused, no clubbing, no cyanosis  Data Reviewed: Basic Metabolic Panel:  Recent Labs Lab 01/08/15 0506 01/09/15 0508 01/10/15 0050 01/11/15 0543  NA 140 143 142 143  K 3.6 3.7 3.7 4.0  CL 106 110 109 109  CO2 _1 GLUCOSE 179* 135* 217* 161*  BUN _2 CREATININE 0.80 0.92 0.88 0.84  CALCIUM 8.7* 8.8* 8.8* 8.9  MG 1.8 1.9  --   --    Liver Function Tests: No results for input(s): AST, ALT, ALKPHOS, BILITOT, PROT, ALBUMIN in the last 168 hours. No results for input(s): LIPASE, AMYLASE in the last 168 hours. No results for input(s): AMMONIA in the last 168 hours. CBC:  Recent Labs Lab 01/10/15 0050 01/10/15 1006 01/11/15 0543 01/12/15 0255 01/14/15 0304  WBC 10.0 9.8 11.9* 10.9* 8.0  HGB 8.4* 8.4* 9.5* 9.0* 9.0*  HCT 26.3* 26.2* 29.6* 28.6* 28.5*  MCV 91.3 89.4 89.2 91.1 92.2  PLT 285 277 232 197 177   Cardiac Enzymes: No results for input(s): CKTOTAL, CKMB, CKMBINDEX, TROPONINI  in the last 168 hours. BNP (last 3 results) No results for input(s): BNP in the last 8760 hours.  ProBNP (last 3 results) No results for input(s): PROBNP in the last 8760 hours.  CBG:  Recent Labs Lab 01/13/15 1158 01/13/15 1725 01/13/15 2054 01/14/15 0803 01/14/15 1203  GLUCAP 146* 249* 246* 169* 150*    Recent Results (from the past 240 hour(s))  Culture, Urine     Status: None   Collection Time: 01/10/15  2:32 PM  Result Value Ref Range Status   Specimen Description URINE, RANDOM  Final   Special Requests NONE  Final   Culture   Final    60,000 COLONIES/ml CITROBACTER KOSERI 50,000 COLONIES/mL ESCHERICHIA COLI Confirmed Extended Spectrum Beta-Lactamase Producer (ESBL) Performed at [facility]     Report Status 01/13/2015 FINAL  Final   Organism ID, Bacteria CITROBACTER KOSERI  Final   Organism ID, Bacteria ESCHERICHIA COLI  Final      Susceptibility   Citrobacter koseri - MIC*    CEFAZOLIN <=4 SENSITIVE Sensitive     CEFTRIAXONE <=1 SENSITIVE Sensitive     CIPROFLOXACIN <=0.25 SENSITIVE Sensitive     GENTAMICIN <=1 SENSITIVE Sensitive     IMIPENEM <=0.25 SENSITIVE Sensitive     NITROFURANTOIN 64 INTERMEDIATE Intermediate     TRIMETH/SULFA <=20 SENSITIVE Sensitive     PIP/TAZO <=4 SENSITIVE Sensitive     * 60,000 COLONIES/ml CITROBACTER KOSERI   Escherichia coli - MIC*    AMPICILLIN >=32 RESISTANT Resistant     CEFAZOLIN >=64 RESISTANT Resistant     CEFTRIAXONE >=64 RESISTANT Resistant     CIPROFLOXACIN <=0.25 SENSITIVE Sensitive     GENTAMICIN <=1 SENSITIVE Sensitive     IMIPENEM <=0.25 SENSITIVE Sensitive     NITROFURANTOIN <=16 SENSITIVE Sensitive     TRIMETH/SULFA >=320 RESISTANT Resistant     AMPICILLIN/SULBACTAM >=32 RESISTANT Resistant     PIP/TAZO <=4 SENSITIVE Sensitive     * 50,000 COLONIES/mL ESCHERICHIA COLI  MRSA PCR Screening     Status: None   Collection Time: 01/11/15  2:46 PM  Result Value Ref Range Status   MRSA by PCR NEGATIVE NEGATIVE Final    Comment:        The GeneXpert MRSA Assay (FDA approved for NASAL specimens only), is one component of a comprehensive MRSA colonization surveillance program. It is not intended to diagnose MRSA infection nor to guide or monitor treatment for MRSA infections.   Culture, blood (routine x 2)     Status: None   Collection Time: 01/11/15 10:16 PM  Result Value Ref Range Status   Specimen Description BLOOD LEFT HAND  Final   Special Requests IN PEDIATRIC BOTTLE 3CC  Final   Culture  Setup Time   Final    GRAM POSITIVE COCCI IN CLUSTERS AEROBIC BOTTLE ONLY CRITICAL RESULT CALLED TO, READ BACK BY AND VERIFIED WITH: Farrell Ours RN [id number] 01/12/15 A BROWNING    Culture   Final    STAPHYLOCOCCUS SPECIES (COAGULASE  NEGATIVE) THE SIGNIFICANCE OF ISOLATING THIS ORGANISM FROM A SINGLE SET OF BLOOD CULTURES WHEN MULTIPLE SETS ARE DRAWN IS UNCERTAIN. PLEASE NOTIFY THE MICROBIOLOGY DEPARTMENT WITHIN ONE WEEK IF SPECIATION AND SENSITIVITIES ARE REQUIRED.    Report Status 01/14/2015 FINAL  Final  Culture, blood (routine x 2)     Status: None (Preliminary result)   Collection Time: 01/11/15 10:22 PM  Result Value Ref Range Status   Specimen Description BLOOD RIGHT ANTECUBITAL  Final   Special Requests IN  PEDIATRIC BOTTLE 3CC  Final   Culture NO GROWTH 3 DAYS  Final   Report Status PENDING  Incomplete     Studies: No results found.  Scheduled Meds: . cholecalciferol  2,000 Units Oral Daily  . dexamethasone  4 mg Oral 4 times per day  . feeding supplement (GLUCERNA SHAKE)  237 mL Oral TID BM  . finasteride  5 mg Oral Daily  . fluticasone  1 spray Each Nare Daily  . imipenem-cilastatin  500 mg Intravenous Q8H  . insulin aspart  0-15 Units Subcutaneous TID WC  . irbesartan  300 mg Oral Daily  . levETIRAcetam  500 mg Oral BID  . multivitamin with minerals  1 tablet Oral Daily  . pantoprazole (PROTONIX) IV  40 mg Intravenous QHS  . risperiDONE  0.5 mg Oral QHS  . sertraline  25 mg Oral Daily  . sodium chloride  3 mL Intravenous Q12H  . tamsulosin  0.4 mg Oral Daily  . vitamin E  200 Units Oral Daily   Continuous Infusions: . 0.9 % NaCl with KCl 20 mEq / L 50 mL/hr at 01/14/15 1257  . heparin 1,500 Units/hr (01/14/15 0428)    Principal Problem:   Acute encephalopathy Active Problems:   Essential hypertension   GBM (glioblastoma multiforme) (HCC)   Anemia of chronic disease   Depression   Controlled type 2 diabetes mellitus without complication, without long-term current use of insulin (HCC)   Anxious mood   Confusion   Iron deficiency   Acute pulmonary embolism (HCC)   Mass, brain   Gross hematuria   Brain tumor ([place])   Gerlene Glassburn, [facility] Pager [phone]. If 7PM-7AM,  please contact night-coverage at [email or web address], password [facility] 01/14/2015, 3:37 PM  LOS: 10 days

## 2015-01-14 NOTE — Progress Notes (Signed)
ANTICOAGULATION CONSULT NOTE - Follow Up Consult  Pharmacy Consult for Heparin Indication: pulmonary embolus  Allergies  Allergen Reactions  . Strawberry Flavor Hives  . Penicillins Rash    Has patient had a PCN reaction causing immediate rash, facial/tongue/throat swelling, SOB or lightheadedness with hypotension: No Has patient had a PCN reaction causing severe rash involving mucus membranes or skin necrosis: No Has patient had a PCN reaction that required hospitalization No Has patient had a PCN reaction occurring within the last 10 years: No If all of the above answers are "NO", then may proceed with Cephalosporin use.     Patient Measurements: Height: 5\' 11"  (180.3 cm) Weight: 190 lb (86.183 kg) IBW/kg (Calculated) : 75.3 Heparin Dosing Weight:   Vital Signs: Temp: 98 F (36.7 C) (01/06 2045) Temp Source: Oral (01/06 2045) BP: 143/81 mmHg (01/06 2045) Pulse Rate: 59 (01/06 2045)  Labs:  Recent Labs  01/12/15 0255 01/13/15 1705 01/14/15 0304  HGB 9.0*  --  9.0*  HCT 28.6*  --  28.5*  PLT 197  --  177  HEPARINUNFRC  --  0.14* 0.29*    Estimated Creatinine Clearance: 80.9 mL/min (by C-G formula based on Cr of 0.84).   Medications:  Infusions:  . 0.9 % NaCl with KCl 20 mEq / L 1,000 mL (01/13/15 1510)  . heparin 1,500 Units/hr (01/14/15 0428)    Assessment: Patient with slightly low heparin level.  No heparin issues per RN.  Goal of Therapy:  Heparin level 0.3-0.7 units/ml Monitor platelets by anticoagulation protocol: Yes   Plan:  Increase heparin to 1500 units/hr Recheck level at [address], [place] 01/14/2015,5:53 AM

## 2015-01-15 LAB — CBC
HCT: 31.3 % — ABNORMAL LOW (ref 39.0–52.0)
HEMOGLOBIN: 9.9 g/dL — AB (ref 13.0–17.0)
MCH: 29.3 pg (ref 26.0–34.0)
MCHC: 31.6 g/dL (ref 30.0–36.0)
MCV: 92.6 fL (ref 78.0–100.0)
PLATELETS: 250 10*3/uL (ref 150–400)
RBC: 3.38 MIL/uL — AB (ref 4.22–5.81)
RDW: 15.4 % (ref 11.5–15.5)
WBC: 12.2 10*3/uL — ABNORMAL HIGH (ref 4.0–10.5)

## 2015-01-15 LAB — GLUCOSE, CAPILLARY
GLUCOSE-CAPILLARY: 241 mg/dL — AB (ref 65–99)
GLUCOSE-CAPILLARY: 287 mg/dL — AB (ref 65–99)
Glucose-Capillary: 191 mg/dL — ABNORMAL HIGH (ref 65–99)
Glucose-Capillary: 245 mg/dL — ABNORMAL HIGH (ref 65–99)

## 2015-01-15 LAB — HEPARIN LEVEL (UNFRACTIONATED): HEPARIN UNFRACTIONATED: 0.46 [IU]/mL (ref 0.30–0.70)

## 2015-01-15 MED ORDER — DEXAMETHASONE 4 MG PO TABS
4.0000 mg | ORAL_TABLET | Freq: Two times a day (BID) | ORAL | Status: DC
  Administered 2015-01-15 – 2015-01-16 (×2): 4 mg via ORAL
  Filled 2015-01-15 (×2): qty 1

## 2015-01-15 NOTE — Progress Notes (Signed)
[facility] PROGRESS NOTE  Aaron White SKA:[id number] DOB: Dec 28, 1940 DOA: 01/04/2015 PCP: Horatio Pel, MD  HPI/Brief narrative  75 y.o. male with past medical history significant for hypertension, diabetes, obstructive sleep apnea on CPAP, recently hospitalized for GI bleed who now presented to [facility] long hospital because of worsening confusion over past few days prior to this admission. Per patient's family, ever since discharge 12/26/2014 patient was not really himself. Patient would say things such as him being afraid of dying and that he is losing the mammary and he wanted to see his son in [place] but was worried as he has a fear of flying.   Patient was hemodynamically stable on the admission. Blood work was significant for hemoglobin of 8.1, otherwise unremarkable. He was admitted for further evaluation of acute encephalopathy. 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 Neurosurgery, radiation oncology and oncology consulted.   Assessment/Plan: Acute encephalopathy / Glioblastoma multiforme  - MRI brain with findings of tumor infiltrating the splenium of the corpus callosum and crossing bilaterally into the posterior medial temporal lobes - Neurosurgery, radiation oncology and oncology consulted. - Dr. Arnoldo Morale of neurosurgerywas consulted and patient is status post biopsy on 1/4 - S/P stereotactic brain biopsy on 01/11/2015. He tolerated the procedure well there were no complications.  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 On 1/7, had discussed case with Oncology on call. Per Oncologist, even with best treatments with radiation and surgery, prognosis is 9 mos, and without surgery probably 6 mos. Met with family at bedside who is  leaning away from aggressive treatments given concerns of poorer quality of life. Family has requested residential hospice, specifically [facility]. Have consulted Social Worker - Pt noted to be confused today. Will give trial of decreasing steroid dose from q6hrs to q12hrs  PE/DVT:  -CT scan of lungs with IV contrast performed on 01/06/2015 showing small pulmonary embolus noted within a segmental branch of the left lower lobe. Bilateral lower extremity Dopplers performed on 01/08/2015 showing acute DVT involving left posterior tibial vein. -Started on heparin drip on admission, Heparin drip was stopped on 1/3 due to new onset of gross hematuria,  -IVC filter placed by IR on 1/3 at [facility]  Gross hematuria/3.1 cm bladder vs prostate mass:  -Heparin drip stopped, ua/urine culture ordered on 1/3, consider abx if hematuria persists or fever, urology consulted -CT findings of prostate likely a benign etiology. Urology recommending cystoscopy in the outpatient setting. No further interventions during this hospitalization recommended  Anemia of chronic disease and iron deficiency anemia and blood loss anemia for hematuria, dose has h/o gi bleed from hemorrhoids/diverticula, stool occult blood this admission negative. - Recent GI bleed and colonoscopy 12/28/2014 with finding of hemorrhoids -- Aspirin placed on hold, Continue protonix daily  - Transfuse 1 unit PRBC on 12/30, 1uint on 1/3 -s/p iv iron , stool occult blood daily -transfuse prbc to keep hgb>7. -On 01/12/2015 labs show a hemoglobin of 9.0.  Frequent urination and prostate pain -Enlarged prostate on ct ab: ua unremarkable, psa 4.3 on 12/29 and 2.69 on 1/1 , started flomax, urology recommended outpatient cystoscopy when stable  Essential hypertension - bp remained stable thus far, held HCTZ due to poor oral intake, continue avapro -on gentle hydration due to poor oral intake  Controlled diabetes mellitus type 2 without  complications without long-term insulin use - Continue SSI, held metformin - Our pharmacy does not carry alogliptin-Pioglitazone  Depression/anxiety - Continue risperidone 0.5 mg at bedtime, xanax q8hrs prn and qhs prn - Psych consulted 01/07/2015 , recommended trial of zoloft  DVT Prophylaxis  - SCD's , was on heparin drip, now s/p ivf filter  Code Status: Patient's wishes are DNR/DNI Family Communication: Pt in room Disposition Plan: Unclear at this time, possible hospice?   Consultants: Psychiatry Nerosurgery - Dr. Arnoldo Morale Oncology - Dr. Marin Olp Radiation oncology  Urology Dr. Louis Meckel  Procedures:  Stereotactic brain biopsy 01/11/2015  Antibiotics: Anti-infectives    Start     Dose/Rate Route Frequency Ordered Stop   01/14/15 1200  imipenem-cilastatin (PRIMAXIN) 500 mg in sodium chloride 0.9 % 100 mL IVPB     500 mg 200 mL/hr over 30 Minutes Intravenous Every 8 hours 01/14/15 1101     01/12/15 1630  cefTRIAXone (ROCEPHIN) 1 g in dextrose 5 % 50 mL IVPB  Status:  Discontinued     1 g 100 mL/hr over 30 Minutes Intravenous Every 24 hours 01/12/15 1605 01/14/15 1049   01/11/15 1800  ceFAZolin (ANCEF) IVPB 2 g/50 mL premix     2 g 100 mL/hr over 30 Minutes Intravenous Every 8 hours 01/11/15 1440 01/12/15 0217   01/11/15 1025  bacitracin 50,000 Units in sodium chloride irrigation 0.9 % 500 mL irrigation  Status:  Discontinued       As needed 01/11/15 1025 01/11/15 1107      HPI/Subjective: Remains pleasantly confused..  Objective: Filed Vitals:   01/14/15 2142 01/15/15 0010 01/15/15 0526 01/15/15 1500  BP: 132/69  137/68 137/55  Pulse: 73  62 68  Temp: 97.7 F (36.5 C)  97.9 F (36.6 C) 98.9 F (37.2 C)  TempSrc: Oral  Oral Oral  Resp: '20 18 20 18  '$ Height:      Weight:      SpO2: 95%  96% 97%    Intake/Output Summary (Last 24 hours) at 01/15/15 1734 Last data filed at 01/15/15 1700  Gross per 24 hour  Intake   1410 ml  Output   1650 ml  Net   -240  ml   Filed Weights   01/04/15 2059 01/12/15 1759  Weight: 95.255 kg (210 lb) 86.183 kg (190 lb)    Exam:   General:  Awake, in nad, laying in bed  Cardiovascular: regular, s1, s2  Respiratory: normal resp effort, no wheezing  Abdomen: soft,nondistended  Musculoskeletal: perfused, no cyanosis  Data Reviewed: Basic Metabolic Panel:  Recent Labs Lab 01/09/15 0508 01/10/15 0050 01/11/15 0543  NA 143 142 143  K 3.7 3.7 4.0  CL 110 109 109  CO2 '26 25 25  '$ GLUCOSE 135* 217* 161*  BUN '13 13 7  '$ CREATININE 0.92 0.88 0.84  CALCIUM 8.8* 8.8* 8.9  MG 1.9  --   --    Liver Function Tests: No results for input(s): AST, ALT, ALKPHOS, BILITOT, PROT, ALBUMIN in the last 168 hours. No results for input(s): LIPASE, AMYLASE in the last 168 hours. No results for input(s): AMMONIA in the last 168 hours. CBC:  Recent Labs Lab 01/10/15 1006 01/11/15 0543 01/12/15 0255 01/14/15 0304 01/15/15 0414  WBC 9.8 11.9* 10.9* 8.0 12.2*  HGB 8.4* 9.5* 9.0* 9.0* 9.9*  HCT 26.2* 29.6* 28.6* 28.5* 31.3*  MCV 89.4 89.2 91.1 92.2 92.6  PLT 277 232 197 177 250   Cardiac Enzymes: No results for input(s): CKTOTAL, CKMB, CKMBINDEX, TROPONINI in  the last 168 hours. BNP (last 3 results) No results for input(s): BNP in the last 8760 hours.  ProBNP (last 3 results) No results for input(s): PROBNP in the last 8760 hours.  CBG:  Recent Labs Lab 01/14/15 1647 01/14/15 2128 01/15/15 0807 01/15/15 1150 01/15/15 1701  GLUCAP 290* 288* 191* 245* 241*    Recent Results (from the past 240 hour(s))  Culture, Urine     Status: None   Collection Time: 01/10/15  2:32 PM  Result Value Ref Range Status   Specimen Description URINE, RANDOM  Final   Special Requests NONE  Final   Culture   Final    60,000 COLONIES/ml CITROBACTER KOSERI 50,000 COLONIES/mL ESCHERICHIA COLI Confirmed Extended Spectrum Beta-Lactamase Producer (ESBL) Performed at [facility]    Report Status 01/13/2015 FINAL   Final   Organism ID, Bacteria CITROBACTER KOSERI  Final   Organism ID, Bacteria ESCHERICHIA COLI  Final      Susceptibility   Citrobacter koseri - MIC*    CEFAZOLIN <=4 SENSITIVE Sensitive     CEFTRIAXONE <=1 SENSITIVE Sensitive     CIPROFLOXACIN <=0.25 SENSITIVE Sensitive     GENTAMICIN <=1 SENSITIVE Sensitive     IMIPENEM <=0.25 SENSITIVE Sensitive     NITROFURANTOIN 64 INTERMEDIATE Intermediate     TRIMETH/SULFA <=20 SENSITIVE Sensitive     PIP/TAZO <=4 SENSITIVE Sensitive     * 60,000 COLONIES/ml CITROBACTER KOSERI   Escherichia coli - MIC*    AMPICILLIN >=32 RESISTANT Resistant     CEFAZOLIN >=64 RESISTANT Resistant     CEFTRIAXONE >=64 RESISTANT Resistant     CIPROFLOXACIN <=0.25 SENSITIVE Sensitive     GENTAMICIN <=1 SENSITIVE Sensitive     IMIPENEM <=0.25 SENSITIVE Sensitive     NITROFURANTOIN <=16 SENSITIVE Sensitive     TRIMETH/SULFA >=320 RESISTANT Resistant     AMPICILLIN/SULBACTAM >=32 RESISTANT Resistant     PIP/TAZO <=4 SENSITIVE Sensitive     * 50,000 COLONIES/mL ESCHERICHIA COLI  MRSA PCR Screening     Status: None   Collection Time: 01/11/15  2:46 PM  Result Value Ref Range Status   MRSA by PCR NEGATIVE NEGATIVE Final    Comment:        The GeneXpert MRSA Assay (FDA approved for NASAL specimens only), is one component of a comprehensive MRSA colonization surveillance program. It is not intended to diagnose MRSA infection nor to guide or monitor treatment for MRSA infections.   Culture, blood (routine x 2)     Status: None   Collection Time: 01/11/15 10:16 PM  Result Value Ref Range Status   Specimen Description BLOOD LEFT HAND  Final   Special Requests IN PEDIATRIC BOTTLE 3CC  Final   Culture  Setup Time   Final    GRAM POSITIVE COCCI IN CLUSTERS AEROBIC BOTTLE ONLY CRITICAL RESULT CALLED TO, READ BACK BY AND VERIFIED WITH: Farrell Ours RN [id number] 01/12/15 A BROWNING    Culture   Final    STAPHYLOCOCCUS SPECIES (COAGULASE NEGATIVE) THE SIGNIFICANCE OF  ISOLATING THIS ORGANISM FROM A SINGLE SET OF BLOOD CULTURES WHEN MULTIPLE SETS ARE DRAWN IS UNCERTAIN. PLEASE NOTIFY THE MICROBIOLOGY DEPARTMENT WITHIN ONE WEEK IF SPECIATION AND SENSITIVITIES ARE REQUIRED.    Report Status 01/14/2015 FINAL  Final  Culture, blood (routine x 2)     Status: None (Preliminary result)   Collection Time: 01/11/15 10:22 PM  Result Value Ref Range Status   Specimen Description BLOOD RIGHT ANTECUBITAL  Final   Special Requests IN PEDIATRIC  BOTTLE 3CC  Final   Culture NO GROWTH 4 DAYS  Final   Report Status PENDING  Incomplete     Studies: No results found.  Scheduled Meds: . cholecalciferol  2,000 Units Oral Daily  . dexamethasone  4 mg Oral Q12H  . feeding supplement (GLUCERNA SHAKE)  237 mL Oral TID BM  . finasteride  5 mg Oral Daily  . fluticasone  1 spray Each Nare Daily  . imipenem-cilastatin  500 mg Intravenous Q8H  . insulin aspart  0-15 Units Subcutaneous TID WC  . irbesartan  300 mg Oral Daily  . levETIRAcetam  500 mg Oral BID  . multivitamin with minerals  1 tablet Oral Daily  . pantoprazole (PROTONIX) IV  40 mg Intravenous QHS  . risperiDONE  0.5 mg Oral QHS  . sertraline  25 mg Oral Daily  . sodium chloride  3 mL Intravenous Q12H  . tamsulosin  0.4 mg Oral Daily  . vitamin E  200 Units Oral Daily   Continuous Infusions: . 0.9 % NaCl with KCl 20 mEq / L 50 mL/hr at 01/15/15 1219  . heparin 1,500 Units/hr (01/15/15 1726)    Principal Problem:   Acute encephalopathy Active Problems:   Essential hypertension   GBM (glioblastoma multiforme) (HCC)   Anemia of chronic disease   Depression   Controlled type 2 diabetes mellitus without complication, without long-term current use of insulin (HCC)   Anxious mood   Confusion   Iron deficiency   Acute pulmonary embolism (HCC)   Mass, brain   Gross hematuria   Brain tumor ([place])   Clemente Dewey, [facility] Pager [phone]. If 7PM-7AM, please contact night-coverage at  [email or web address], password [facility] 01/15/2015, 5:34 PM  LOS: 11 days

## 2015-01-15 NOTE — Progress Notes (Signed)
ANTICOAGULATION CONSULT NOTE - Follow Up Consult  Pharmacy Consult for Heparin Indication: pulmonary embolus  Allergies  Allergen Reactions  . Strawberry Flavor Hives  . Penicillins Rash    Has patient had a PCN reaction causing immediate rash, facial/tongue/throat swelling, SOB or lightheadedness with hypotension: No Has patient had a PCN reaction causing severe rash involving mucus membranes or skin necrosis: No Has patient had a PCN reaction that required hospitalization No Has patient had a PCN reaction occurring within the last 10 years: No If all of the above answers are "NO", then may proceed with Cephalosporin use.     Patient Measurements: Height: 5\' 11"  (180.3 cm) Weight: 190 lb (86.183 kg) IBW/kg (Calculated) : 75.3 Heparin Dosing Weight:   Vital Signs: Temp: 97.9 F (36.6 C) (01/08 0526) Temp Source: Oral (01/08 0526) BP: 137/68 mmHg (01/08 0526) Pulse Rate: 62 (01/08 0526)  Labs:  Recent Labs  01/14/15 0304 01/14/15 1245 01/15/15 0414  HGB 9.0*  --  9.9*  HCT 28.5*  --  31.3*  PLT 177  --  250  HEPARINUNFRC 0.29* 0.36 0.46    Estimated Creatinine Clearance: 80.9 mL/min (by C-G formula based on Cr of 0.84).   Medications:  Infusions:  . 0.9 % NaCl with KCl 20 mEq / L 50 mL/hr at 01/14/15 1257  . heparin 1,500 Units/hr (01/14/15 0428)    Assessment: Patient with heparin level at goal x2.  No heparin issues noted.  Goal of Therapy:  Heparin level 0.3-0.5 units/ml Monitor platelets by anticoagulation protocol: Yes   Plan:  Continue heparin drip at current rate Recheck level with 1/9 AM labs  Tyler Deis, Shea Stakes [place] 01/15/2015,6:36 AM

## 2015-01-15 NOTE — Progress Notes (Signed)
Rt placed pt CPAP on with 2LPM bleed in.

## 2015-01-16 LAB — CULTURE, BLOOD (ROUTINE X 2): Culture: NO GROWTH

## 2015-01-16 LAB — BASIC METABOLIC PANEL
ANION GAP: 7 (ref 5–15)
BUN: 13 mg/dL (ref 6–20)
CO2: 28 mmol/L (ref 22–32)
Calcium: 8.7 mg/dL — ABNORMAL LOW (ref 8.9–10.3)
Chloride: 106 mmol/L (ref 101–111)
Creatinine, Ser: 0.85 mg/dL (ref 0.61–1.24)
GFR calc Af Amer: >60 mL/min (ref >60)
GLUCOSE: 260 mg/dL — AB (ref 65–99)
POTASSIUM: 4.1 mmol/L (ref 3.5–5.1)
Sodium: 141 mmol/L (ref 135–145)

## 2015-01-16 LAB — GLUCOSE, CAPILLARY
GLUCOSE-CAPILLARY: 236 mg/dL — AB (ref 65–99)
Glucose-Capillary: 234 mg/dL — ABNORMAL HIGH (ref 65–99)
Glucose-Capillary: 221 mg/dL — ABNORMAL HIGH (ref 65–99)

## 2015-01-16 LAB — CBC
HCT: 31.6 % — ABNORMAL LOW (ref 39.0–52.0)
HEMOGLOBIN: 9.9 g/dL — AB (ref 13.0–17.0)
MCH: 29.1 pg (ref 26.0–34.0)
MCHC: 31.3 g/dL (ref 30.0–36.0)
MCV: 92.9 fL (ref 78.0–100.0)
Platelets: 232 10*3/uL (ref 150–400)
RBC: 3.4 MIL/uL — AB (ref 4.22–5.81)
RDW: 15.5 % (ref 11.5–15.5)
WBC: 10.6 10*3/uL — ABNORMAL HIGH (ref 4.0–10.5)

## 2015-01-16 LAB — HEPARIN LEVEL (UNFRACTIONATED): HEPARIN UNFRACTIONATED: 0.49 [IU]/mL (ref 0.30–0.70)

## 2015-01-16 MED ORDER — ENOXAPARIN SODIUM 150 MG/ML ~~LOC~~ SOLN
130.0000 mg | SUBCUTANEOUS | Status: DC
  Administered 2015-01-16 – 2015-01-17 (×2): 130 mg via SUBCUTANEOUS
  Filled 2015-01-16 (×2): qty 0.87

## 2015-01-16 MED ORDER — INSULIN GLARGINE 100 UNIT/ML ~~LOC~~ SOLN
5.0000 [IU] | Freq: Every day | SUBCUTANEOUS | Status: DC
  Administered 2015-01-16 – 2015-01-17 (×2): 5 [IU] via SUBCUTANEOUS
  Filled 2015-01-16 (×3): qty 0.05

## 2015-01-16 MED ORDER — INSULIN ASPART 100 UNIT/ML ~~LOC~~ SOLN
0.0000 [IU] | Freq: Three times a day (TID) | SUBCUTANEOUS | Status: DC
  Administered 2015-01-16 (×3): 7 [IU] via SUBCUTANEOUS
  Administered 2015-01-17: 4 [IU] via SUBCUTANEOUS
  Administered 2015-01-17: 7 [IU] via SUBCUTANEOUS

## 2015-01-16 NOTE — Progress Notes (Signed)
RT placed patient on CPAP. Patient setting is 10 cmH2O. Sterile water added to water chamber for humidification. 2 liters oxygen bleed into tubing. Patient is tolerating well. RT will continue to monitor.

## 2015-01-16 NOTE — Progress Notes (Signed)
Thank you for consulting the Palliative Medicine Team at [facility] to meet your patient's and family's needs.   The reason that you asked [place] to see your patient is: Establishing GOC  We have scheduled your patient for a meeting:  01-16-14 at 1200 with wife and friend  Wadie Lessen NP  Palliative Medicine Team Team Phone # [phone] Pager [phone]

## 2015-01-16 NOTE — Clinical Social Work Placement (Signed)
   CLINICAL SOCIAL WORK PLACEMENT  NOTE  Date:  01/16/2015  Patient Details  Name: XAVIOUS SELLARDS MRN: [id number] Date of Birth: [birth year]  Clinical Social Work is seeking post-discharge placement for this patient at the [place] level of care (*CSW will initial, date and re-position this form in  chart as items are completed):  Yes   Patient/family provided with [place] Work Department's list of facilities offering this level of care within the geographic area requested by the patient (or if unable, by the patient's family).  Yes   Patient/family informed of their freedom to choose among providers that offer the needed level of care, that participate in Medicare, Medicaid or managed care program needed by the patient, have an available bed and are willing to accept the patient.  Yes   Patient/family informed of [facility]'s ownership interest in [facility] and [facility], as well as of the fact that they are under no obligation to receive care at these facilities.  PASRR submitted to EDS on 01/16/15     PASRR number received on 01/16/15     Existing PASRR number confirmed on       FL2 transmitted to all facilities in geographic area requested by pt/family on 01/16/15     FL2 transmitted to all facilities within larger geographic area on       Patient informed that his/her managed care company has contracts with or will negotiate with certain facilities, including the following:            Patient/family informed of bed offers received.  Patient chooses bed at       Physician recommends and patient chooses bed at      Patient to be transferred to   on  .  Patient to be transferred to facility by       Patient family notified on   of transfer.  Name of family member notified:        PHYSICIAN Please sign FL2, Please sign DNR     Additional Comment:    _______________________________________________ Ladell Pier,  LCSW 01/16/2015, 3:16 PM

## 2015-01-16 NOTE — Progress Notes (Signed)
Physical Therapy Treatment Patient Details Name: Aaron White MRN: [id number] DOB: 07/28/1940 Today's Date: 01/16/2015    History of Present Illness 75 yo male admitted with AMS. Hx of HTN,DM, sleep apnea, bil knee OA. Dx: brain tumor    PT Comments    Poor tolerance to activity due to dizziness in sitting and standing. BP sitting 138/74, standing 107/60. Assisted pt to transfer from bed to recliner. Unable to tolerate ambulation due to significant dizziness in standing. Pt fatigued and wanted to nap after transfer.   Follow Up Recommendations  Supervision/Assistance - 24 hour;SNF     Equipment Recommendations  Rolling walker with 5" wheels;Wheelchair (measurements PT)    Recommendations for Other Services       Precautions / Restrictions Precautions Precautions: Fall Restrictions Weight Bearing Restrictions: No    Mobility  Bed Mobility Overal bed mobility: Needs Assistance Bed Mobility: Supine to Sit     Supine to sit: Min assist     General bed mobility comments: min A to raise trunk, HOB up 25*  Transfers Overall transfer level: Needs assistance Equipment used: Rolling walker (2 wheeled) Transfers: Sit to/from [facility] Sit to Stand: From elevated surface;Mod assist Stand pivot transfers: Min assist       General transfer comment: mod A to rise and steady, dizzy upon standing so did not ambulate, assisted pt to pivot to recliner; increased time for SPT. Pt orthostatic, sitting BP 138/74, standing 107/60  Ambulation/Gait     Assistive device:  (IV pole)       General Gait Details: VCs posture. slow gait speed. noted shorter step length bilaterally on today. small amoun of assist to stabilize intemittently.    Stairs            Wheelchair Mobility    Modified Rankin (Stroke Patients Only)       Balance     Sitting balance-Leahy Scale: Good       Standing balance-Leahy Scale: Poor                       Cognition Arousal/Alertness: Awake/alert Behavior During Therapy: WFL for tasks assessed/performed Overall Cognitive Status: Within Functional Limits for tasks assessed                      Exercises      General Comments        Pertinent Vitals/Pain Pain Assessment: No/denies pain    Home Living                      Prior Function            PT Goals (current goals can now be found in the care plan section) Acute Rehab PT Goals Patient Stated Goal: to get TKA's done in Summer 2017. PT Goal Formulation: With patient Time For Goal Achievement: 01/20/15 Potential to Achieve Goals: Fair Progress towards PT goals: Not progressing toward goals - comment (dizziness limiting activity tolerance)    Frequency  Min 3X/week    PT Plan Current plan remains appropriate    Co-evaluation             End of Session Equipment Utilized During Treatment: Gait belt Activity Tolerance: Patient tolerated treatment well Patient left: with call bell/phone within reach;in chair;with chair alarm set     Time: 1323-1340 PT Time Calculation (min) (ACUTE ONLY): 17 min  Charges:  $Therapeutic Activity: 8-22 mins  G Codes:      Blondell Reveal Kistler 01/16/2015, 1:46 PM [phone]

## 2015-01-16 NOTE — Progress Notes (Addendum)
ANTICOAGULATION CONSULT NOTE - F/u consult  Pharmacy Consult for heparin Indication: new pulmonary embolus, DVT  Allergies  Allergen Reactions  . Strawberry Flavor Hives  . Penicillins Rash    Has patient had a PCN reaction causing immediate rash, facial/tongue/throat swelling, SOB or lightheadedness with hypotension: No Has patient had a PCN reaction causing severe rash involving mucus membranes or skin necrosis: No Has patient had a PCN reaction that required hospitalization No Has patient had a PCN reaction occurring within the last 10 years: No If all of the above answers are "NO", then may proceed with Cephalosporin use.    Patient Measurements: Height: 5\' 11"  (180.3 cm) Weight: 190 lb (86.183 kg) IBW/kg (Calculated) : 75.3  Vital Signs: Temp: 97.6 F (36.4 C) (01/09 0620) Temp Source: Oral (01/09 0620) BP: 163/72 mmHg (01/09 0620) Pulse Rate: 60 (01/09 0620)  Labs:  Recent Labs  01/14/15 0304 01/14/15 1245 01/15/15 0414 01/16/15 0350  HGB 9.0*  --  9.9* 9.9*  HCT 28.5*  --  31.3* 31.6*  PLT 177  --  250 232  HEPARINUNFRC 0.29* 0.36 0.46 0.49  CREATININE  --   --   --  0.85   Estimated Creatinine Clearance: 80 mL/min (by C-G formula based on Cr of 0.85).  Assessment: 75 y.o. male with past medical history significant for hypertension, diabetes, obstructive sleep apnea on CPAP, recently hospitalized for GI bleed who presented to [facility] long hospital because of worsening confusion over past few days prior to this admission.  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 S/P stereotactic brain biopsy on 01/11/2015-- tolerated the procedure well with no complications. S/p IVC filter placement on 1/3. Pharmacy consulted to continue heparin drip for new PE/DVT.   Today, 01/16/2015: - heparin level remains  therapeutic at 0.49 this morning - CBC remains stable - no bleeding documented   Goal of Therapy:  Heparin level 0.3-0.5 units/ml (lower end of therapeutic range d/t recent hx hematuria, GIB, and brain bx) Monitor platelets by anticoagulation protocol   Plan:   Continue Heparin infusion at 1500 units/hr  Daily Heparin level, CBC  Monitor for s/s bleeding  Dia Sitter, PharmD, BCPS 01/16/2015 8:05 AM   Adden: To transition patient from heparin to lovenox today per MD's request.  Will d/c heparin drip and start lovenox 130 mg SQ q24h (~1.5 mg/kg/day--  Dr. Marin Olp is ok with using this dosing regimen) one hour after heparin drip has been turned off. Change cbc monitoring to q72h Dia Sitter, PharmD, BCPS 01/16/2015 10:48 AM

## 2015-01-16 NOTE — Progress Notes (Signed)
CSW continuing to follow.   CSW reviewed chart and noted that oncologist recommending SNF upon discharge.  CSW visited pt room and pt wife not present at this time. CSW spoke with pt wife via telephone. CSW introduced self and explained role. Pt wife agreeable to explore SNF options. Pt wife provided CSW with information that pt insurance changed at the beginning of the year to [facility] Option PPO. Pt was employed full time prior to admission at [employer] per pt wife. Pt wife reports that pt family plans to meet with palliative care team tomorrow at 12 pm. Pt wife agreeable to CSW initiating SNF search.  CSW completed FL2 and initiated SNF search to [facility].   CSW to follow up with pt and pt wife tomorrow and follow up on recommendations from [facility].   Pt insurance [employer] PPO requires insurance authorization prior to pt discharge to SNF.  CSW to continue to follow.  Alison Murray, MSW, [place] Work [phone]

## 2015-01-16 NOTE — Progress Notes (Signed)
[facility] PROGRESS NOTE  Aaron White ZDG:[id number] DOB: 1940/07/15 DOA: 01/04/2015 PCP: Horatio Pel, MD  HPI/Brief narrative  75 y.o. male with past medical history significant for hypertension, diabetes, obstructive sleep apnea on CPAP, recently hospitalized for GI bleed who now presented to [facility] because of worsening confusion over past few days prior to this admission. Per patient's family, ever since discharge 12/26/2014 patient was not really himself. Patient would say things such as him being afraid of dying and that he is losing the mammary and he wanted to see his son in [place] but was worried as he has a fear of flying.   Patient was hemodynamically stable on the admission. Blood work was significant for hemoglobin of 8.1, otherwise unremarkable. He was admitted for further evaluation of acute encephalopathy. 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 Neurosurgery, radiation oncology and oncology consulted.   Assessment/Plan: Acute encephalopathy / Glioblastoma multiforme  - MRI brain with findings of tumor infiltrating the splenium of the corpus callosum and crossing bilaterally into the posterior medial temporal lobes - Neurosurgery, radiation oncology and oncology consulted. - Dr. Arnoldo Morale of neurosurgerywas consulted and patient is status post biopsy on 1/4 - S/P stereotactic brain biopsy on 01/11/2015. He tolerated the procedure well there were no complications.  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 On 1/7, had discussed case with Oncology on call. Per Oncologist, even with best treatments with radiation and surgery, prognosis is 9 mos, and without surgery probably 6 mos. Met with family at bedside who is  leaning away from aggressive treatments given concerns of poorer quality of life. Family has requested residential hospice, specifically [facility]. Have consulted Social Worker - Input from Dr. Marin Olp noted and appreciated. Decadron stopped secondary to confusion.  - Palliative Care consulted and plans to meet with family 1/10  PE/DVT:  -CT scan of lungs with IV contrast performed on 01/06/2015 showing small pulmonary embolus noted within a segmental branch of the left lower lobe. Bilateral lower extremity Dopplers performed on 01/08/2015 showing acute DVT involving left posterior tibial vein. -Started on heparin drip on admission, Heparin drip was stopped on 1/3 due to new onset of gross hematuria, now on lovenox -IVC filter placed by IR on 1/3 at [place]  Gross hematuria/3.1 cm bladder vs prostate mass:  -Heparin drip stopped, ua/urine culture ordered on 1/3, consider abx if hematuria persists or fever, urology consulted -CT findings of prostate likely a benign etiology. Urology recommending cystoscopy in the outpatient setting. No further interventions during this hospitalization recommended  Anemia of chronic disease and iron deficiency anemia and blood loss anemia for hematuria, dose has h/o gi bleed from hemorrhoids/diverticula, stool occult blood this admission negative. - Recent GI bleed and colonoscopy 12/28/2014 with finding of hemorrhoids -- Aspirin placed on hold, Continue protonix daily  - Transfuse 1 unit PRBC on 12/30, 1uint on 1/3 -s/p iv iron , stool occult blood daily -transfuse prbc to keep hgb>7  Frequent urination and prostate pain -Enlarged prostate on ct ab: ua unremarkable, psa 4.3 on 12/29 and 2.69 on 1/1 , started flomax, urology recommended outpatient cystoscopy when stable  Essential hypertension - bp remained stable thus far, held HCTZ due to poor oral intake, continue avapro -on gentle hydration due to poor oral intake  Controlled diabetes mellitus  type 2  without complications without long-term insulin use - Continue SSI, held metformin - Our pharmacy does not carry alogliptin-Pioglitazone  Depression/anxiety - Continue risperidone 0.5 mg at bedtime, xanax q8hrs prn and qhs prn - Psych consulted 01/07/2015 , recommended trial of zoloft  DVT Prophylaxis  - SCD's , was on heparin drip, now s/p ivf filter  Code Status: Patient's wishes are DNR/DNI Family Communication: Pt in room Disposition Plan: Unclear at this time, possible hospice? Pending Palliative Meeting 1/10   Consultants: Psychiatry Nerosurgery - Dr. Arnoldo Morale Oncology - Dr. Marin Olp Radiation oncology  Urology Dr. Louis Meckel  Procedures:  Stereotactic brain biopsy 01/11/2015  Antibiotics: Anti-infectives    Start     Dose/Rate Route Frequency Ordered Stop   01/14/15 1200  imipenem-cilastatin (PRIMAXIN) 500 mg in sodium chloride 0.9 % 100 mL IVPB     500 mg 200 mL/hr over 30 Minutes Intravenous Every 8 hours 01/14/15 1101     01/12/15 1630  cefTRIAXone (ROCEPHIN) 1 g in dextrose 5 % 50 mL IVPB  Status:  Discontinued     1 g 100 mL/hr over 30 Minutes Intravenous Every 24 hours 01/12/15 1605 01/14/15 1049   01/11/15 1800  ceFAZolin (ANCEF) IVPB 2 g/50 mL premix     2 g 100 mL/hr over 30 Minutes Intravenous Every 8 hours 01/11/15 1440 01/12/15 0217   01/11/15 1025  bacitracin 50,000 Units in sodium chloride irrigation 0.9 % 500 mL irrigation  Status:  Discontinued       As needed 01/11/15 1025 01/11/15 1107      HPI/Subjective: Patient remains pleasantly confused..  Objective: Filed Vitals:   01/15/15 1500 01/15/15 2211 01/16/15 0620 01/16/15 1336  BP: 137/55 131/69 163/72 138/74  Pulse: 68 64 60 85  Temp: 98.9 F (37.2 C) 98 F (36.7 C) 97.6 F (36.4 C) 97.5 F (36.4 C)  TempSrc: Oral Oral Oral Oral  Resp: '18 18 18 20  '$ Height:      Weight:      SpO2: 97% 96% 96% 98%    Intake/Output Summary (Last 24 hours) at 01/16/15 1548 Last data filed at  01/16/15 1501  Gross per 24 hour  Intake   1150 ml  Output   3500 ml  Net  -2350 ml   Filed Weights   01/04/15 2059 01/12/15 1759  Weight: 95.255 kg (210 lb) 86.183 kg (190 lb)    Exam:   General:  Asleep, easily arousable, in nad, laying in bed  Cardiovascular: regular, s1, s2  Respiratory: normal resp effort, no wheezing  Abdomen: soft,nondistended  Musculoskeletal: perfused, no clubbing  Data Reviewed: Basic Metabolic Panel:  Recent Labs Lab 01/10/15 0050 01/11/15 0543 01/16/15 0350  NA 142 143 141  K 3.7 4.0 4.1  CL 109 109 106  CO2 '25 25 28  '$ GLUCOSE 217* 161* 260*  BUN '13 7 13  '$ CREATININE 0.88 0.84 0.85  CALCIUM 8.8* 8.9 8.7*   Liver Function Tests: No results for input(s): AST, ALT, ALKPHOS, BILITOT, PROT, ALBUMIN in the last 168 hours. No results for input(s): LIPASE, AMYLASE in the last 168 hours. No results for input(s): AMMONIA in the last 168 hours. CBC:  Recent Labs Lab 01/11/15 0543 01/12/15 0255 01/14/15 0304 01/15/15 0414 01/16/15 0350  WBC 11.9* 10.9* 8.0 12.2* 10.6*  HGB 9.5* 9.0* 9.0* 9.9* 9.9*  HCT 29.6* 28.6* 28.5* 31.3* 31.6*  MCV 89.2 91.1 92.2 92.6 92.9  PLT 232 197 177 250 232   Cardiac Enzymes: No results for input(s): CKTOTAL, CKMB, CKMBINDEX, TROPONINI  in the last 168 hours. BNP (last 3 results) No results for input(s): BNP in the last 8760 hours.  ProBNP (last 3 results) No results for input(s): PROBNP in the last 8760 hours.  CBG:  Recent Labs Lab 01/15/15 1150 01/15/15 1701 01/15/15 2051 01/16/15 0759 01/16/15 1221  GLUCAP 245* 241* 287* 236* 234*    Recent Results (from the past 240 hour(s))  Culture, Urine     Status: None   Collection Time: 01/10/15  2:32 PM  Result Value Ref Range Status   Specimen Description URINE, RANDOM  Final   Special Requests NONE  Final   Culture   Final    60,000 COLONIES/ml CITROBACTER KOSERI 50,000 COLONIES/mL ESCHERICHIA COLI Confirmed Extended Spectrum  Beta-Lactamase Producer (ESBL) Performed at [facility]    Report Status 01/13/2015 FINAL  Final   Organism ID, Bacteria CITROBACTER KOSERI  Final   Organism ID, Bacteria ESCHERICHIA COLI  Final      Susceptibility   Citrobacter koseri - MIC*    CEFAZOLIN <=4 SENSITIVE Sensitive     CEFTRIAXONE <=1 SENSITIVE Sensitive     CIPROFLOXACIN <=0.25 SENSITIVE Sensitive     GENTAMICIN <=1 SENSITIVE Sensitive     IMIPENEM <=0.25 SENSITIVE Sensitive     NITROFURANTOIN 64 INTERMEDIATE Intermediate     TRIMETH/SULFA <=20 SENSITIVE Sensitive     PIP/TAZO <=4 SENSITIVE Sensitive     * 60,000 COLONIES/ml CITROBACTER KOSERI   Escherichia coli - MIC*    AMPICILLIN >=32 RESISTANT Resistant     CEFAZOLIN >=64 RESISTANT Resistant     CEFTRIAXONE >=64 RESISTANT Resistant     CIPROFLOXACIN <=0.25 SENSITIVE Sensitive     GENTAMICIN <=1 SENSITIVE Sensitive     IMIPENEM <=0.25 SENSITIVE Sensitive     NITROFURANTOIN <=16 SENSITIVE Sensitive     TRIMETH/SULFA >=320 RESISTANT Resistant     AMPICILLIN/SULBACTAM >=32 RESISTANT Resistant     PIP/TAZO <=4 SENSITIVE Sensitive     * 50,000 COLONIES/mL ESCHERICHIA COLI  MRSA PCR Screening     Status: None   Collection Time: 01/11/15  2:46 PM  Result Value Ref Range Status   MRSA by PCR NEGATIVE NEGATIVE Final    Comment:        The GeneXpert MRSA Assay (FDA approved for NASAL specimens only), is one component of a comprehensive MRSA colonization surveillance program. It is not intended to diagnose MRSA infection nor to guide or monitor treatment for MRSA infections.   Culture, blood (routine x 2)     Status: None   Collection Time: 01/11/15 10:16 PM  Result Value Ref Range Status   Specimen Description BLOOD LEFT HAND  Final   Special Requests IN PEDIATRIC BOTTLE 3CC  Final   Culture  Setup Time   Final    GRAM POSITIVE COCCI IN CLUSTERS AEROBIC BOTTLE ONLY CRITICAL RESULT CALLED TO, READ BACK BY AND VERIFIED WITH: Farrell Ours RN [id number] 01/12/15 A  BROWNING    Culture   Final    STAPHYLOCOCCUS SPECIES (COAGULASE NEGATIVE) THE SIGNIFICANCE OF ISOLATING THIS ORGANISM FROM A SINGLE SET OF BLOOD CULTURES WHEN MULTIPLE SETS ARE DRAWN IS UNCERTAIN. PLEASE NOTIFY THE MICROBIOLOGY DEPARTMENT WITHIN ONE WEEK IF SPECIATION AND SENSITIVITIES ARE REQUIRED.    Report Status 01/14/2015 FINAL  Final  Culture, blood (routine x 2)     Status: None   Collection Time: 01/11/15 10:22 PM  Result Value Ref Range Status   Specimen Description BLOOD RIGHT ANTECUBITAL  Final   Special Requests IN PEDIATRIC BOTTLE  3CC  Final   Culture NO GROWTH 5 DAYS  Final   Report Status 01/16/2015 FINAL  Final     Studies: No results found.  Scheduled Meds: . cholecalciferol  2,000 Units Oral Daily  . enoxaparin (LOVENOX) injection  130 mg Subcutaneous Q24H  . feeding supplement (GLUCERNA SHAKE)  237 mL Oral TID BM  . finasteride  5 mg Oral Daily  . fluticasone  1 spray Each Nare Daily  . imipenem-cilastatin  500 mg Intravenous Q8H  . insulin aspart  0-20 Units Subcutaneous TID WC  . insulin glargine  5 Units Subcutaneous Daily  . irbesartan  300 mg Oral Daily  . levETIRAcetam  500 mg Oral BID  . multivitamin with minerals  1 tablet Oral Daily  . pantoprazole (PROTONIX) IV  40 mg Intravenous QHS  . risperiDONE  0.5 mg Oral QHS  . sertraline  25 mg Oral Daily  . sodium chloride  3 mL Intravenous Q12H  . tamsulosin  0.4 mg Oral Daily  . vitamin E  200 Units Oral Daily   Continuous Infusions: . 0.9 % NaCl with KCl 20 mEq / L 50 mL/hr at 01/16/15 1249    Principal Problem:   Acute encephalopathy Active Problems:   Essential hypertension   GBM (glioblastoma multiforme) (HCC)   Anemia of chronic disease   Depression   Controlled type 2 diabetes mellitus without complication, without long-term current use of insulin (HCC)   Anxious mood   Confusion   Iron deficiency   Acute pulmonary embolism (HCC)   Mass, brain   Gross hematuria   Brain tumor  ([place])   Majorie Santee, [facility] Pager [phone]. If 7PM-7AM, please contact night-coverage at [email or web address], password [facility] 01/16/2015, 3:48 PM  LOS: 12 days

## 2015-01-16 NOTE — NC FL2 (Signed)
[place] LEVEL OF CARE SCREENING TOOL     IDENTIFICATION  Patient Name: Aaron White Birthdate: November 20, 1940 Sex: male Admission Date (Current Location): 01/04/2015  [facility] and [place] Number:  Herbalist and Address:  [facility],  [address], [place]      Provider Number: [id number]  Attending Physician Name and Address:  Donne Hazel, MD  Relative Name and Phone Number:       Current Level of Care: Hospital Recommended Level of Care: [place] Prior Approval Number:    Date Approved/Denied:   PASRR Number: [id number]  Discharge Plan: SNF    Current Diagnoses: Patient Active Problem List   Diagnosis Date Noted  . Brain tumor ([place]) 01/11/2015  . Mass, brain   . Gross hematuria   . Anxious mood   . Confusion   . Iron deficiency   . Acute pulmonary embolism ([place])   . GBM (glioblastoma multiforme) ([place]) 01/06/2015  . Acute encephalopathy 01/06/2015  . Anemia of chronic disease 01/06/2015  . Depression 01/06/2015  . Controlled type 2 diabetes mellitus without complication, without long-term current use of insulin ([place]) 01/06/2015  . Essential hypertension 12/26/2014    Orientation RESPIRATION BLADDER Height & Weight    Self, Place  O2 (2 L) Incontinent 5\' 11"  (180.3 cm) 190 lbs.  BEHAVIORAL SYMPTOMS/MOOD NEUROLOGICAL BOWEL NUTRITION STATUS   (no behaviors)  (NONE) Continent Diet (Diet Regular)  AMBULATORY STATUS COMMUNICATION OF NEEDS Skin   Extensive Assist Verbally Other (Comment) (Incision (closed) 01/09/14 right neck treatment gauze; Incision (closed) 01/10/14 head treatment gauze)                       Personal Care Assistance Level of Assistance  Bathing, Feeding, Dressing Bathing Assistance: Limited assistance Feeding assistance: Independent Dressing Assistance: Limited assistance     Functional Limitations Info  Sight, Hearing, Speech Sight Info: Adequate Hearing Info:  Adequate Speech Info: Adequate    SPECIAL CARE FACTORS FREQUENCY  PT (By licensed PT), OT (By licensed OT)     PT Frequency: 5 x a week OT Frequency: 5 x a week            Contractures Contractures Info: Not present    Additional Factors Info  Code Status, Allergies, Insulin Sliding Scale, Isolation Precautions Code Status Info: DNR code status Allergies Info: Strawberry Flavor, Penicillins   Insulin Sliding Scale Info: Sliding Scale Insulin 3 x a day Isolation Precautions Info: Extended spectrum beta lactamase: 01/10/2015 Urine culture lab confirmed ESBL. Requires contact precautions with each admission.     Current Medications (01/16/2015):  This is the current hospital active medication list Current Facility-Administered Medications  Medication Dose Route Frequency Provider Last Rate Last Dose  . 0.9 % NaCl with KCl 20 mEq/ L  infusion   Intravenous Continuous Robbie Lis, MD 50 mL/hr at 01/16/15 1249    . acetaminophen (TYLENOL) tablet 650 mg  650 mg Oral Q4H PRN Newman Pies, MD   650 mg at 01/12/15 1248   Or  . acetaminophen (TYLENOL) suppository 650 mg  650 mg Rectal Q4H PRN Newman Pies, MD   650 mg at 01/12/15 0148  . ALPRAZolam Duanne Moron) tablet 0.25 mg  0.25 mg Oral TID PRN Reubin Milan, MD   0.25 mg at 01/15/15 2312  . cholecalciferol (VITAMIN D) tablet 2,000 Units  2,000 Units Oral Daily Reubin Milan, MD   2,000 Units at 01/16/15 0825  .  enoxaparin (LOVENOX) injection 130 mg  130 mg Subcutaneous Q24H Anh P Pham, RPH   130 mg at 01/16/15 1422  . feeding supplement (GLUCERNA SHAKE) (GLUCERNA SHAKE) liquid 237 mL  237 mL Oral TID BM Florencia Reasons, MD   237 mL at 01/16/15 1000  . finasteride (PROSCAR) tablet 5 mg  5 mg Oral Daily Star Age, MD   5 mg at 01/16/15 0825  . fluticasone (FLONASE) 50 MCG/ACT nasal spray 1 spray  1 spray Each Nare Daily Reubin Milan, MD   1 spray at 01/16/15 1000  . hydrocortisone (ANUSOL-HC) 2.5 % rectal cream 1 application   1 application Rectal BID PRN Reubin Milan, MD      . imipenem-cilastatin (PRIMAXIN) 500 mg in sodium chloride 0.9 % 100 mL IVPB  500 mg Intravenous Q8H Terri L Green, RPH   500 mg at 01/16/15 1249  . insulin aspart (novoLOG) injection 0-20 Units  0-20 Units Subcutaneous TID WC Donne Hazel, MD   7 Units at 01/16/15 1249  . insulin glargine (LANTUS) injection 5 Units  5 Units Subcutaneous Daily Donne Hazel, MD   5 Units at 01/16/15 1250  . irbesartan (AVAPRO) tablet 300 mg  300 mg Oral Daily Reubin Milan, MD   300 mg at 01/16/15 0825  . labetalol (NORMODYNE,TRANDATE) injection 10-40 mg  10-40 mg Intravenous Q10 min PRN Newman Pies, MD      . levETIRAcetam (KEPPRA) tablet 500 mg  500 mg Oral BID Donne Hazel, MD   500 mg at 01/16/15 0826  . morphine 2 MG/ML injection 1-2 mg  1-2 mg Intravenous Q2H PRN Newman Pies, MD   2 mg at 01/16/15 0411  . multivitamin with minerals tablet 1 tablet  1 tablet Oral Daily Reubin Milan, MD   1 tablet at 01/16/15 [phone]  . ondansetron (ZOFRAN) tablet 4 mg  4 mg Oral Q6H PRN Reubin Milan, MD   4 mg at 01/15/15 1220   Or  . ondansetron [facility]) injection 4 mg  4 mg Intravenous Q6H PRN Reubin Milan, MD      . pantoprazole (PROTONIX) injection 40 mg  40 mg Intravenous QHS Newman Pies, MD   40 mg at 01/15/15 2116  . polyvinyl alcohol (LIQUIFILM TEARS) 1.4 % ophthalmic solution 1 drop  1 drop Both Eyes TID PRN Florencia Reasons, MD   1 drop at 01/07/15 1702  . promethazine (PHENERGAN) tablet 12.5-25 mg  12.5-25 mg Oral Q4H PRN Newman Pies, MD      . risperiDONE (RISPERDAL) tablet 0.5 mg  0.5 mg Oral QHS Reubin Milan, MD   0.5 mg at 01/15/15 2116  . sertraline (ZOLOFT) tablet 25 mg  25 mg Oral Daily Florencia Reasons, MD   25 mg at 01/16/15 0826  . sodium chloride 0.9 % injection 3 mL  3 mL Intravenous Q12H Reubin Milan, MD   3 mL at 01/12/15 0936  . tamsulosin (FLOMAX) capsule 0.4 mg  0.4 mg Oral Daily Florencia Reasons, MD   0.4 mg at  01/16/15 0826  . vitamin E capsule 200 Units  200 Units Oral Daily Reubin Milan, MD   200 Units at 01/16/15 1250     Discharge Medications: Please see discharge summary for a list of discharge medications.  Relevant Imaging Results:  Relevant Lab Results:   Additional Information SSN: [id number]. Pt current insurance is [employer] [place] subscriber #: [id number] Group #: [id number]. Palliative Care follow up needed  at SNF.  Tanganika Barradas A, LCSW

## 2015-01-16 NOTE — Progress Notes (Signed)
The biopsy of the brain mass came back as anaplastic astrocytoma. This actually is a little better than I would've thought.  I think the problem never going to have is whether or not he will be able to take radiation. He has episodes of confusion. This morning, he seems to be fairly alert. He is not sure what the month is. He did know that it snowed over the weekend.  He is not walking. He needs a lot of physical therapy.  He is on heparin. I think we should probably try him on Lovenox. I know he has the filter in. However, I think that he is at significant risk for thromboembolic events.  His wife was there this morning. We had a long talk. She just wants his quality of life to be better. I told her that even with therapy, I'm not sure if his quality of life will get better.  He is not currently a go home. His wife had cardiac surgery back in September. She just cannot handle him at home. He needs either a rehabilitation facility or a nursing home. He is not a candidate for [facility].  I do think that hospice is probably would not be a bad idea for him.  As far as trying to treat this astrocytoma, I think this will come down to whether or not he would be able to be cognizant enough to have radiation.  I think he could tolerate Temodar.  He seems to be eating a little bit better. He's had no nausea or vomiting.  He has a condom catheter on. He is "messing with this."  I think physical therapy would be very important for him.  His blood sugars are quite high. I'm going to stop the Decadron for right now. It is possible the Decadron might be affecting his mental status.  He is not in any obvious pain.  He is growing 2 different types of bacteria his urine. He is on Primaxin for this.  This is a Complicated problem. Again, I think that his mental status is really going to dictate whether or not we will be able to treat him.  Lum Keas

## 2015-01-17 DIAGNOSIS — Z66 Do not resuscitate: Secondary | ICD-10-CM

## 2015-01-17 DIAGNOSIS — Z515 Encounter for palliative care: Secondary | ICD-10-CM

## 2015-01-17 DIAGNOSIS — R400 Somnolence: Secondary | ICD-10-CM

## 2015-01-17 DIAGNOSIS — G893 Neoplasm related pain (acute) (chronic): Secondary | ICD-10-CM | POA: Insufficient documentation

## 2015-01-17 DIAGNOSIS — R4182 Altered mental status, unspecified: Secondary | ICD-10-CM | POA: Insufficient documentation

## 2015-01-17 LAB — GLUCOSE, CAPILLARY
GLUCOSE-CAPILLARY: 247 mg/dL — AB (ref 65–99)
Glucose-Capillary: 172 mg/dL — ABNORMAL HIGH (ref 65–99)
Glucose-Capillary: 232 mg/dL — ABNORMAL HIGH (ref 65–99)
Glucose-Capillary: 168 mg/dL — ABNORMAL HIGH (ref 65–99)
Glucose-Capillary: 249 mg/dL — ABNORMAL HIGH (ref 65–99)

## 2015-01-17 MED ORDER — PANTOPRAZOLE SODIUM 40 MG PO TBEC
40.0000 mg | DELAYED_RELEASE_TABLET | Freq: Every day | ORAL | Status: DC
  Administered 2015-01-17 – 2015-01-18 (×2): 40 mg via ORAL
  Filled 2015-01-17 (×2): qty 1

## 2015-01-17 MED ORDER — LORAZEPAM 2 MG/ML IJ SOLN
0.5000 mg | INTRAMUSCULAR | Status: DC
  Administered 2015-01-17 – 2015-01-19 (×9): 0.5 mg via INTRAVENOUS
  Filled 2015-01-17 (×9): qty 1

## 2015-01-17 MED ORDER — MORPHINE SULFATE (PF) 2 MG/ML IV SOLN
2.0000 mg | Freq: Four times a day (QID) | INTRAVENOUS | Status: DC
  Administered 2015-01-17 – 2015-01-19 (×6): 2 mg via INTRAVENOUS
  Filled 2015-01-17 (×6): qty 1

## 2015-01-17 NOTE — Consult Note (Signed)
Consultation Note Date: 01/17/2015   Patient Name: Aaron White  DOB: 1940-11-24  MRN: [id number]  Age / Sex: 75 y.o., male  PCP: Deland Pretty, MD Referring Physician: Donne Hazel, MD  Reason for Consultation: Establishing goals of care, Non pain symptom management, Pain control and Psychosocial/spiritual support     Clinical Assessment/Narrative:   75 y.o. male with past medical history of hypertension, type 2 diabetes, GERD, diverticulosis, urolithiasis, obstructive sleep apnea on CPAP was recently discharged from the hospital on 12/28/2014 for lower GI bleed and is being brought to the emergency department due to progressively worse confusion over the past few days.   Admitted on 12/28 with worsening confusion and subsequent findings of left lower extremity DVT and tumor infiltrating the splenium of the corpus callosum and crossing bilaterally into the posterior medial temporal lobes highly suspicious for glioblastoma multiforme. Further imaging studies also revealed small pulmonary embolus in the left lower lobe, a 3.1 cm mass in the base of the bladder thought to arise from the prostate, and right thyroid nodule.   Patient had brain biopsy on 1/4 at [facility], and  IVC filter placement prior to surgery.  - ANAPLASTIC ASTROCYTOMA, WHO GRADE III/IV.  Patient and family faced with treatment option decisions and anticipatory care needs   This NP Wadie Lessen reviewed medical records, received report from team, assessed the patient and then meet at the patient's bedside along with his wife and family friend Karena Addison)  to discuss diagnosis, prognosis, [place], EOL wishes disposition and options.   A detailed discussion was had today regarding advanced directives.  Concepts specific to code status, artifical feeding and hydration, continued IV antibiotics and rehospitalization was had.  The difference between a  aggressive medical intervention path  and a palliative comfort care path for this patient at this time was had.  Values and goals of care important to patient and family were attempted to be elicited.  Concept of Hospice and Palliative Care were discussed.  MOST form introduced  Natural trajectory and expectations at EOL were discussed.  Questions and concerns addressed.  Hard Choices booklet left for review. Family encouraged to call with questions or concerns.  PMT will continue to support holistically.     Primary Decision Maker: wife    HCPOA: yes    SUMMARY OF RECOMMENDATIONS  - focus is comfort, quality and dignity - allow for a natural death - no life prolonging measures, no chemotherapy or radiation - minimize medications and interventions, no CPap -no artificial feeding or hydration or antibiotics -symptom management to enhance comfort   Code Status/Advance Care Planning: DNR    Code Status Orders        Start     Ordered   01/14/15 1541  Do not attempt resuscitation (DNR)   Continuous    Question Answer Comment  In the event of cardiac or respiratory ARREST Do not call a "code blue"   In the event of cardiac or respiratory ARREST Do not perform Intubation, CPR, defibrillation or ACLS   In the event of cardiac or respiratory ARREST Use medication by any route, position, wound care, and other measures to relive pain and suffering. May use oxygen, suction and manual treatment of airway obstruction as needed for comfort.      01/14/15 1540    Code Status History    Date Active Date Inactive Code Status Order ID Comments User Context   01/11/2015  2:40 PM 01/14/2015  3:40 PM Full Code  [id number]  Newman Pies, MD Inpatient   01/04/2015 10:40 PM 01/11/2015  2:40 PM Full Code [id number]  Reubin Milan, MD Inpatient   12/26/2014  4:37 PM 12/28/2014  7:33 PM Full Code [id number]  Waldemar Dickens, MD ED   06/14/2011  8:48 PM 06/16/2011  2:19 PM Full Code [id number]  Clydene Pugh, RN Inpatient    Advance Directive Documentation        Most Recent Value   Type of Advance Directive  Healthcare Power of Attorney   Pre-existing out of facility DNR order (yellow form or pink MOST form)     "MOST" Form in Place?         Symptom Management:   Pain: family reports patient has had chronic pain behind his eyes and they notice him holding his face and squinting  eyes-  Will schedule                        Morphine 2 mg every 6 hrs and Morphine 1-2 mg prn for breakthrough pain every 2 hrs prn *          Anxiety:  Patient has verbalized confusion and fears to family- Ativan 1 mg IV every 4 hrs scheudled  Palliative Prophylaxis:   Aspiration, Bowel Regimen, Delirium Protocol, Frequent Pain Assessment and Oral Care  Additional Recommendations (Limitations, Scope, Preferences):  Full Comfort Care, Minimize Medications, No Artificial Feeding, No Blood Transfusions, No Chemotherapy, No Diagnostics, No Glucose Monitoring, No Hemodialysis, No IV Antibiotics, No IV Fluids, No Lab Draws, No Radiation and No Surgical Procedures   Psycho-social/Spiritual:  Support System: Strong Desire for further Chaplaincy support:yes Additional Recommendations: Education on Hospice   Discharge Planning: Pending, re-evaluate in morning   Chief Complaint/ Primary Diagnoses: Present on Admission:  . Essential hypertension . GBM (glioblastoma multiforme) ([place]) . Acute encephalopathy . Anemia of chronic disease . Depression . Brain tumor ([place])  I have reviewed the medical record, interviewed the patient and family, and examined the patient. The following aspects are pertinent.  History reviewed. No pertinent past medical history. Social History   Social History  . Marital Status: Married    Spouse Name: N/A  . Number of Children: 3  . Years of Education: N/A   Occupational History  . Teacher [employer]   Social History Main Topics  . Smoking status: Never  Smoker   . Smokeless tobacco: Never Used  . Alcohol Use: No  . Drug Use: No  . Sexual Activity: Not Currently   Other Topics Concern  . None   Social History Narrative   Family History  Problem Relation Age of Onset  . Heart disease Mother   . Stroke Father    Scheduled Meds: . cholecalciferol  2,000 Units Oral Daily  . enoxaparin (LOVENOX) injection  130 mg Subcutaneous Q24H  . feeding supplement (GLUCERNA SHAKE)  237 mL Oral TID BM  . finasteride  5 mg Oral Daily  . fluticasone  1 spray Each Nare Daily  . imipenem-cilastatin  500 mg Intravenous Q8H  . insulin aspart  0-20 Units Subcutaneous TID WC  . insulin glargine  5 Units Subcutaneous Daily  . irbesartan  300 mg Oral Daily  . levETIRAcetam  500 mg Oral BID  . multivitamin with minerals  1 tablet Oral Daily  . pantoprazole  40 mg Oral QHS  . risperiDONE  0.5 mg Oral QHS  . sertraline  25 mg Oral Daily  . sodium  chloride  3 mL Intravenous Q12H  . tamsulosin  0.4 mg Oral Daily  . vitamin E  200 Units Oral Daily   Continuous Infusions: . 0.9 % NaCl with KCl 20 mEq / L 50 mL/hr at 01/16/15 1249   PRN Meds:.acetaminophen **OR** acetaminophen, ALPRAZolam, hydrocortisone, labetalol, morphine injection, ondansetron **OR** ondansetron (ZOFRAN) IV, polyvinyl alcohol, promethazine Medications Prior to Admission:  Prior to Admission medications   Medication Sig Start Date End Date Taking? Authorizing Provider  aspirin EC 81 MG tablet Take 1 tablet (81 mg total) by mouth daily. Stop taking for a week, and if no further bleeding can resume Patient taking differently: Take 81 mg by mouth daily.  06/16/11  Yes Shanker Kristeen Mans, MD  Cholecalciferol (VITAMIN D3) 2000 UNITS capsule Take 2,000 Units by mouth daily.   Yes Historical Provider, MD  Cinnamon 500 MG capsule Take 500 mg by mouth 2 (two) times daily.   Yes Historical Provider, MD  esomeprazole (NEXIUM) 20 MG capsule Take 20 mg by mouth daily at 12 noon.   Yes Historical  Provider, MD  fluticasone (FLONASE) 50 MCG/ACT nasal spray USE 1 SPRAY IN EACH NOSTRIL TWICE DAILY AS NEEDED FOR ALLERGIES 08/27/14  Yes Historical Provider, MD  hydrocortisone (ANUSOL-HC) 2.5 % rectal cream Place rectally 2 (two) times daily. Patient taking differently: Place 1 application rectally 2 (two) times daily as needed for hemorrhoids.  12/28/14  Yes Kelvin Cellar, MD  metaxalone (SKELAXIN) 800 MG tablet Take 800 mg by mouth daily as needed for muscle spasms. Reported on 12/26/2014 09/23/14  Yes Historical Provider, MD  metFORMIN (GLUCOPHAGE) 1000 MG tablet Take 1,000 mg by mouth 2 (two) times daily with a meal.     Yes Historical Provider, MD  Misc Natural Products (OSTEO BI-FLEX JOINT SHIELD) TABS Take 1 tablet by mouth 2 (two) times daily.    Yes Historical Provider, MD  Multiple Vitamin (MULTIVITAMIN WITH MINERALS) TABS Take 1 tablet by mouth daily.   Yes Historical Provider, MD  OSENI 25-15 MG TABS Take 1 tablet by mouth daily. 10/24/14  Yes Historical Provider, MD  Specialty Vitamins Products (PROSTATE PO) Take 1 tablet by mouth 2 (two) times daily.   Yes Historical Provider, MD  telmisartan-hydrochlorothiazide (MICARDIS HCT) 80-25 MG per tablet Take 1 tablet by mouth daily.     Yes Historical Provider, MD  vitamin E 200 UNIT capsule Take 200 Units by mouth daily.   Yes Historical Provider, MD  traMADol (ULTRAM) 50 MG tablet Take 1 tablet (50 mg total) by mouth every 6 (six) hours as needed. Patient not taking: Reported on 01/04/2015 11/24/14   Daleen Bo, MD   Allergies  Allergen Reactions  . Strawberry Flavor Hives  . Penicillins Rash    Has patient had a PCN reaction causing immediate rash, facial/tongue/throat swelling, SOB or lightheadedness with hypotension: No Has patient had a PCN reaction causing severe rash involving mucus membranes or skin necrosis: No Has patient had a PCN reaction that required hospitalization No Has patient had a PCN reaction occurring within  the last 10 years: No If all of the above answers are "NO", then may proceed with Cephalosporin use.     Review of Systems  Unable to perform ROS   Physical Exam  Constitutional: Vital signs are normal. He appears well-developed. He appears distressed.  HENT:  Mouth/Throat: Mucous membranes are normal.  Cardiovascular: Normal rate, regular rhythm and normal heart sounds.   Respiratory: Effort normal and breath sounds normal.  GI: Soft. Normal  appearance.  Neurological: He is alert.  Skin: Skin is warm and dry.  Psychiatric: His mood appears anxious. Cognition and memory are impaired.    Vital Signs: BP 151/69 mmHg  Pulse 71  Temp(Src) 98.4 F (36.9 C) (Oral)  Resp 18  Ht 5\' 11"  (1.803 m)  Wt 86.183 kg (190 lb)  BMI 26.51 kg/m2  SpO2 92%  SpO2: SpO2: 92 % O2 Device:SpO2: 92 % O2 Flow Rate: .O2 Flow Rate (L/min): 2 L/min  IO: Intake/output summary:  Intake/Output Summary (Last 24 hours) at 01/17/15 1040 Last data filed at 01/17/15 1010  Gross per 24 hour  Intake 2219.17 ml  Output   1700 ml  Net 519.17 ml    LBM: Last BM Date: 01/13/15 Baseline Weight: Weight: 95.255 kg (210 lb) Most recent weight: Weight: 86.183 kg (190 lb)      Palliative Assessment/Data:  Flowsheet Rows        Most Recent Value   Intake Tab    Referral Department  Hospitalist   Unit at Time of Referral  Oncology Unit   Palliative Care Primary Diagnosis  Cancer   Date Notified  01/16/15   Palliative Care Type  New Palliative care   Reason for referral  Clarify Goals of Care, Counsel Regarding Hospice   Date of Admission  01/04/15   Date first seen by Palliative Care  01/16/15   # of days Palliative referral response time  0 Day(s)   # of days IP prior to Palliative referral  12   Clinical Assessment    Psychosocial & Spiritual Assessment    Palliative Care Outcomes       Additional Data Reviewed:  CBC:    Component Value Date/Time   WBC 10.6* 01/16/2015 0350   HGB 9.9*  01/16/2015 0350   HCT 31.6* 01/16/2015 0350   PLT 232 01/16/2015 0350   MCV 92.9 01/16/2015 0350   NEUTROABS 4.8 01/07/2015 0150   LYMPHSABS 1.3 01/07/2015 0150   MONOABS 0.4 01/07/2015 0150   EOSABS 0.1 01/07/2015 0150   BASOSABS 0.0 01/07/2015 0150   Comprehensive Metabolic Panel:    Component Value Date/Time   NA 141 01/16/2015 0350   K 4.1 01/16/2015 0350   CL 106 01/16/2015 0350   CO2 28 01/16/2015 0350   BUN 13 01/16/2015 0350   CREATININE 0.85 01/16/2015 0350   GLUCOSE 260* 01/16/2015 0350   CALCIUM 8.7* 01/16/2015 0350   AST 18 01/07/2015 0150   ALT 20 01/07/2015 0150   ALKPHOS 46 01/07/2015 0150   BILITOT 1.1 01/07/2015 0150   PROT 6.1* 01/07/2015 0150   ALBUMIN 3.4* 01/07/2015 0150   Discussed with Dr Wyline Copas  Time In: 1130 Time Out: 1300 Time Total: 90 min Greater than 50%  of this time was spent counseling and coordinating care related to the above assessment and plan.  Signed by: Wadie Lessen, NP  Knox Royalty, NP  01/17/2015, 10:40 AM  Please contact Palliative Medicine Team phone at [phone] for questions and concerns.

## 2015-01-17 NOTE — Progress Notes (Signed)
Key Points: Use following P&T approved IV to PO non-antibiotic change policy.  Description contains the criteria that are approved Note: Policy Excludes:  Esophagectomy patientsPHARMACIST - PHYSICIAN COMMUNICATION DR:   Aaron White CONCERNING: IV to Oral Route Change Policy  RECOMMENDATION: This patient is receiving protonix by the intravenous route.  Based on criteria approved by the Pharmacy and Therapeutics Committee, the intravenous medication(s) is/are being converted to the equivalent oral dose form(s).   DESCRIPTION: These criteria include:  The patient is eating (either orally or via tube) and/or has been taking other orally administered medications for a least 24 hours  The patient has no evidence of active gastrointestinal bleeding or impaired GI absorption (gastrectomy, short bowel, patient on TNA or NPO).  If you have questions about this conversion, please contact the Pharmacy Department  []   [phone] )  Aaron White []   [phone] )  Aaron White  []   [phone] )  [facility] [x]   [phone] )  [place], [place], [facility] 01/17/2015 10:36 AM

## 2015-01-17 NOTE — Progress Notes (Signed)
Pharmacy Antibiotic Follow-up Note  Aaron White is a 75 y.o. year-old male admitted on 01/04/2015.  The patient is currently on day 4 of Primaxin for Citrobacter and ESBL Ecoli UTI.  Assessment/Plan: This patient's current antibiotics will be continued without adjustments. Noted [place] meeting today at noon per notes. If antibiotics need to be continued pending meeting, recommend changing Primaxin to Invanz 1g IV daily and this will lower risk of decreased seizure threshold that is present with Primaxin  Temp (24hrs), Avg:98.2 F (36.8 C), Min:97.5 F (36.4 C), Max:98.8 F (37.1 C)   Recent Labs Lab 01/11/15 0543 01/12/15 0255 01/14/15 0304 01/15/15 0414 01/16/15 0350  WBC 11.9* 10.9* 8.0 12.2* 10.6*    Recent Labs Lab 01/11/15 0543 01/16/15 0350  CREATININE 0.84 0.85   Estimated Creatinine Clearance: 80 mL/min (by C-G formula based on Cr of 0.85).    Allergies  Allergen Reactions  . Strawberry Flavor Hives  . Penicillins Rash    Has patient had a PCN reaction causing immediate rash, facial/tongue/throat swelling, SOB or lightheadedness with hypotension: No Has patient had a PCN reaction causing severe rash involving mucus membranes or skin necrosis: No Has patient had a PCN reaction that required hospitalization No Has patient had a PCN reaction occurring within the last 10 years: No If all of the above answers are "NO", then may proceed with Cephalosporin use.    Antibiotics this Admission: 1/4 Ancef >> 1/5 1/5 Ceftriaxone >> 1/7 1/7 Primaxin (ESBL E coli) >>  Microbiology Results: 1/4 BCx: 1/2 CoNS 1/3 UCx: Citrobacter pan-sens, incl Ceftrx, Imipenem, cipro  E coli ESBL - sens imipenem, Zosyn, Cipro, gent, nitro 1/4 MRSA PCR: neg    Adrian Saran, PharmD, BCPS Pager [phone] 01/17/2015 10:33 AM

## 2015-01-17 NOTE — Progress Notes (Signed)
   01/17/15 1500  Clinical Encounter Type  Visited With Patient and family together  Visit Type Follow-up;Psychological support;Spiritual support  Referral From Physician  Consult/Referral To Chaplain  Spiritual Encounters  Spiritual Needs Emotional;Other (Comment) (Pastoral Conversation/Support)  Stress Factors  Patient Stress Factors Health changes;Major life changes  Family Stress Factors Health changes;Major life changes;Other (Comment)   I was paged to visit with the patient by the unit secretary who stated that the patient's physician requested that I check with the patient due to emotional concerns. This was a follow-up visit due to my previous encounters with the patient and his wife. See my previous notes for more information. The patient's wife was sitting in a chair outside of the room talking on the phone.  I gowned and gloved and entered the room. The patient remembered me from the previous visits. The patient seemed upbeat and cheerful. Pt. Joked with myself and the nurses at his bedside. He stated that his only concern was that he didn't know how the procedure at [facility] went. 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  When I left the room I checked with his wife, who was visibly tearful. She referred to her husband's condition as," the end." She understands that his condition will not get better and that there is nothing else that the doctors can do for her husband.  The patient's wife is concerned about her husband's emotional state. She said that he keeps saying that he "wants to be dead and that he has never been this sick before."  The patient has two sons who are on their way to support the mother and patient. The phone rang again and I told the wife that I would return at a later time.  I will follow-up with the patient and his wife at a later time.   [place] M.Div.

## 2015-01-17 NOTE — Progress Notes (Signed)
[facility] PROGRESS NOTE  Aaron White [id number] DOB: Apr 21, 1940 DOA: 01/04/2015 PCP: Horatio Pel, MD  HPI/Brief narrative  75 y.o. male with past medical history significant for hypertension, diabetes, obstructive sleep apnea on CPAP, recently hospitalized for GI bleed who now presented to [facility] because of worsening confusion over past few days prior to this admission. Per patient's family, ever since discharge 12/26/2014 patient was not really himself. Patient would say things such as him being afraid of dying and that he is losing the mammary and he wanted to see his son in [place] but was worried as he has a fear of flying.   Patient was hemodynamically stable on the admission. Blood work was significant for hemoglobin of 8.1, otherwise unremarkable. He was admitted for further evaluation of acute encephalopathy. 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 Neurosurgery, radiation oncology and oncology consulted.   Assessment/Plan: Acute encephalopathy / Glioblastoma multiforme  - MRI brain with findings of tumor infiltrating the splenium of the corpus callosum and crossing bilaterally into the posterior medial temporal lobes - Neurosurgery, radiation oncology and oncology were consulted. - Dr. Arnoldo Morale of neurosurgerywas consulted and patient is status post biopsy on 1/4 - S/P stereotactic brain biopsy on 01/11/2015. He tolerated the procedure well there were no complications.  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  - Input from Dr. Marin Olp noted and appreciated. Decadron stopped secondary to confusion.  - Palliative Care consulted and appreciate input. Discussed with Wadie Lessen, NP. Patient and family have decided on focus on full comfort and to allow a natural  death without life-prolonging measures, chemo, or radiation. - Palliative Care to re-assess pt tomorrow to determine if patient will qualify for residential hospice, which would be ideal given the circumstances  PE/DVT:  -CT scan of lungs with IV contrast performed on 01/06/2015 showing small pulmonary embolus noted within a segmental branch of the left lower lobe. Bilateral lower extremity Dopplers performed on 01/08/2015 showing acute DVT involving left posterior tibial vein. -Started on heparin drip on admission, Heparin drip was stopped on 1/3 due to new onset of gross hematuria, now on lovenox -IVC filter placed by IR on 1/3 at [place]  Gross hematuria/3.1 cm bladder vs prostate mass:  -Heparin drip stopped, ua/urine culture ordered on 1/3, consider abx if hematuria persists or fever, urology consulted -CT findings of prostate likely a benign etiology. Urology recommending cystoscopy in the outpatient setting. No further interventions during this hospitalization recommended  Anemia of chronic disease and iron deficiency anemia and blood loss anemia for hematuria, dose has h/o gi bleed from hemorrhoids/diverticula, stool occult blood this admission negative. - Recent GI bleed and colonoscopy 12/28/2014 with finding of hemorrhoids -- Aspirin placed on hold, Continue protonix daily  - Transfuse 1 unit PRBC on 12/30, 1uint on 1/3 -s/p iv iron , stool occult blood daily  Frequent urination and prostate pain -Enlarged prostate on ct ab: ua unremarkable, psa 4.3 on 12/29 and 2.69 on 1/1 , started flomax, urology recommended outpatient cystoscopy when stable  Essential hypertension - bp remained stable thus far, held HCTZ due to poor oral intake, continue avapro -on gentle hydration due to poor oral intake  Controlled diabetes mellitus type 2 without complications without long-term insulin use - comfort measures - Had been on ssi and lantus, now d/c'd  Depression/anxiety - now focus  on comfort  DVT Prophylaxis  - d/c'd prophylaxis given focus on comfort  Code Status: Patient's wishes are DNR/DNI Family Communication: Pt in room, wife at bedside Disposition Plan: Possible d/c to residential hospice in 24hrs   Consultants: Psychiatry Nerosurgery - Dr. Arnoldo Morale Oncology - Dr. Marin Olp Radiation oncology  Urology Dr. Louis Meckel Palliative Care  Procedures:  Stereotactic brain biopsy 01/11/2015  Antibiotics: Anti-infectives    Start     Dose/Rate Route Frequency Ordered Stop   01/14/15 1200  imipenem-cilastatin (PRIMAXIN) 500 mg in sodium chloride 0.9 % 100 mL IVPB  Status:  Discontinued     500 mg 200 mL/hr over 30 Minutes Intravenous Every 8 hours 01/14/15 1101 01/17/15 1253   01/12/15 1630  cefTRIAXone (ROCEPHIN) 1 g in dextrose 5 % 50 mL IVPB  Status:  Discontinued     1 g 100 mL/hr over 30 Minutes Intravenous Every 24 hours 01/12/15 1605 01/14/15 1049   01/11/15 1800  ceFAZolin (ANCEF) IVPB 2 g/50 mL premix     2 g 100 mL/hr over 30 Minutes Intravenous Every 8 hours 01/11/15 1440 01/12/15 0217   01/11/15 1025  bacitracin 50,000 Units in sodium chloride irrigation 0.9 % 500 mL irrigation  Status:  Discontinued       As needed 01/11/15 1025 01/11/15 1107      HPI/Subjective: Confused this AM  Objective: Filed Vitals:   01/16/15 1336 01/16/15 2114 01/17/15 0456 01/17/15 1331  BP: 138/74 155/66 151/69 167/88  Pulse: 85 76 71 102  Temp: 97.5 F (36.4 C) 98.8 F (37.1 C) 98.4 F (36.9 C) 97.8 F (36.6 C)  TempSrc: Oral Oral Oral Oral  Resp: 20 20 18 18   Height:      Weight:      SpO2: 98% 95% 92% 94%    Intake/Output Summary (Last 24 hours) at 01/17/15 1559 Last data filed at 01/17/15 1010  Gross per 24 hour  Intake 1549.17 ml  Output   1100 ml  Net 449.17 ml   Filed Weights   01/04/15 2059 01/12/15 1759  Weight: 95.255 kg (210 lb) 86.183 kg (190 lb)    Exam:   General:  Asleep, easily arousable, in nad, laying in  bed  Cardiovascular: regular, s1, s2  Respiratory: normal resp effort, no audible wheezing  Abdomen: soft,nondistended, pos BS  Musculoskeletal: perfused, no clubbing  Data Reviewed: Basic Metabolic Panel:  Recent Labs Lab 01/11/15 0543 01/16/15 0350  NA 143 141  K 4.0 4.1  CL 109 106  CO2 25 28  GLUCOSE 161* 260*  BUN 7 13  CREATININE 0.84 0.85  CALCIUM 8.9 8.7*   Liver Function Tests: No results for input(s): AST, ALT, ALKPHOS, BILITOT, PROT, ALBUMIN in the last 168 hours. No results for input(s): LIPASE, AMYLASE in the last 168 hours. No results for input(s): AMMONIA in the last 168 hours. CBC:  Recent Labs Lab 01/11/15 0543 01/12/15 0255 01/14/15 0304 01/15/15 0414 01/16/15 0350  WBC 11.9* 10.9* 8.0 12.2* 10.6*  HGB 9.5* 9.0* 9.0* 9.9* 9.9*  HCT 29.6* 28.6* 28.5* 31.3* 31.6*  MCV 89.2 91.1 92.2 92.6 92.9  PLT 232 197 177 250 232   Cardiac Enzymes: No results for input(s): CKTOTAL, CKMB, CKMBINDEX, TROPONINI in the last 168 hours. BNP (last 3 results) No results for input(s): BNP in the last 8760 hours.  ProBNP (last 3 results) No results for input(s): PROBNP in the last 8760 hours.  CBG:  Recent Labs Lab 01/16/15 1221 01/16/15 1657 01/16/15 2116 01/17/15 0751 01/17/15 1138  GLUCAP  234* 221* 247* 172* 232*    Recent Results (from the past 240 hour(s))  Culture, Urine     Status: None   Collection Time: 01/10/15  2:32 PM  Result Value Ref Range Status   Specimen Description URINE, RANDOM  Final   Special Requests NONE  Final   Culture   Final    60,000 COLONIES/ml CITROBACTER KOSERI 50,000 COLONIES/mL ESCHERICHIA COLI Confirmed Extended Spectrum Beta-Lactamase Producer (ESBL) Performed at [facility]    Report Status 01/13/2015 FINAL  Final   Organism ID, Bacteria CITROBACTER KOSERI  Final   Organism ID, Bacteria ESCHERICHIA COLI  Final      Susceptibility   Citrobacter koseri - MIC*    CEFAZOLIN <=4 SENSITIVE Sensitive      CEFTRIAXONE <=1 SENSITIVE Sensitive     CIPROFLOXACIN <=0.25 SENSITIVE Sensitive     GENTAMICIN <=1 SENSITIVE Sensitive     IMIPENEM <=0.25 SENSITIVE Sensitive     NITROFURANTOIN 64 INTERMEDIATE Intermediate     TRIMETH/SULFA <=20 SENSITIVE Sensitive     PIP/TAZO <=4 SENSITIVE Sensitive     * 60,000 COLONIES/ml CITROBACTER KOSERI   Escherichia coli - MIC*    AMPICILLIN >=32 RESISTANT Resistant     CEFAZOLIN >=64 RESISTANT Resistant     CEFTRIAXONE >=64 RESISTANT Resistant     CIPROFLOXACIN <=0.25 SENSITIVE Sensitive     GENTAMICIN <=1 SENSITIVE Sensitive     IMIPENEM <=0.25 SENSITIVE Sensitive     NITROFURANTOIN <=16 SENSITIVE Sensitive     TRIMETH/SULFA >=320 RESISTANT Resistant     AMPICILLIN/SULBACTAM >=32 RESISTANT Resistant     PIP/TAZO <=4 SENSITIVE Sensitive     * 50,000 COLONIES/mL ESCHERICHIA COLI  MRSA PCR Screening     Status: None   Collection Time: 01/11/15  2:46 PM  Result Value Ref Range Status   MRSA by PCR NEGATIVE NEGATIVE Final    Comment:        The GeneXpert MRSA Assay (FDA approved for NASAL specimens only), is one component of a comprehensive MRSA colonization surveillance program. It is not intended to diagnose MRSA infection nor to guide or monitor treatment for MRSA infections.   Culture, blood (routine x 2)     Status: None   Collection Time: 01/11/15 10:16 PM  Result Value Ref Range Status   Specimen Description BLOOD LEFT HAND  Final   Special Requests IN PEDIATRIC BOTTLE 3CC  Final   Culture  Setup Time   Final    GRAM POSITIVE COCCI IN CLUSTERS AEROBIC BOTTLE ONLY CRITICAL RESULT CALLED TO, READ BACK BY AND VERIFIED WITH: Farrell Ours RN [id number] 01/12/15 A BROWNING    Culture   Final    STAPHYLOCOCCUS SPECIES (COAGULASE NEGATIVE) THE SIGNIFICANCE OF ISOLATING THIS ORGANISM FROM A SINGLE SET OF BLOOD CULTURES WHEN MULTIPLE SETS ARE DRAWN IS UNCERTAIN. PLEASE NOTIFY THE MICROBIOLOGY DEPARTMENT WITHIN ONE WEEK IF SPECIATION AND SENSITIVITIES ARE  REQUIRED.    Report Status 01/14/2015 FINAL  Final  Culture, blood (routine x 2)     Status: None   Collection Time: 01/11/15 10:22 PM  Result Value Ref Range Status   Specimen Description BLOOD RIGHT ANTECUBITAL  Final   Special Requests IN PEDIATRIC BOTTLE 3CC  Final   Culture NO GROWTH 5 DAYS  Final   Report Status 01/16/2015 FINAL  Final     Studies: No results found.  Scheduled Meds: . feeding supplement (GLUCERNA SHAKE)  237 mL Oral TID BM  . finasteride  5 mg Oral Daily  .  fluticasone  1 spray Each Nare Daily  . irbesartan  300 mg Oral Daily  . levETIRAcetam  500 mg Oral BID  . LORazepam  0.5 mg Intravenous Q4H  .  morphine injection  2 mg Intravenous Q6H  . pantoprazole  40 mg Oral QHS  . risperiDONE  0.5 mg Oral QHS  . sodium chloride  3 mL Intravenous Q12H  . tamsulosin  0.4 mg Oral Daily   Continuous Infusions: . 0.9 % NaCl with KCl 20 mEq / L 50 mL/hr at 01/16/15 1249    Principal Problem:   Acute encephalopathy Active Problems:   Essential hypertension   GBM (glioblastoma multiforme) (HCC)   Anemia of chronic disease   Depression   Controlled type 2 diabetes mellitus without complication, without long-term current use of insulin (HCC)   Anxious mood   Confusion   Iron deficiency   Acute pulmonary embolism (HCC)   Mass, brain   Gross hematuria   Brain tumor ([place])   DNR (do not resuscitate)   Palliative care encounter   Pain, cancer   Shamere Campas, [facility] Pager [phone]. If 7PM-7AM, please contact night-coverage at [email or web address], password [facility] 01/17/2015, 3:59 PM  LOS: 13 days

## 2015-01-18 DIAGNOSIS — F411 Generalized anxiety disorder: Secondary | ICD-10-CM

## 2015-01-18 LAB — CBC
HCT: 36.5 % — ABNORMAL LOW (ref 39.0–52.0)
Hemoglobin: 11.3 g/dL — ABNORMAL LOW (ref 13.0–17.0)
MCH: 29 pg (ref 26.0–34.0)
MCHC: 31 g/dL (ref 30.0–36.0)
MCV: 93.8 fL (ref 78.0–100.0)
PLATELETS: 252 10*3/uL (ref 150–400)
RBC: 3.89 MIL/uL — AB (ref 4.22–5.81)
RDW: 16.4 % — ABNORMAL HIGH (ref 11.5–15.5)
WBC: 10.5 10*3/uL (ref 4.0–10.5)

## 2015-01-18 LAB — GLUCOSE, CAPILLARY
Glucose-Capillary: 160 mg/dL — ABNORMAL HIGH (ref 65–99)
Glucose-Capillary: 251 mg/dL — ABNORMAL HIGH (ref 65–99)

## 2015-01-18 MED ORDER — DIPHENHYDRAMINE HCL 25 MG PO CAPS
25.0000 mg | ORAL_CAPSULE | Freq: Four times a day (QID) | ORAL | Status: DC | PRN
  Administered 2015-01-18: 25 mg via ORAL
  Filled 2015-01-18: qty 1

## 2015-01-18 NOTE — Consult Note (Signed)
Radiation Oncology         [phone] ________________________________  Initial inpatient Consultation  Name: Aaron White MRN: [id number]  Date: 01/04/2015  DOB: 28-Apr-1940  [id number] DAVIDSON, MD  No ref. 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 Brain tumor ([place]) 239.6 D49.6 MR Brain W Wo Contrast     MR Brain W Wo Contrast    HISTORY OF PRESENT ILLNESS::Aaron White is a 75 y.o. male who was experiencing increasing confusion and describing that his brain felt scrambled.  MRI showed a large butterfly tumor of the corpus callosum and biopsy shows anaplastic astrocytoma.  PREVIOUS RADIATION THERAPY: No  PAST MEDICAL HISTORY:  has no past medical history on file.    PAST SURGICAL HISTORY: Past Surgical History  Procedure Laterality Date  . Knee cartilage surgery Left   . Tonsillectomy and adenoidectomy    . Lithotripsy    . Colonoscopy N/A 12/28/2014    Procedure: COLONOSCOPY;  Surgeon: Mauri Pole, MD;  Location: [facility] ENDOSCOPY;  Service: Endoscopy;  Laterality: N/A;  . Pr dural graft repair,spine defect N/A 01/11/2015    Procedure: STERIOTACTIC BIOPSY OF BRAIN WITH Lucky Rathke;  Surgeon: Newman Pies, MD;  Location: [facility] NEURO ORS;  Service: Neurosurgery;  Laterality: N/A;  . Application of cranial navigation N/A 01/11/2015    Procedure: APPLICATION OF CRANIAL NAVIGATION;  Surgeon: Newman Pies, MD;  Location: [facility] NEURO ORS;  Service: Neurosurgery;  Laterality: N/A;    FAMILY HISTORY: family history includes Heart disease in his mother; Stroke in his father.  SOCIAL HISTORY:  Social History   Social History  . Marital Status: Married    Spouse Name: N/A  . Number of Children: 3  . Years of Education: N/A   Occupational History  . Teacher [employer]   Social History Main Topics  . Smoking status:  Never Smoker   . Smokeless tobacco: Never Used  . Alcohol Use: No  . Drug Use: No  . Sexual Activity: Not Currently   Other Topics Concern  . Not on file   Social History Narrative    ALLERGIES: Strawberry flavor and Penicillins  MEDICATIONS:  Current Facility-Administered Medications  Medication Dose Route Frequency Provider Last Rate Last Dose  . acetaminophen (TYLENOL) tablet 650 mg  650 mg Oral Q4H PRN Newman Pies, MD   650 mg at 01/12/15 1248   Or  . acetaminophen (TYLENOL) suppository 650 mg  650 mg Rectal Q4H PRN Newman Pies, MD   650 mg at 01/12/15 0148  . feeding supplement (GLUCERNA SHAKE) (GLUCERNA SHAKE) liquid 237 mL  237 mL Oral TID BM Florencia Reasons, MD   Stopped at 01/18/15 1500  . fluticasone (FLONASE) 50 MCG/ACT nasal spray 1 spray  1 spray Each Nare Daily Reubin Milan, MD   1 spray at 01/18/15 1135  . hydrocortisone (ANUSOL-HC) 2.5 % rectal cream 1 application  1 application Rectal BID PRN Reubin Milan, MD      . levETIRAcetam (KEPPRA) tablet 500 mg  500 mg Oral BID Donne Hazel, MD   500 mg at 01/18/15 1133  . LORazepam (ATIVAN) injection 0.5 mg  0.5 mg Intravenous Q4H Knox Royalty, NP   0.5 mg at 01/18/15 1459  . morphine 2 MG/ML injection 1-2 mg  1-2 mg Intravenous Q2H PRN Newman Pies, MD  2 mg at 01/16/15 2323  . morphine 2 MG/ML injection 2 mg  2 mg Intravenous Q6H Knox Royalty, NP   2 mg at 01/18/15 1459  . ondansetron (ZOFRAN) tablet 4 mg  4 mg Oral Q6H PRN Reubin Milan, MD   4 mg at 01/15/15 1220   Or  . ondansetron [facility]) injection 4 mg  4 mg Intravenous Q6H PRN Reubin Milan, MD      . pantoprazole (PROTONIX) EC tablet 40 mg  40 mg Oral QHS Adrian Saran, RPH   40 mg at 01/17/15 2145  . polyvinyl alcohol (LIQUIFILM TEARS) 1.4 % ophthalmic solution 1 drop  1 drop Both Eyes TID PRN Florencia Reasons, MD   1 drop at 01/07/15 1702  . promethazine (PHENERGAN) tablet 12.5-25 mg  12.5-25 mg Oral Q4H PRN Newman Pies, MD      .  risperiDONE (RISPERDAL) tablet 0.5 mg  0.5 mg Oral QHS Reubin Milan, MD   0.5 mg at 01/17/15 2149  . sodium chloride 0.9 % injection 3 mL  3 mL Intravenous Q12H Reubin Milan, MD   3 mL at 01/18/15 1134    REVIEW OF SYSTEMS:  A 15 point review of systems is documented in the electronic medical record. This was obtained by the nursing staff. However, I reviewed this with the patient to discuss relevant findings and make appropriate changes.  Pertinent items are noted in HPI.   PHYSICAL EXAM:  height is 5\' 11"  (1.803 m) and weight is 190 lb (86.183 kg). His oral temperature is 98.3 F (36.8 C). His blood pressure is 152/69 and his pulse is 102. His respiration is 16 and oxygen saturation is 95%.    Per internal medicine General: Appears calm and comfortable  Eyes: PERRL, normal lids, irises & conjunctiva  ENT: grossly normal hearing, lips & tongue  Neck: no LAD, masses or thyromegaly  Cardiovascular: RRR, no m/r/g. No LE edema.  Telemetry: SR, no arrhythmias   Respiratory: CTA bilaterally, no w/r/r. Normal respiratory effort.  Abdomen: soft, ntnd  Skin: no rash or induration seen on limited exam  Musculoskeletal: grossly normal tone BUE/BLE  Psychiatric: Confused and delusional. Neurologic: Awake alert oriented 2, disoriented to time, otherwise grossly non-focal.   KPS = 40  100 - Normal; no complaints; no evidence of disease. 90   - Able to carry on normal activity; minor signs or symptoms of disease. 80   - Normal activity with effort; some signs or symptoms of disease. 37   - Cares for self; unable to carry on normal activity or to do active work. 60   - Requires occasional assistance, but is able to care for most of his personal needs. 50   - Requires considerable assistance and frequent medical care. 64   - Disabled; requires special care and assistance. 21   - Severely disabled; hospital admission is indicated although death not imminent. 41   - Very sick;  hospital admission necessary; active supportive treatment necessary. 10   - Moribund; fatal processes progressing rapidly. 0     - Dead  Karnofsky DA, Abelmann [place], Craver LS and Burchenal [facility] [phone]) The use of the nitrogen mustards in the palliative treatment of carcinoma: with particular reference to bronchogenic carcinoma Cancer 1 634-56  LABORATORY DATA:  Lab Results  Component Value Date   WBC 10.5 01/18/2015   HGB 11.3* 01/18/2015   HCT 36.5* 01/18/2015   MCV 93.8 01/18/2015   PLT 252 01/18/2015  Lab Results  Component Value Date   NA 141 01/16/2015   K 4.1 01/16/2015   CL 106 01/16/2015   CO2 28 01/16/2015   Lab Results  Component Value Date   ALT 20 01/07/2015   AST 18 01/07/2015   ALKPHOS 46 01/07/2015   BILITOT 1.1 01/07/2015     RADIOGRAPHY: Dg Chest 2 View  12/31/2014  CLINICAL DATA:  75 year old male with altered mental status EXAM: CHEST  2 VIEW COMPARISON:  Prior chest x-ray 04/08/2010 FINDINGS: Cardiac and mediastinal contours remain within normal limits. No focal airspace consolidation, pleural effusion, pulmonary edema or pneumothorax. Stable mild bronchitic change. No acute osseous abnormality. IMPRESSION: No active cardiopulmonary disease. Electronically Signed   By: Jacqulynn Cadet M.D.   On: 12/31/2014 15:44   Ct Head Wo Contrast  01/12/2015  CLINICAL DATA:  Elevated temperature.  Referring biopsy yesterday. EXAM: CT HEAD WITHOUT CONTRAST TECHNIQUE: Contiguous axial images were obtained from the base of the skull through the vertex without intravenous contrast. COMPARISON:  MRI brain 01/10/2015. CT head without contrast 12/31/2014 and MRI brain 01/05/2015. FINDINGS: A right parietal burr hole is noted. There minimal blood products along the biopsy tract, as expected. A mass lesion in the splenium of the corpus callosum is predominantly low-density. There is significantly more mass effect than on the 12/24 CT. No acute blood products are evident within the  lesion. The lesion creates some mass effect on the posterior horns of the lateral ventricles. 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 No other acute infarct is present. There is no significant extra-axial fluid collection. The ventricles are unremarkable otherwise. The calvarium is otherwise intact. The paranasal sinuses and mastoid air cells are clear. The globes and orbits are intact. IMPRESSION: 1. Interval burr hole in the right parietal calvarium minimal blood along the biopsy tract, within normal limits. 2. Bulky low-density mass lesion in the splenium of the corpus callosum. This is most concerning for a GBM. As previously discussed, lymphoma is also considered. Electronically Signed   By: San Morelle M.D.   On: 01/12/2015 06:45   Ct Head Wo Contrast  12/31/2014  CLINICAL DATA:  Worsening confusion EXAM: CT HEAD WITHOUT CONTRAST TECHNIQUE: Contiguous axial images were obtained from the base of the skull through the vertex without intravenous contrast. COMPARISON:  None. FINDINGS: No skull fracture is noted. Paranasal sinuses and mastoid air cells are unremarkable. No intracranial hemorrhage, mass effect or midline shift. No acute cortical infarction. No mass lesion is noted on this unenhanced scan. Mild cerebral atrophy. Tiny lacunar infarct or prominent perivascular space noted in left basal ganglia. IMPRESSION: No acute intracranial abnormality.  Mild cerebral atrophy. Electronically Signed   By: Lahoma Crocker M.D.   On: 12/31/2014 21:46   Ct Chest W Contrast  01/07/2015  CLINICAL DATA:  Confusion. Recently diagnosed with corpus callosal and posterior medial temporal lobe mass. Assess for primary malignancy. Initial encounter. EXAM: CT CHEST, ABDOMEN, AND PELVIS WITH CONTRAST TECHNIQUE: Multidetector CT imaging of the chest, abdomen and pelvis was performed following the standard protocol during bolus administration of intravenous contrast. CONTRAST:  178mL OMNIPAQUE  IOHEXOL 300 MG/ML  SOLN COMPARISON:  Chest radiograph performed 12/31/2014, and CT of the abdomen and pelvis from 04/07/2010 FINDINGS: CT CHEST Minimal bibasilar atelectasis is noted bilaterally. Trace right-sided pleural fluid is noted. The lungs are otherwise clear. No focal consolidation or pneumothorax is seen. No suspicious masses are identified. A small pulmonary embolus is incidentally noted within a  segmental branch to the left lower lobe. The mediastinum is unremarkable in appearance. No mediastinal lymphadenopathy is seen. No pericardial effusion is identified. The great vessels are grossly unremarkable in appearance. There is asymmetric prominence of the right thyroid lobe, which may reflect a 2.7 cm nodule. No axillary lymphadenopathy is appreciated. A moderate hiatal hernia is noted. No acute osseous abnormalities are identified. CT ABDOMEN AND PELVIS A 6 mm hypodensity in the periphery of the right hepatic lobe is nonspecific but may reflect a small cyst. The spleen is unremarkable in appearance. A few stones are seen dependently within the gallbladder. The gallbladder is contracted and otherwise unremarkable. The pancreas and adrenal glands are unremarkable. Several left renal parapelvic cysts are noted. Nonspecific perinephric stranding is noted on the left side. There is no evidence of hydronephrosis. No renal or ureteral stones are seen. No free fluid is identified. The small bowel is unremarkable in appearance. The stomach is within normal limits. No acute vascular abnormalities are seen. Mild calcification is noted along the abdominal aorta and its branches. The appendix is normal in caliber, without evidence of appendicitis. Contrast progresses to the level of the sigmoid colon. Mild scattered diverticulosis is noted along the distal transverse, descending and proximal sigmoid colon. The distal descending and proximal sigmoid colon extend into a moderate left inguinal hernia, without evidence  for obstruction. Extension of the colon into the hernia is more prominent than in 2012. The bladder is mildly distended. There is extension of a 3.1 cm mass into the base of the bladder, thought to arise from the prostate. This is only slightly more prominent than in 2012, and likely reflects the patient's baseline. The prostate is enlarged, measuring 5.3 cm in transverse dimension. No inguinal lymphadenopathy is seen. No acute osseous abnormalities are identified. There is mild grade 1 retrolisthesis of L2 on L3, reflecting underlying facet disease. IMPRESSION: 1. Small pulmonary embolus incidentally noted within a segmental branch to the left lower lung lobe. 2. No suspicious masses seen. 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 3. Extension of 3.1 cm mass into the base of the bladder, thought to arise from the prostate. This is only slightly more prominent than in 2012, and likely reflects the patient's baseline. Enlarged prostate noted. Would correlate with PSA, as deemed clinically appropriate. 4. Extension of the distal descending and proximal sigmoid colon into a moderate left inguinal hernia, without evidence for obstruction. This is somewhat more prominent than in 2012. 5. Trace right-sided pleural fluid, with minimal bibasilar atelectasis. 6. Question of 2.7 cm nodule at the right thyroid lobe. Consider further evaluation with thyroid ultrasound. If patient is clinically hyperthyroid, consider nuclear medicine thyroid uptake and scan. 7. Several left renal parapelvic cysts noted. 6 mm hypodensity in the periphery of the right hepatic lobe is nonspecific but may reflect a small cyst. 8. Cholelithiasis. Gallbladder contracted and otherwise unremarkable. 9. Mild calcification along the abdominal aorta and its branches. 10. Mild scattered diverticulosis along the distal transverse, descending and proximal sigmoid colon. Critical  Value/emergent results were called by telephone at the time of interpretation on 01/07/2015 at 1:26 am to [place] at [facility], who verbally acknowledged these results. Electronically Signed   By: Garald Balding M.D.   On: 01/07/2015 01:28   Mr Brain Wo Contrast  01/05/2015  CLINICAL DATA:  Confusion, worsening over the last several days. EXAM: MRI HEAD WITHOUT CONTRAST TECHNIQUE: Multiplanar, multiecho pulse sequences of the brain and  surrounding structures were obtained without intravenous contrast. COMPARISON:  Head CT 12/31/2014 FINDINGS: The brainstem is normal. There is mild cerebellar atrophy but no focal cerebellar insult. The cerebral hemispheres show a background pattern of chronic small vessel disease affecting the deep white matter. 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 No cortical or large vessel territory infarction. There is a mass lesion infiltrating the splenium of the corpus callosum and extending into the posterior medial temporal lobes bilaterally. There are petechial blood products within the substance of this mass in the region of the splenium to the right of midline. 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 Lymphoma and acute toxic demyelination are considered but felt much less likely. No hydrocephalus. No extra-axial fluid collection. No pituitary mass. Sinuses, middle ears and mastoids are clear. IMPRESSION: Tumor infiltrating the splenium of the corpus callosum and crossing bilaterally into the posterior medial temporal lobes. Some internal blood products. 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 Electronically Signed   By: Nelson Chimes M.D.   On: 01/05/2015 19:00   Mr Jeri Cos [id number] Contrast  01/10/2015  ADDENDUM REPORT: 01/10/2015 20:19 ADDENDUM: After further investigation, patient did receive iron supplement (ferriheme) 01/07/2015 and 01/08/2015 which is felt to be  responsible for the altered signal intensity of the brain mass. Therefore hemorrhage into the mass is felt unlikely as cause for the interval change. This was discussed with the patient's nurse Claiborne Billings. Electronically Signed   By: Genia Del M.D.   On: 01/10/2015 20:19  01/10/2015  CLINICAL DATA:  75 year old hypertensive diabetic male with increasing confusion. Subsequent encounter. EXAM: MRI HEAD WITHOUT AND WITH CONTRAST TECHNIQUE: Multiplanar, multiecho pulse sequences of the brain and surrounding structures were obtained without and with intravenous contrast. CONTRAST:  2mL MULTIHANCE GADOBENATE DIMEGLUMINE 529 MG/ML IV SOLN COMPARISON:  01/05/2015 brain MR. 12/31/2014 head CT. FINDINGS: Bulky irregular mass centered in the splenium of corpus callosum spans over 6.8 x 3.2 x 8 2.8 cm with extension into the posterior medial temporal lobes larger on the right. Marked change in appearance of this lesion on the pre contrast imaging (confirmed with technologist contrast was not given prior to sagittal T1 imaging). The appearance suggests that the patient has bled into the tumor in the interim. Alternatively, the abnormal T1 imaging may be related to recent medication administration, but does not appear to be case after talking with the patient's nurse. Patient received iodinated contrast for inferior vena cava filter placement today however this should not cause this appearance. This could be further assessed on follow-up T1 weighted imaging to determine if this represents artifact or true hemorrhage. The lesion is highly suspicious for a glioblastoma. Lymphoma is a secondary less likely consideration. Demyelinating process felt to be a secondary much less likely consideration. There may be a tiny daughter lesion within the left temporal lobe. No acute infarct. Mild chronic small vessel disease type changes. Global atrophy without hydrocephalus. Major intracranial vascular structures are patent. IMPRESSION: Bulky  lesion within the splenium corpus callosum with extension to the posterior medial type lobes is highly suspicious for glioblastoma with other considerations felt less likely as noted above. When compared to the prior examination, there has been a change in the signal characteristics of this lesion which may be related to interval hemorrhage into lesion (assuming that there is not a confounding factor such as administration of medication which causes T1 hyperintensity and gradient abnormality). This could be further assessed on  follow-up T1 weighted imaging to determine if this represents artifact or true hemorrhage. There may be a tiny daughter lesion within the left temporal lobe. These results were called by telephone at the time of interpretation on 01/10/2015 at 7:29 pm to [facility] patients nurse who verbally acknowledged these results. Electronically Signed: By: Genia Del M.D. On: 01/10/2015 19:32   US Scrotum  01/04/2015  CLINICAL DATA:  Left testicular pain. EXAM: SCROTAL ULTRASOUND DOPPLER ULTRASOUND OF THE TESTICLES TECHNIQUE: Complete ultrasound examination of the testicles, epididymis, and other scrotal structures was performed. Color and spectral Doppler ultrasound were also utilized to evaluate blood flow to the testicles. COMPARISON:  None. FINDINGS: Right testicle Measurements: 5.0 x 2.4 x 3.7 cm. No mass or microlithiasis visualized. Left testicle Measurements: 5.5 x 2.1 x 2.6 cm. Small intra testicular cyst measuring 4 mm maximally. No mass or microlithiasis visualized. Right epididymis:  Normal in size and appearance. Left epididymis:  Normal in size and appearance. Hydrocele:  None visualized. Varicocele:  None visualized. Pulsed Doppler interrogation of both testes demonstrates normal low resistance arterial and venous waveforms bilaterally. IMPRESSION: Small benign-appearing cyst in the left testicle. Otherwise unremarkable study. Electronically Signed   By: Rolm Baptise M.D.   On: 01/04/2015  19:03   Ct Abdomen Pelvis W Contrast  01/07/2015  CLINICAL DATA:  Confusion. Recently diagnosed with corpus callosal and posterior medial temporal lobe mass. Assess for primary malignancy. Initial encounter. EXAM: CT CHEST, ABDOMEN, AND PELVIS WITH CONTRAST TECHNIQUE: Multidetector CT imaging of the chest, abdomen and pelvis was performed following the standard protocol during bolus administration of intravenous contrast. CONTRAST:  178mL OMNIPAQUE IOHEXOL 300 MG/ML  SOLN COMPARISON:  Chest radiograph performed 12/31/2014, and CT of the abdomen and pelvis from 04/07/2010 FINDINGS: CT CHEST Minimal bibasilar atelectasis is noted bilaterally. Trace right-sided pleural fluid is noted. The lungs are otherwise clear. No focal consolidation or pneumothorax is seen. No suspicious masses are identified. A small pulmonary embolus is incidentally noted within a segmental branch to the left lower lobe. The mediastinum is unremarkable in appearance. No mediastinal lymphadenopathy is seen. No pericardial effusion is identified. The great vessels are grossly unremarkable in appearance. There is asymmetric prominence of the right thyroid lobe, which may reflect a 2.7 cm nodule. No axillary lymphadenopathy is appreciated. A moderate hiatal hernia is noted. No acute osseous abnormalities are identified. CT ABDOMEN AND PELVIS A 6 mm hypodensity in the periphery of the right hepatic lobe is nonspecific but may reflect a small cyst. The spleen is unremarkable in appearance. A few stones are seen dependently within the gallbladder. The gallbladder is contracted and otherwise unremarkable. The pancreas and adrenal glands are unremarkable. Several left renal parapelvic cysts are noted. Nonspecific perinephric stranding is noted on the left side. There is no evidence of hydronephrosis. No renal or ureteral stones are seen. No free fluid is identified. The small bowel is unremarkable in appearance. The stomach is within normal limits.  No acute vascular abnormalities are seen. Mild calcification is noted along the abdominal aorta and its branches. The appendix is normal in caliber, without evidence of appendicitis. Contrast progresses to the level of the sigmoid colon. Mild scattered diverticulosis is noted along the distal transverse, descending and proximal sigmoid colon. The distal descending and proximal sigmoid colon extend into a moderate left inguinal hernia, without evidence for obstruction. Extension of the colon into the hernia is more prominent than in 2012. The bladder is mildly distended. There is extension of a 3.1 cm mass  into the base of the bladder, thought to arise from the prostate. This is only slightly more prominent than in 2012, and likely reflects the patient's baseline. The prostate is enlarged, measuring 5.3 cm in transverse dimension. No inguinal lymphadenopathy is seen. No acute osseous abnormalities are identified. There is mild grade 1 retrolisthesis of L2 on L3, reflecting underlying facet disease. IMPRESSION: 1. Small pulmonary embolus incidentally noted within a segmental branch to the left lower lung lobe. 2. No suspicious masses seen. 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 3. Extension of 3.1 cm mass into the base of the bladder, thought to arise from the prostate. This is only slightly more prominent than in 2012, and likely reflects the patient's baseline. Enlarged prostate noted. Would correlate with PSA, as deemed clinically appropriate. 4. Extension of the distal descending and proximal sigmoid colon into a moderate left inguinal hernia, without evidence for obstruction. This is somewhat more prominent than in 2012. 5. Trace right-sided pleural fluid, with minimal bibasilar atelectasis. 6. Question of 2.7 cm nodule at the right thyroid lobe. Consider further evaluation with thyroid ultrasound. If patient is clinically hyperthyroid,  consider nuclear medicine thyroid uptake and scan. 7. Several left renal parapelvic cysts noted. 6 mm hypodensity in the periphery of the right hepatic lobe is nonspecific but may reflect a small cyst. 8. Cholelithiasis. Gallbladder contracted and otherwise unremarkable. 9. Mild calcification along the abdominal aorta and its branches. 10. Mild scattered diverticulosis along the distal transverse, descending and proximal sigmoid colon. Critical Value/emergent results were called by telephone at the time of interpretation on 01/07/2015 at 1:26 am to [place] at [facility], who verbally acknowledged these results. Electronically Signed   By: Garald Balding M.D.   On: 01/07/2015 01:28   Ir Ivc Filter Plmt / S&i /img Guid/mod Sed  01/10/2015  INDICATION: History of pulmonary embolism and lower extremity DVT. Patient with recent MR findings concerning for either GBM or CNS lymphoma. Request made for placement of an IVC filter for (at least temporary) caval interruption. EXAM: ULTRASOUND GUIDANCE FOR VASCULAR ACCESS IVC CATHETERIZATION AND VENOGRAM IVC FILTER INSERTION COMPARISON:  CT the chest, abdomen pelvis - 01/06/2015; brain MRI -01/05/2015 MEDICATIONS: Fentanyl 50 mcg IV ANESTHESIA/SEDATION: Sedation Time 15 minutes CONTRAST:  60 cc Omnipaque 300 FLUOROSCOPY TIME:  48 minutes (81 mGy) COMPLICATIONS: None immediate PROCEDURE: Informed consent was obtained from the patient following explanation of the procedure, risks, benefits and alternatives. The patient understands, agrees and consents for the procedure. All questions were addressed. A time out was performed prior to the initiation of the procedure. Maximal barrier sterile technique utilized including caps, mask, sterile gowns, sterile gloves, large sterile drape, hand hygiene, and Betadine prep. Under sterile condition and local anesthesia, right internal jugular venous access was performed with ultrasound. An ultrasound image was saved and sent to PACS.  Over a guidewire, the IVC filter delivery sheath and inner dilator were advanced into the IVC just above the IVC bifurcation. Contrast injection was performed for an IVC venogram. Through the delivery sheath, a retrievable Denali IVC filter was deployed below the level of the renal veins and above the IVC bifurcation. Limited post deployment venacavagram was performed. The delivery sheath was removed and hemostasis was obtained with manual compression. A dressing was placed. The patient tolerated the procedure well without immediate post procedural complication. FINDINGS: The IVC is patent. No evidence of thrombus, stenosis, or occlusion. No variant venous anatomy. Successful placement of the  IVC filter below the level of the renal veins. IMPRESSION: Successful ultrasound and fluoroscopically guided placement of an infrarenal retrievable IVC filter via right jugular approach. This IVC filter is potentially retrievable. The patient will be assessed for filter retrieval by Interventional Radiology in approximately 8-12 weeks. Further recommendations regarding filter retrieval, continued surveillance or declaration of device permanence, will be made at that time. Electronically Signed   By: Sandi Mariscal M.D.   On: 01/10/2015 14:18   [place] Art/ven Flow Abd Pelv Doppler  01/04/2015  CLINICAL DATA:  Left testicular pain. EXAM: SCROTAL ULTRASOUND DOPPLER ULTRASOUND OF THE TESTICLES TECHNIQUE: Complete ultrasound examination of the testicles, epididymis, and other scrotal structures was performed. Color and spectral Doppler ultrasound were also utilized to evaluate blood flow to the testicles. COMPARISON:  None. FINDINGS: Right testicle Measurements: 5.0 x 2.4 x 3.7 cm. No mass or microlithiasis visualized. Left testicle Measurements: 5.5 x 2.1 x 2.6 cm. Small intra testicular cyst measuring 4 mm maximally. No mass or microlithiasis visualized. Right epididymis:  Normal in size and appearance. Left epididymis:  Normal in  size and appearance. Hydrocele:  None visualized. Varicocele:  None visualized. Pulsed Doppler interrogation of both testes demonstrates normal low resistance arterial and venous waveforms bilaterally. IMPRESSION: Small benign-appearing cyst in the left testicle. Otherwise unremarkable study. Electronically Signed   By: Rolm Baptise M.D.   On: 01/04/2015 19:03   Dg Chest Port 1 View  01/11/2015  CLINICAL DATA:  Fever EXAM: PORTABLE CHEST 1 VIEW COMPARISON:  Chest CT 01/06/2015 FINDINGS: Normal cardiac silhouette. There is diffuse linear interstitial pattern in the lungs. No focal infiltrate. No pneumothorax. IMPRESSION: Interstitial edema pattern. Electronically Signed   By: Suzy Bouchard M.D.   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  He has confusion and poor performance status.  He may be a candidate for radiotherapy versus comfort care.  PLAN:Today, I talked to the patient and family about the findings and work-up thus far.  We discussed the natural history of high grade butterfly gliomas and general treatment, highlighting the role of radiotherapy in the management.  We discussed the available radiation techniques, and focused on the details of logistics and delivery.  We reviewed the anticipated acute and late sequelae associated with radiation in this setting.  The patient was encouraged to ask questions that I answered to the best of my ability.  The patient would like to proceed with radiation and will be scheduled for CT simulation.  I spent 25 minutes minutes face to face with the patient and more than 50% of that time was spent in counseling and/or coordination of care.   ------------------------------------------------  Sheral Apley. Tammi Klippel, M.D.

## 2015-01-18 NOTE — Progress Notes (Signed)
Daily Progress Note   Patient Name: Aaron White       Date: 01/18/2015 DOB: 1940/06/03  Age: 75 y.o. MRN#: [id number] Attending Physician: Bonnielee Haff, MD Primary Care Physician: Horatio Pel, MD Admit Date: 01/04/2015  Reason for Consultation/Follow-up: Establishing goals of care, Hospice Evaluation, Non pain symptom management, Pain control and Psychosocial/spiritual support  Subjective:  -meet today with wife and her two sons from out of town - Continued conversation regarding diagnosis and prognosis, GOC and options.  Family is comfortable with decision for full comfort care.  Interval Events:  -continued slow decline, patient is more comfortable today since implementation  of scheduled medication  Length of Stay: 14 days  Current Medications: Scheduled Meds:  . feeding supplement (GLUCERNA SHAKE)  237 mL Oral TID BM  . finasteride  5 mg Oral Daily  . fluticasone  1 spray Each Nare Daily  . irbesartan  300 mg Oral Daily  . levETIRAcetam  500 mg Oral BID  . LORazepam  0.5 mg Intravenous Q4H  .  morphine injection  2 mg Intravenous Q6H  . pantoprazole  40 mg Oral QHS  . risperiDONE  0.5 mg Oral QHS  . sodium chloride  3 mL Intravenous Q12H  . tamsulosin  0.4 mg Oral Daily    Continuous Infusions:    PRN Meds: acetaminophen **OR** acetaminophen, hydrocortisone, morphine injection, ondansetron **OR** ondansetron (ZOFRAN) IV, polyvinyl alcohol, promethazine  Physical Exam: Physical Exam  Constitutional: He appears well-developed and well-nourished. He appears ill.  Cardiovascular: Normal rate, regular rhythm and normal heart sounds.   Pulmonary/Chest: He has decreased breath sounds in the right lower field and the left lower field.  Neurological: He is  alert.  Skin: Skin is warm and dry. He is not diaphoretic.  Psychiatric: His mood appears anxious. Cognition and memory are impaired.                Vital Signs: BP 156/94 mmHg  Pulse 95  Temp(Src) 98 F (36.7 C) (Oral)  Resp 16  Ht 5\' 11"  (1.803 m)  Wt 86.183 kg (190 lb)  BMI 26.51 kg/m2  SpO2 97% SpO2: SpO2: 97 % O2 Device: O2 Device: Nasal Cannula O2 Flow Rate: O2 Flow Rate (L/min): 2 L/min  Intake/output summary:  Intake/Output Summary (Last 24 hours) at 01/18/15 1242 Last  data filed at 01/18/15 0830  Gross per 24 hour  Intake    420 ml  Output    100 ml  Net    320 ml   LBM: Last BM Date: 01/17/15 Baseline Weight: Weight: 95.255 kg (210 lb) Most recent weight: Weight: 86.183 kg (190 lb)       Palliative Assessment/Data: Flowsheet Rows        Most Recent Value   Intake Tab    Referral Department  Hospitalist   Unit at Time of Referral  Oncology Unit   Palliative Care Primary Diagnosis  Cancer   Date Notified  01/16/15   Palliative Care Type  New Palliative care   Reason for referral  Clarify Goals of Care, Counsel Regarding Hospice   Date of Admission  01/04/15   Date first seen by Palliative Care  01/16/15   # of days Palliative referral response time  0 Day(s)   # of days IP prior to Palliative referral  12   Clinical Assessment    Psychosocial & Spiritual Assessment    Palliative Care Outcomes       Additional Data Reviewed: CBC    Component Value Date/Time   WBC 10.6* 01/16/2015 0350   RBC 3.40* 01/16/2015 0350   HGB 9.9* 01/16/2015 0350   HCT 31.6* 01/16/2015 0350   PLT 232 01/16/2015 0350   MCV 92.9 01/16/2015 0350   MCH 29.1 01/16/2015 0350   MCHC 31.3 01/16/2015 0350   RDW 15.5 01/16/2015 0350   LYMPHSABS 1.3 01/07/2015 0150   MONOABS 0.4 01/07/2015 0150   EOSABS 0.1 01/07/2015 0150   BASOSABS 0.0 01/07/2015 0150    CMP     Component Value Date/Time   NA 141 01/16/2015 0350   K 4.1 01/16/2015 0350   CL 106 01/16/2015 0350   CO2  28 01/16/2015 0350   GLUCOSE 260* 01/16/2015 0350   BUN 13 01/16/2015 0350   CREATININE 0.85 01/16/2015 0350   CALCIUM 8.7* 01/16/2015 0350   PROT 6.1* 01/07/2015 0150   ALBUMIN 3.4* 01/07/2015 0150   AST 18 01/07/2015 0150   ALT 20 01/07/2015 0150   ALKPHOS 46 01/07/2015 0150   BILITOT 1.1 01/07/2015 0150   GFRNONAA >60 01/16/2015 0350   GFRAA >60 01/16/2015 0350       Problem List:  Patient Active Problem List   Diagnosis Date Noted  . DNR (do not resuscitate) 01/17/2015  . Palliative care encounter 01/17/2015  . Pain, cancer   . Altered mental status   . Brain tumor ([place]) 01/11/2015  . Mass, brain   . Gross hematuria   . Anxious mood   . Confusion   . Iron deficiency   . Acute pulmonary embolism ([place])   . 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 Acute encephalopathy 01/06/2015  . Anemia of chronic disease 01/06/2015  . Depression 01/06/2015  . Controlled type 2 diabetes mellitus without complication, without long-term current use of insulin ([place]) 01/06/2015  . Essential hypertension 12/26/2014     Palliative Care Assessment & Plan    1.Code Status:  DNR    Code Status Orders        Start     Ordered   01/14/15 1541  Do not attempt resuscitation (DNR)   Continuous    Question Answer Comment  In the event of cardiac or respiratory ARREST Do not call a "code blue"   In the event of cardiac or respiratory ARREST Do not perform Intubation, CPR, defibrillation or  ACLS   In the event of cardiac or respiratory ARREST Use medication by any route, position, wound care, and other measures to relive pain and suffering. May use oxygen, suction and manual treatment of airway obstruction as needed for comfort.      01/14/15 1540    Code Status History    Date Active Date Inactive Code Status Order ID Comments User Context   01/11/2015  2:40 PM 01/14/2015  3:40 PM Full Code [id number]  Newman Pies, MD Inpatient   01/04/2015 10:40 PM 01/11/2015  2:40 PM Full  Code [id number]  Reubin Milan, MD Inpatient   12/26/2014  4:37 PM 12/28/2014  7:33 PM Full Code [id number]  Waldemar Dickens, MD ED   06/14/2011  8:48 PM 06/16/2011  2:19 PM Full Code [id number]  Clydene Pugh, RN Inpatient    Advance Directive Documentation        Most Recent Value   Type of Advance Directive  Healthcare Power of Attorney   Pre-existing out of facility DNR order (yellow form or pink MOST form)     "MOST" Form in Place?         2. Goals of Care/Additional Recommendations:  Comfort, quality and dignity  Limitations on Scope of Treatment: Full Comfort Care  Desire for further Chaplaincy support:no  Psycho-social Needs: Education on Hospice and Grief/Bereavement Support  3. Symptom Management:      1.Pain/Dyspnea: Morphine 2 mg IV evrey 4 hrs  Scheduled      2. Agitation: Ativan 0.5 mg IV every 6 hrs scheduled      3. Urinary incontinence: place foley per family request and to enhance comfort  4. Palliative Prophylaxis:   Aspiration, Bowel Regimen, Delirium Protocol, Frequent Pain Assessment and Oral Care  5. Prognosis: weeks  6. Discharge Planning:  Hospice facility: family is requesting [facility].  Focus is full comfort, po intake continues to decline, antibiotics stopped and WBC elevated.  Second MRI shows some progression of disease and blled into the area worsening prognosis.   Care plan was discussed with Dr Curly Rim  Thank you for allowing the Palliative Medicine Team to assist in the care of this patient.   Time In:  1025 Time Out: 1100 Total Time 35 min Prolonged Time Billed  no         Knox Royalty, NP  01/18/2015, 12:42 PM  Please contact Palliative Medicine Team phone at [phone] for questions and concerns.

## 2015-01-18 NOTE — Progress Notes (Signed)
CSW continuing to follow.   CSW followed up on recommendations from [place] meetings with pt family. Palliative Care placed consult to CSW for evaluation for residential hospice placement.   CSW met with pt wife and pt son at bedside. CSW provided support and discussed recommendation for evaluation for residential hospice. CSW offered choice. Pt wife states that [facility] would be first choice and [facility] would be second choice, but ultimately pt wife eager for pt to transition to either hospice home for comfort care and end of life care. Support provided to pt family.   CSW made referral to [facility] liaison, Orbie Pyo who reports that pt information will be reviewed and facility will update CSW regarding eligibility and availability.   CSW made referral to [facility], Erling Conte and [employer] will also review information and notify CSW once referral processed.  CSW to continue to follow to provide support and assist with pt disposition needs.  Alison Murray, MSW, [place] [facility] [phone]

## 2015-01-18 NOTE — Progress Notes (Signed)
Physical Therapy Treatment & discharge Patient Details Name: Aaron White MRN: [id number] DOB: 1940-06-05 Today's Date: 01/18/2015    History of Present Illness 75 yo male admitted with AMS. Hx of HTN,DM, sleep apnea, bil knee OA. Dx: brain tumor    PT Comments    Pt's wife and sons present. Pt much weaker today compared to 2 days ago when he could stand and perform SPT with MOD A.  Today, pt required MOD A for bed mobility and unable to scoot or clear buttocks from sitting surface with MAX A.  Pt very fatigued from sitting EOB and asleep at end of session. Due to rapid functional decline and now d/c plan being residential hospice, feel that pt no longer would benefit from PT services and will d/c.   Follow Up Recommendations  Other (comment) (Residential hospice)     Equipment Recommendations  None recommended by PT    Recommendations for Other Services       Precautions / Restrictions Precautions Precautions: Fall Restrictions Weight Bearing Restrictions: No    Mobility  Bed Mobility Overal bed mobility: Needs Assistance Bed Mobility: Supine to Sit     Supine to sit: Mod assist Sit to supine: Mod assist   General bed mobility comments: MOD A for legs and trunk.  A to get hips scooted forward, unable to coordinate  Transfers Overall transfer level: Needs assistance   Transfers: Lateral/Scoot Transfers          Lateral/Scoot Transfers: Total assist General transfer comment: attempted lateral scooting on EOB and pt unable to perform  Ambulation/Gait                 Stairs            Wheelchair Mobility    Modified Rankin (Stroke Patients Only)       Balance   Sitting-balance support: Feet unsupported Sitting balance-Leahy Scale: Fair Sitting balance - Comments: tactile cueing to put feet on ground and to keep them there. poor coordination     Standing balance-Leahy Scale: Zero Standing balance comment: unable                     Cognition Arousal/Alertness: Lethargic Behavior During Therapy: WFL for tasks assessed/performed Overall Cognitive Status: Impaired/Different from baseline         Following Commands: Follows one step commands inconsistently;Follows one step commands with increased time Safety/Judgement: Decreased awareness of safety   Problem Solving: Slow processing;Decreased initiation;Difficulty sequencing;Requires verbal cues;Requires tactile cues      Exercises      General Comments        Pertinent Vitals/Pain Pain Assessment: No/denies pain    Home Living                      Prior Function            PT Goals (current goals can now be found in the care plan section) Acute Rehab PT Goals PT Goal Formulation: Patient unable to participate in goal setting Potential to Achieve Goals: Poor Progress towards PT goals: Not progressing toward goals - comment    Frequency       PT Plan Discharge plan needs to be updated    Co-evaluation             End of Session Equipment Utilized During Treatment: Gait belt Activity Tolerance: Patient limited by fatigue;Patient limited by lethargy Patient left: in bed;with call bell/phone within reach;with bed alarm  set;with family/visitor present     Time: [id number] PT Time Calculation (min) (ACUTE ONLY): 22 min  Charges:  $Therapeutic Activity: 8-22 mins                    G Codes:      Alyha Marines LUBECK 01/18/2015, 2:18 PM

## 2015-01-18 NOTE — Progress Notes (Signed)
[facility] PROGRESS NOTE  Aaron White [id number] DOB: Feb 28, 1940 DOA: 01/04/2015  PCP: Horatio Pel, MD  Brief HPI: 75 y.o. male with past medical history significant for hypertension, diabetes, obstructive sleep apnea on CPAP, recently hospitalized for GI bleed who presented to [facility] long hospital because of worsening confusion over past few days prior to this admission. Per patient's family, ever since discharge 12/26/2014 patient was not really himself. He was admitted for further evaluation of acute encephalopathy. 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 Neurosurgery, radiation oncology and oncology consulted. Subsequently seen by palliative medicine. Now he is comfort care.  Consultants: Neurosurgery, radiation oncology and oncology consulted. Subsequently seen by palliative medicine.  Procedures:   Stereotactic brain biopsy 01/11/2015  Antibiotics: None currently. He was on imipenem ceftriaxone previously.  Subjective: Patient pleasantly confused. Not able to answer any questions.  Objective: Vital Signs  Filed Vitals:   01/17/15 0456 01/17/15 1331 01/17/15 2100 01/18/15 0500  BP: 151/69 167/88 155/82 156/94  Pulse: 71 102 91 95  Temp: 98.4 F (36.9 C) 97.8 F (36.6 C) 98.8 F (37.1 C) 98 F (36.7 C)  TempSrc: Oral Oral Oral Oral  Resp: 18 18 18 16   Height:      Weight:      SpO2: 92% 94% 96% 97%    Intake/Output Summary (Last 24 hours) at 01/18/15 1328 Last data filed at 01/18/15 0830  Gross per 24 hour  Intake    420 ml  Output    100 ml  Net    320 ml   Filed Weights   01/04/15 2059 01/12/15 1759  Weight: 95.255 kg (210 lb) 86.183 kg (190 lb)    General appearance: alert, distracted and no distress Resp: clear to auscultation bilaterally Cardio:  regular rate and rhythm, S1, S2 normal, no murmur, click, rub or gallop GI: soft, non-tender; bowel sounds normal; no masses,  no organomegaly Extremities: extremities normal, atraumatic, no cyanosis or edema Neurologic: Alert. Distracted. Moving all his extremities. No obvious focal deficits. Confused.  Lab Results:  Basic Metabolic Panel:  Recent Labs Lab 01/16/15 0350  NA 141  K 4.1  CL 106  CO2 28  GLUCOSE 260*  BUN 13  CREATININE 0.85  CALCIUM 8.7*   CBC:  Recent Labs Lab 01/12/15 0255 01/14/15 0304 01/15/15 0414 01/16/15 0350 01/18/15 1233  WBC 10.9* 8.0 12.2* 10.6* 10.5  HGB 9.0* 9.0* 9.9* 9.9* 11.3*  HCT 28.6* 28.5* 31.3* 31.6* 36.5*  MCV 91.1 92.2 92.6 92.9 93.8  PLT 197 177 250 232 252    CBG:  Recent Labs Lab 01/17/15 1138 01/17/15 1710 01/17/15 2115 01/18/15 0752 01/18/15 1107  GLUCAP 232* 168* 249* 160* 251*    Recent Results (from the past 240 hour(s))  Culture, Urine     Status: None   Collection Time: 01/10/15  2:32 PM  Result Value Ref Range Status   Specimen Description URINE, RANDOM  Final   Special Requests NONE  Final   Culture   Final    60,000 COLONIES/ml CITROBACTER KOSERI 50,000 COLONIES/mL ESCHERICHIA COLI Confirmed Extended Spectrum Beta-Lactamase Producer (ESBL) Performed at [facility]    Report Status 01/13/2015 FINAL  Final   Organism ID, Bacteria CITROBACTER KOSERI  Final   Organism ID, Bacteria ESCHERICHIA COLI  Final      Susceptibility   Citrobacter koseri - MIC*  CEFAZOLIN <=4 SENSITIVE Sensitive     CEFTRIAXONE <=1 SENSITIVE Sensitive     CIPROFLOXACIN <=0.25 SENSITIVE Sensitive     GENTAMICIN <=1 SENSITIVE Sensitive     IMIPENEM <=0.25 SENSITIVE Sensitive     NITROFURANTOIN 64 INTERMEDIATE Intermediate     TRIMETH/SULFA <=20 SENSITIVE Sensitive     PIP/TAZO <=4 SENSITIVE Sensitive     * 60,000 COLONIES/ml CITROBACTER KOSERI   Escherichia coli - MIC*    AMPICILLIN >=32 RESISTANT Resistant      CEFAZOLIN >=64 RESISTANT Resistant     CEFTRIAXONE >=64 RESISTANT Resistant     CIPROFLOXACIN <=0.25 SENSITIVE Sensitive     GENTAMICIN <=1 SENSITIVE Sensitive     IMIPENEM <=0.25 SENSITIVE Sensitive     NITROFURANTOIN <=16 SENSITIVE Sensitive     TRIMETH/SULFA >=320 RESISTANT Resistant     AMPICILLIN/SULBACTAM >=32 RESISTANT Resistant     PIP/TAZO <=4 SENSITIVE Sensitive     * 50,000 COLONIES/mL ESCHERICHIA COLI  MRSA PCR Screening     Status: None   Collection Time: 01/11/15  2:46 PM  Result Value Ref Range Status   MRSA by PCR NEGATIVE NEGATIVE Final    Comment:        The GeneXpert MRSA Assay (FDA approved for NASAL specimens only), is one component of a comprehensive MRSA colonization surveillance program. It is not intended to diagnose MRSA infection nor to guide or monitor treatment for MRSA infections.   Culture, blood (routine x 2)     Status: None   Collection Time: 01/11/15 10:16 PM  Result Value Ref Range Status   Specimen Description BLOOD LEFT HAND  Final   Special Requests IN PEDIATRIC BOTTLE 3CC  Final   Culture  Setup Time   Final    GRAM POSITIVE COCCI IN CLUSTERS AEROBIC BOTTLE ONLY CRITICAL RESULT CALLED TO, READ BACK BY AND VERIFIED WITH: Farrell Ours RN [id number] 01/12/15 A BROWNING    Culture   Final    STAPHYLOCOCCUS SPECIES (COAGULASE NEGATIVE) THE SIGNIFICANCE OF ISOLATING THIS ORGANISM FROM A SINGLE SET OF BLOOD CULTURES WHEN MULTIPLE SETS ARE DRAWN IS UNCERTAIN. PLEASE NOTIFY THE MICROBIOLOGY DEPARTMENT WITHIN ONE WEEK IF SPECIATION AND SENSITIVITIES ARE REQUIRED.    Report Status 01/14/2015 FINAL  Final  Culture, blood (routine x 2)     Status: None   Collection Time: 01/11/15 10:22 PM  Result Value Ref Range Status   Specimen Description BLOOD RIGHT ANTECUBITAL  Final   Special Requests IN PEDIATRIC BOTTLE 3CC  Final   Culture NO GROWTH 5 DAYS  Final   Report Status 01/16/2015 FINAL  Final      Studies/Results: No results found.  Medications:    Scheduled: . feeding supplement (GLUCERNA SHAKE)  237 mL Oral TID BM  . fluticasone  1 spray Each Nare Daily  . levETIRAcetam  500 mg Oral BID  . LORazepam  0.5 mg Intravenous Q4H  .  morphine injection  2 mg Intravenous Q6H  . pantoprazole  40 mg Oral QHS  . risperiDONE  0.5 mg Oral QHS  . sodium chloride  3 mL Intravenous Q12H   Continuous:  [id number] **OR** acetaminophen, hydrocortisone, morphine injection, ondansetron **OR** ondansetron (ZOFRAN) IV, polyvinyl alcohol, promethazine  Assessment/Plan:  Principal Problem:   Acute encephalopathy Active Problems:   Essential hypertension   GBM (glioblastoma multiforme) (HCC)   Anemia of chronic disease   Depression   Controlled type 2 diabetes mellitus without complication, without long-term current use of insulin (HCC)   Anxious mood  Confusion   Iron deficiency   Acute pulmonary embolism (HCC)   Mass, brain   Gross hematuria   Brain tumor ([place])   DNR (do not resuscitate)   Palliative care encounter   Pain, cancer   Altered mental status    Acute encephalopathy secondary to Glioblastoma multiforme  - MRI brain with findings of tumor infiltrating the splenium of the corpus callosum and crossing bilaterally into the posterior medial temporal lobes - Neurosurgery, radiation oncology and oncology were consulted. - Dr. Arnoldo Morale of neurosurgerywas consulted and patient is status post biopsy on 1/4 - S/P stereotactic brain biopsy on 01/11/2015. He tolerated the procedure well there were no complications.  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  - Input obtained from Dr. Marin Olp noted. He felt that patient's encephalopathy precludes active treatment. It was thought to be reasonable to pursue hospice care. Decadron stopped secondary to confusion.  - Palliative Care consulted and appreciate input. Patient and family have decided on focus on full comfort and to allow a natural death without life-prolonging  measures, chemo, or radiation. - Palliative Care to re-assess to determine if patient will qualify for residential hospice, which would be ideal given the circumstances  PE/DVT:  -CT scan of lungs with IV contrast performed on 01/06/2015 showing small pulmonary embolus noted within a segmental branch of the left lower lobe. Bilateral lower extremity Dopplers performed on 01/08/2015 showing acute DVT involving left posterior tibial vein. -Started on heparin drip on admission, Heparin drip was stopped on 1/3 due to new onset of gross hematuria, now on lovenox -IVC filter placed by IR on 1/3 at [facility]  Gross hematuria/3.1 cm bladder vs prostate mass:  -Heparin drip stopped -CT findings of prostate likely a benign etiology. Urology recommending cystoscopy in the outpatient setting. No further interventions during this hospitalization recommended.  Anemia of chronic disease and iron deficiency anemia and blood loss anemia for hematuria, dose has h/o gi bleed from hemorrhoids/diverticula, stool occult blood this admission negative. - Recent GI bleed and colonoscopy 12/28/2014 with finding of hemorrhoids - Aspirin placed on hold, Continue protonix daily  - Transfuse 1 unit PRBC on 12/30, 1uint on 1/3 -s/p iv iron , stool occult blood daily  Frequent urination and prostate pain -Enlarged prostate on ct ab: ua unremarkable, psa 4.3 on 12/29 and 2.69 on 1/1 , started flomax, urology recommended outpatient cystoscopy when stable. Focus is now on comfort care.  Essential hypertension - Stable.  Controlled diabetes mellitus type 2 without complications without long-term insulin use - comfort measures - Had been on ssi and lantus, now d/c'd  Depression/anxiety - now focus on comfort  DVT Prophylaxis: Comfort care    Code Status: DO NOT RESUSCITATE  Family Communication: No family at bedside  Disposition Plan: Palliative medicine to consider possible disposition to residential  hospice.    LOS: 14 days   [facility] Pager [phone] 01/18/2015, 1:28 PM  If 7PM-7AM, please contact night-coverage at [email or web address], password [facility]

## 2015-01-19 MED ORDER — LIP MEDEX EX OINT
TOPICAL_OINTMENT | CUTANEOUS | Status: AC
  Administered 2015-01-19: 12:00:00
  Filled 2015-01-19: qty 7

## 2015-01-19 MED ORDER — MORPHINE SULFATE (PF) 2 MG/ML IV SOLN: 2.0000 mg | Freq: Four times a day (QID) | INTRAVENOUS | Status: AC

## 2015-01-19 MED ORDER — LEVETIRACETAM 500 MG PO TABS: 500.0000 mg | ORAL_TABLET | Freq: Two times a day (BID) | ORAL | Status: AC

## 2015-01-19 MED ORDER — LORAZEPAM 2 MG/ML IJ SOLN: 1.0000 mg | INTRAMUSCULAR | Status: AC

## 2015-01-19 MED ORDER — MORPHINE SULFATE (PF) 2 MG/ML IV SOLN: 1.0000 mg | INTRAVENOUS | Status: AC | PRN

## 2015-01-19 MED ORDER — RISPERIDONE 0.5 MG PO TABS: 0.5000 mg | ORAL_TABLET | Freq: Every day | ORAL | Status: AC

## 2015-01-19 MED ORDER — LORAZEPAM 2 MG/ML IJ SOLN
1.0000 mg | Freq: Once | INTRAMUSCULAR | Status: AC
  Administered 2015-01-19: 1 mg via INTRAVENOUS

## 2015-01-19 MED ORDER — DIPHENHYDRAMINE HCL 25 MG PO CAPS: 25.0000 mg | ORAL_CAPSULE | Freq: Four times a day (QID) | ORAL | Status: AC | PRN

## 2015-01-19 MED ORDER — LORAZEPAM 2 MG/ML IJ SOLN: 1.0000 mg | INTRAMUSCULAR | Status: AC | PRN

## 2015-01-19 MED ORDER — LORAZEPAM 2 MG/ML IJ SOLN: 1.0000 mg | INTRAMUSCULAR | Status: DC | PRN

## 2015-01-19 MED ORDER — LORAZEPAM 2 MG/ML IJ SOLN
INTRAMUSCULAR | Status: AC
  Filled 2015-01-19: qty 1

## 2015-01-19 MED ORDER — LORAZEPAM 2 MG/ML IJ SOLN
1.0000 mg | INTRAMUSCULAR | Status: DC
  Administered 2015-01-19: 1 mg via INTRAVENOUS
  Filled 2015-01-19: qty 1

## 2015-01-19 NOTE — Discharge Summary (Signed)
[facility]  Physician Discharge Summary   Patient ID: Aaron White MRN: [id number] DOB/AGE: 09/26/1940 75 y.o.  Admit date: 01/04/2015 Discharge date: 01/19/2015  PCP: Horatio Pel, MD  DISCHARGE DIAGNOSES:  Principal Problem:   GBM (glioblastoma multiforme) ([place]) Active Problems:   Essential hypertension   Acute encephalopathy   Anemia of chronic disease   Depression   Controlled type 2 diabetes mellitus without complication, without long-term current use of insulin (HCC)   Anxious mood   Confusion   Iron deficiency   Acute pulmonary embolism (HCC)   Mass, brain   Gross hematuria   Brain tumor ([place])   DNR (do not resuscitate)   Palliative care encounter   Pain, cancer   Altered mental status   Anxiety state   RECOMMENDATIONS FOR OUTPATIENT FOLLOW UP: 1. Main focus is comfort care   DISCHARGE CONDITION: poor  Diet recommendation: Comfort feeds as tolerated  Filed Weights   01/04/15 2059 01/12/15 1759  Weight: 95.255 kg (210 lb) 86.183 kg (190 lb)    INITIAL HISTORY: 75 y.o. male with past medical history significant for hypertension, diabetes, obstructive sleep apnea on CPAP, recently hospitalized for GI bleed who presented to [facility] because of worsening confusion over past few days prior to this admission. Per patient's family, ever since discharge 12/26/2014 patient was not really himself. He was admitted for further evaluation of acute encephalopathy. 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 Neurosurgery, radiation oncology and oncology consulted. Subsequently seen by palliative medicine. Now he is comfort care.  Consultants: Neurosurgery, radiation oncology and oncology consulted. Subsequently seen by palliative medicine.  Procedures:    Stereotactic brain biopsy 01/11/2015  HOSPITAL COURSE:   Glioblastoma multiforme  - MRI brain with findings of tumor infiltrating the splenium of the corpus callosum and crossing bilaterally into the posterior medial temporal lobes - Neurosurgery, radiation oncology and oncology were consulted. - Dr. Arnoldo Morale of neurosurgerywas consulted and patient is status post biopsy on 1/4 - S/P stereotactic brain biopsy on 01/11/2015. He tolerated the procedure well there were no complications.  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  - Input obtained from Dr. Marin Olp noted. He felt that patient's encephalopathy precluded active treatment. It was thought to be reasonable to pursue hospice care. Decadron stopped secondary to confusion.  - Palliative Care consulted. Patient and family have decided on focus on full comfort and to allow a natural death without life-prolonging measures, chemo, or radiation. Residential hospice was recommended.  PE/DVT:  -CT scan of lungs with IV contrast performed on 01/06/2015 showing small pulmonary embolus noted within a segmental branch of the left lower lobe. Bilateral lower extremity Dopplers performed on 01/08/2015 showing acute DVT involving left posterior tibial vein. -Started on heparin drip on admission, Heparin drip was stopped on 1/3 due to new onset of gross hematuria, now on lovenox -IVC filter placed by IR on 1/3 at [place].  Gross hematuria/3.1 cm bladder vs prostate mass:  -Heparin drip stopped -CT findings of prostate likely a benign etiology. Urology recommending cystoscopy in the outpatient setting. No further interventions during this hospitalization recommended. Now focus is on comfort care.  Anemia of chronic disease and iron deficiency anemia and blood loss anemia for hematuria, dose has h/o gi bleed from hemorrhoids/diverticula, stool occult blood this admission negative. - Recent GI bleed and colonoscopy 12/28/2014 with  finding of  hemorrhoids - Aspirin placed on hold, Continue protonix daily  - Transfused 1 unit PRBC on 12/30, 1uint on 1/3 - s/p iv iron   Frequent urination and prostate pain -Enlarged prostate on ct ab: ua unremarkable, psa 4.3 on 12/29 and 2.69 on 1/1 , started flomax, urology recommended outpatient cystoscopy when stable. Focus is now on comfort care.  Essential hypertension - Stable.  Controlled diabetes mellitus type 2 without complications without long-term insulin use - comfort measures - Had been on ssi and lantus, now d/c'd due to comfort care  Depression/anxiety - now focus on comfort  Patient had been agitated over the course of the night. He had to be given Ativan with good results. His brother was at the bedside today. Patient's life expectancy is few weeks at the most. Probably less than 3 weeks, if he doesn't eat. He has been accepted to residential hospice at [facility]. Okay for discharge today.   PERTINENT LABS:  The results of significant diagnostics from this hospitalization (including imaging, microbiology, ancillary and laboratory) are listed below for reference.    Microbiology: Recent Results (from the past 240 hour(s))  Culture, Urine     Status: None   Collection Time: 01/10/15  2:32 PM  Result Value Ref Range Status   Specimen Description URINE, RANDOM  Final   Special Requests NONE  Final   Culture   Final    60,000 COLONIES/ml CITROBACTER KOSERI 50,000 COLONIES/mL ESCHERICHIA COLI Confirmed Extended Spectrum Beta-Lactamase Producer (ESBL) Performed at [facility]    Report Status 01/13/2015 FINAL  Final   Organism ID, Bacteria CITROBACTER KOSERI  Final   Organism ID, Bacteria ESCHERICHIA COLI  Final      Susceptibility   Citrobacter koseri - MIC*    CEFAZOLIN <=4 SENSITIVE Sensitive     CEFTRIAXONE <=1 SENSITIVE Sensitive     CIPROFLOXACIN <=0.25 SENSITIVE Sensitive     GENTAMICIN <=1 SENSITIVE Sensitive     IMIPENEM <=0.25  SENSITIVE Sensitive     NITROFURANTOIN 64 INTERMEDIATE Intermediate     TRIMETH/SULFA <=20 SENSITIVE Sensitive     PIP/TAZO <=4 SENSITIVE Sensitive     * 60,000 COLONIES/ml CITROBACTER KOSERI   Escherichia coli - MIC*    AMPICILLIN >=32 RESISTANT Resistant     CEFAZOLIN >=64 RESISTANT Resistant     CEFTRIAXONE >=64 RESISTANT Resistant     CIPROFLOXACIN <=0.25 SENSITIVE Sensitive     GENTAMICIN <=1 SENSITIVE Sensitive     IMIPENEM <=0.25 SENSITIVE Sensitive     NITROFURANTOIN <=16 SENSITIVE Sensitive     TRIMETH/SULFA >=320 RESISTANT Resistant     AMPICILLIN/SULBACTAM >=32 RESISTANT Resistant     PIP/TAZO <=4 SENSITIVE Sensitive     * 50,000 COLONIES/mL ESCHERICHIA COLI  MRSA PCR Screening     Status: None   Collection Time: 01/11/15  2:46 PM  Result Value Ref Range Status   MRSA by PCR NEGATIVE NEGATIVE Final    Comment:        The GeneXpert MRSA Assay (FDA approved for NASAL specimens only), is one component of a comprehensive MRSA colonization surveillance program. It is not intended to diagnose MRSA infection nor to guide or monitor treatment for MRSA infections.   Culture, blood (routine x 2)     Status: None   Collection Time: 01/11/15 10:16 PM  Result Value Ref Range Status   Specimen Description BLOOD LEFT HAND  Final   Special Requests IN PEDIATRIC BOTTLE 3CC  Final   Culture  Setup Time  Final    GRAM POSITIVE COCCI IN CLUSTERS AEROBIC BOTTLE ONLY CRITICAL RESULT CALLED TO, READ BACK BY AND VERIFIED WITH: Farrell Ours RN [id number] 01/12/15 A BROWNING    Culture   Final    STAPHYLOCOCCUS SPECIES (COAGULASE NEGATIVE) THE SIGNIFICANCE OF ISOLATING THIS ORGANISM FROM A SINGLE SET OF BLOOD CULTURES WHEN MULTIPLE SETS ARE DRAWN IS UNCERTAIN. PLEASE NOTIFY THE MICROBIOLOGY DEPARTMENT WITHIN ONE WEEK IF SPECIATION AND SENSITIVITIES ARE REQUIRED.    Report Status 01/14/2015 FINAL  Final  Culture, blood (routine x 2)     Status: None   Collection Time: 01/11/15 10:22 PM  Result  Value Ref Range Status   Specimen Description BLOOD RIGHT ANTECUBITAL  Final   Special Requests IN PEDIATRIC BOTTLE 3CC  Final   Culture NO GROWTH 5 DAYS  Final   Report Status 01/16/2015 FINAL  Final     Labs: Basic Metabolic Panel:  Recent Labs Lab 01/16/15 0350  NA 141  K 4.1  CL 106  CO2 28  GLUCOSE 260*  BUN 13  CREATININE 0.85  CALCIUM 8.7*   CBC:  Recent Labs Lab 01/14/15 0304 01/15/15 0414 01/16/15 0350 01/18/15 1233  WBC 8.0 12.2* 10.6* 10.5  HGB 9.0* 9.9* 9.9* 11.3*  HCT 28.5* 31.3* 31.6* 36.5*  MCV 92.2 92.6 92.9 93.8  PLT 177 250 232 252    CBG:  Recent Labs Lab 01/17/15 1138 01/17/15 1710 01/17/15 2115 01/18/15 0752 01/18/15 1107  GLUCAP 232* 168* 249* 160* 251*     IMAGING STUDIES Dg Chest 2 View  12/31/2014  CLINICAL DATA:  75 year old male with altered mental status EXAM: CHEST  2 VIEW COMPARISON:  Prior chest x-ray 04/08/2010 FINDINGS: Cardiac and mediastinal contours remain within normal limits. No focal airspace consolidation, pleural effusion, pulmonary edema or pneumothorax. Stable mild bronchitic change. No acute osseous abnormality. IMPRESSION: No active cardiopulmonary disease. Electronically Signed   By: Jacqulynn Cadet M.D.   On: 12/31/2014 15:44   Ct Head Wo Contrast  01/12/2015  CLINICAL DATA:  Elevated temperature.  Referring biopsy yesterday. EXAM: CT HEAD WITHOUT CONTRAST TECHNIQUE: Contiguous axial images were obtained from the base of the skull through the vertex without intravenous contrast. COMPARISON:  MRI brain 01/10/2015. CT head without contrast 12/31/2014 and MRI brain 01/05/2015. FINDINGS: A right parietal burr hole is noted. There minimal blood products along the biopsy tract, as expected. A mass lesion in the splenium of the corpus callosum is predominantly low-density. There is significantly more mass effect than on the 12/24 CT. No acute blood products are evident within the lesion. The lesion creates some mass  effect on the posterior horns of the lateral ventricles. 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 No other acute infarct is present. There is no significant extra-axial fluid collection. The ventricles are unremarkable otherwise. The calvarium is otherwise intact. The paranasal sinuses and mastoid air cells are clear. The globes and orbits are intact. IMPRESSION: 1. Interval burr hole in the right parietal calvarium minimal blood along the biopsy tract, within normal limits. 2. Bulky low-density mass lesion in the splenium of the corpus callosum. This is most concerning for a GBM. As previously discussed, lymphoma is also considered. Electronically Signed   By: San Morelle M.D.   On: 01/12/2015 06:45   Ct Head Wo Contrast  12/31/2014  CLINICAL DATA:  Worsening confusion EXAM: CT HEAD WITHOUT CONTRAST TECHNIQUE: Contiguous axial images were obtained from the base of the skull through the vertex without intravenous  contrast. COMPARISON:  None. FINDINGS: No skull fracture is noted. Paranasal sinuses and mastoid air cells are unremarkable. No intracranial hemorrhage, mass effect or midline shift. No acute cortical infarction. No mass lesion is noted on this unenhanced scan. Mild cerebral atrophy. Tiny lacunar infarct or prominent perivascular space noted in left basal ganglia. IMPRESSION: No acute intracranial abnormality.  Mild cerebral atrophy. Electronically Signed   By: Lahoma Crocker M.D.   On: 12/31/2014 21:46   Ct Chest W Contrast  01/07/2015  CLINICAL DATA:  Confusion. Recently diagnosed with corpus callosal and posterior medial temporal lobe mass. Assess for primary malignancy. Initial encounter. EXAM: CT CHEST, ABDOMEN, AND PELVIS WITH CONTRAST TECHNIQUE: Multidetector CT imaging of the chest, abdomen and pelvis was performed following the standard protocol during bolus administration of intravenous contrast. CONTRAST:  134mL OMNIPAQUE IOHEXOL 300 MG/ML  SOLN COMPARISON:   Chest radiograph performed 12/31/2014, and CT of the abdomen and pelvis from 04/07/2010 FINDINGS: CT CHEST Minimal bibasilar atelectasis is noted bilaterally. Trace right-sided pleural fluid is noted. The lungs are otherwise clear. No focal consolidation or pneumothorax is seen. No suspicious masses are identified. A small pulmonary embolus is incidentally noted within a segmental branch to the left lower lobe. The mediastinum is unremarkable in appearance. No mediastinal lymphadenopathy is seen. No pericardial effusion is identified. The great vessels are grossly unremarkable in appearance. There is asymmetric prominence of the right thyroid lobe, which may reflect a 2.7 cm nodule. No axillary lymphadenopathy is appreciated. A moderate hiatal hernia is noted. No acute osseous abnormalities are identified. CT ABDOMEN AND PELVIS A 6 mm hypodensity in the periphery of the right hepatic lobe is nonspecific but may reflect a small cyst. The spleen is unremarkable in appearance. A few stones are seen dependently within the gallbladder. The gallbladder is contracted and otherwise unremarkable. The pancreas and adrenal glands are unremarkable. Several left renal parapelvic cysts are noted. Nonspecific perinephric stranding is noted on the left side. There is no evidence of hydronephrosis. No renal or ureteral stones are seen. No free fluid is identified. The small bowel is unremarkable in appearance. The stomach is within normal limits. No acute vascular abnormalities are seen. Mild calcification is noted along the abdominal aorta and its branches. The appendix is normal in caliber, without evidence of appendicitis. Contrast progresses to the level of the sigmoid colon. Mild scattered diverticulosis is noted along the distal transverse, descending and proximal sigmoid colon. The distal descending and proximal sigmoid colon extend into a moderate left inguinal hernia, without evidence for obstruction. Extension of the colon  into the hernia is more prominent than in 2012. The bladder is mildly distended. There is extension of a 3.1 cm mass into the base of the bladder, thought to arise from the prostate. This is only slightly more prominent than in 2012, and likely reflects the patient's baseline. The prostate is enlarged, measuring 5.3 cm in transverse dimension. No inguinal lymphadenopathy is seen. No acute osseous abnormalities are identified. There is mild grade 1 retrolisthesis of L2 on L3, reflecting underlying facet disease. IMPRESSION: 1. Small pulmonary embolus incidentally noted within a segmental branch to the left lower lung lobe. 2. No suspicious masses seen. 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 3. Extension of 3.1 cm mass into the base of the bladder, thought to arise from the prostate. This is only slightly more prominent than in 2012, and likely reflects the patient's baseline. Enlarged prostate noted. Would correlate with PSA,  as deemed clinically appropriate. 4. Extension of the distal descending and proximal sigmoid colon into a moderate left inguinal hernia, without evidence for obstruction. This is somewhat more prominent than in 2012. 5. Trace right-sided pleural fluid, with minimal bibasilar atelectasis. 6. Question of 2.7 cm nodule at the right thyroid lobe. Consider further evaluation with thyroid ultrasound. If patient is clinically hyperthyroid, consider nuclear medicine thyroid uptake and scan. 7. Several left renal parapelvic cysts noted. 6 mm hypodensity in the periphery of the right hepatic lobe is nonspecific but may reflect a small cyst. 8. Cholelithiasis. Gallbladder contracted and otherwise unremarkable. 9. Mild calcification along the abdominal aorta and its branches. 10. Mild scattered diverticulosis along the distal transverse, descending and proximal sigmoid colon. Critical Value/emergent results were called by telephone at  the time of interpretation on 01/07/2015 at 1:26 am to [place] [facility], who verbally acknowledged these results. Electronically Signed   By: Garald Balding M.D.   On: 01/07/2015 01:28   Mr Brain Wo Contrast  01/05/2015  CLINICAL DATA:  Confusion, worsening over the last several days. EXAM: MRI HEAD WITHOUT CONTRAST TECHNIQUE: Multiplanar, multiecho pulse sequences of the brain and surrounding structures were obtained without intravenous contrast. COMPARISON:  Head CT 12/31/2014 FINDINGS: The brainstem is normal. There is mild cerebellar atrophy but no focal cerebellar insult. The cerebral hemispheres show a background pattern of chronic small vessel disease affecting the deep white matter. 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 No cortical or large vessel territory infarction. There is a mass lesion infiltrating the splenium of the corpus callosum and extending into the posterior medial temporal lobes bilaterally. There are petechial blood products within the substance of this mass in the region of the splenium to the right of midline. 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 Lymphoma and acute toxic demyelination are considered but felt much less likely. No hydrocephalus. No extra-axial fluid collection. No pituitary mass. Sinuses, middle ears and mastoids are clear. IMPRESSION: Tumor infiltrating the splenium of the corpus callosum and crossing bilaterally into the posterior medial temporal lobes. Some internal blood products. 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 Electronically Signed   By: Nelson Chimes M.D.   On: 01/05/2015 19:00   Mr Jeri Cos [id number] Contrast  01/10/2015  ADDENDUM REPORT: 01/10/2015 20:19 ADDENDUM: After further investigation, patient did receive iron supplement (ferriheme) 01/07/2015 and 01/08/2015 which is felt to be responsible for the altered signal intensity of the brain  mass. Therefore hemorrhage into the mass is felt unlikely as cause for the interval change. This was discussed with the patient's nurse Claiborne Billings. Electronically Signed   By: Genia Del M.D.   On: 01/10/2015 20:19  01/10/2015  CLINICAL DATA:  75 year old hypertensive diabetic male with increasing confusion. Subsequent encounter. EXAM: MRI HEAD WITHOUT AND WITH CONTRAST TECHNIQUE: Multiplanar, multiecho pulse sequences of the brain and surrounding structures were obtained without and with intravenous contrast. CONTRAST:  2mL MULTIHANCE GADOBENATE DIMEGLUMINE 529 MG/ML IV SOLN COMPARISON:  01/05/2015 brain MR. 12/31/2014 head CT. FINDINGS: Bulky irregular mass centered in the splenium of corpus callosum spans over 6.8 x 3.2 x 8 2.8 cm with extension into the posterior medial temporal lobes larger on the right. Marked change in appearance of this lesion on the pre contrast imaging (confirmed with technologist contrast was not given prior to sagittal T1 imaging). The appearance suggests that the patient has bled into the tumor in the interim. Alternatively,  the abnormal T1 imaging may be related to recent medication administration, but does not appear to be case after talking with the patient's nurse. Patient received iodinated contrast for inferior vena cava filter placement today however this should not cause this appearance. This could be further assessed on follow-up T1 weighted imaging to determine if this represents artifact or true hemorrhage. The lesion is highly suspicious for a glioblastoma. Lymphoma is a secondary less likely consideration. Demyelinating process felt to be a secondary much less likely consideration. There may be a tiny daughter lesion within the left temporal lobe. No acute infarct. Mild chronic small vessel disease type changes. Global atrophy without hydrocephalus. Major intracranial vascular structures are patent. IMPRESSION: Bulky lesion within the splenium corpus callosum with extension  to the posterior medial type lobes is highly suspicious for glioblastoma with other considerations felt less likely as noted above. When compared to the prior examination, there has been a change in the signal characteristics of this lesion which may be related to interval hemorrhage into lesion (assuming that there is not a confounding factor such as administration of medication which causes T1 hyperintensity and gradient abnormality). This could be further assessed on follow-up T1 weighted imaging to determine if this represents artifact or true hemorrhage. There may be a tiny daughter lesion within the left temporal lobe. These results were called by telephone at the time of interpretation on 01/10/2015 at 7:29 pm to [facility] patients nurse who verbally acknowledged these results. Electronically Signed: By: Genia Del M.D. On: 01/10/2015 19:32   US Scrotum  01/04/2015  CLINICAL DATA:  Left testicular pain. EXAM: SCROTAL ULTRASOUND DOPPLER ULTRASOUND OF THE TESTICLES TECHNIQUE: Complete ultrasound examination of the testicles, epididymis, and other scrotal structures was performed. Color and spectral Doppler ultrasound were also utilized to evaluate blood flow to the testicles. COMPARISON:  None. FINDINGS: Right testicle Measurements: 5.0 x 2.4 x 3.7 cm. No mass or microlithiasis visualized. Left testicle Measurements: 5.5 x 2.1 x 2.6 cm. Small intra testicular cyst measuring 4 mm maximally. No mass or microlithiasis visualized. Right epididymis:  Normal in size and appearance. Left epididymis:  Normal in size and appearance. Hydrocele:  None visualized. Varicocele:  None visualized. Pulsed Doppler interrogation of both testes demonstrates normal low resistance arterial and venous waveforms bilaterally. IMPRESSION: Small benign-appearing cyst in the left testicle. Otherwise unremarkable study. Electronically Signed   By: Rolm Baptise M.D.   On: 01/04/2015 19:03   Ct Abdomen Pelvis W Contrast  01/07/2015   CLINICAL DATA:  Confusion. Recently diagnosed with corpus callosal and posterior medial temporal lobe mass. Assess for primary malignancy. Initial encounter. EXAM: CT CHEST, ABDOMEN, AND PELVIS WITH CONTRAST TECHNIQUE: Multidetector CT imaging of the chest, abdomen and pelvis was performed following the standard protocol during bolus administration of intravenous contrast. CONTRAST:  173mL OMNIPAQUE IOHEXOL 300 MG/ML  SOLN COMPARISON:  Chest radiograph performed 12/31/2014, and CT of the abdomen and pelvis from 04/07/2010 FINDINGS: CT CHEST Minimal bibasilar atelectasis is noted bilaterally. Trace right-sided pleural fluid is noted. The lungs are otherwise clear. No focal consolidation or pneumothorax is seen. No suspicious masses are identified. A small pulmonary embolus is incidentally noted within a segmental branch to the left lower lobe. The mediastinum is unremarkable in appearance. No mediastinal lymphadenopathy is seen. No pericardial effusion is identified. The great vessels are grossly unremarkable in appearance. There is asymmetric prominence of the right thyroid lobe, which may reflect a 2.7 cm nodule. No axillary lymphadenopathy is appreciated. A moderate hiatal hernia  is noted. No acute osseous abnormalities are identified. CT ABDOMEN AND PELVIS A 6 mm hypodensity in the periphery of the right hepatic lobe is nonspecific but may reflect a small cyst. The spleen is unremarkable in appearance. A few stones are seen dependently within the gallbladder. The gallbladder is contracted and otherwise unremarkable. The pancreas and adrenal glands are unremarkable. Several left renal parapelvic cysts are noted. Nonspecific perinephric stranding is noted on the left side. There is no evidence of hydronephrosis. No renal or ureteral stones are seen. No free fluid is identified. The small bowel is unremarkable in appearance. The stomach is within normal limits. No acute vascular abnormalities are seen. Mild  calcification is noted along the abdominal aorta and its branches. The appendix is normal in caliber, without evidence of appendicitis. Contrast progresses to the level of the sigmoid colon. Mild scattered diverticulosis is noted along the distal transverse, descending and proximal sigmoid colon. The distal descending and proximal sigmoid colon extend into a moderate left inguinal hernia, without evidence for obstruction. Extension of the colon into the hernia is more prominent than in 2012. The bladder is mildly distended. There is extension of a 3.1 cm mass into the base of the bladder, thought to arise from the prostate. This is only slightly more prominent than in 2012, and likely reflects the patient's baseline. The prostate is enlarged, measuring 5.3 cm in transverse dimension. No inguinal lymphadenopathy is seen. No acute osseous abnormalities are identified. There is mild grade 1 retrolisthesis of L2 on L3, reflecting underlying facet disease. IMPRESSION: 1. Small pulmonary embolus incidentally noted within a segmental branch to the left lower lung lobe. 2. No suspicious masses seen. 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 3. Extension of 3.1 cm mass into the base of the bladder, thought to arise from the prostate. This is only slightly more prominent than in 2012, and likely reflects the patient's baseline. Enlarged prostate noted. Would correlate with PSA, as deemed clinically appropriate. 4. Extension of the distal descending and proximal sigmoid colon into a moderate left inguinal hernia, without evidence for obstruction. This is somewhat more prominent than in 2012. 5. Trace right-sided pleural fluid, with minimal bibasilar atelectasis. 6. Question of 2.7 cm nodule at the right thyroid lobe. Consider further evaluation with thyroid ultrasound. If patient is clinically hyperthyroid, consider nuclear medicine thyroid uptake and scan. 7.  Several left renal parapelvic cysts noted. 6 mm hypodensity in the periphery of the right hepatic lobe is nonspecific but may reflect a small cyst. 8. Cholelithiasis. Gallbladder contracted and otherwise unremarkable. 9. Mild calcification along the abdominal aorta and its branches. 10. Mild scattered diverticulosis along the distal transverse, descending and proximal sigmoid colon. Critical Value/emergent results were called by telephone at the time of interpretation on 01/07/2015 at 1:26 am to [place] at [facility], who verbally acknowledged these results. Electronically Signed   By: Garald Balding M.D.   On: 01/07/2015 01:28   Ir Ivc Filter Plmt / S&i /img Guid/mod Sed  01/10/2015  INDICATION: History of pulmonary embolism and lower extremity DVT. Patient with recent MR findings concerning for either GBM or CNS lymphoma. Request made for placement of an IVC filter for (at least temporary) caval interruption. EXAM: ULTRASOUND GUIDANCE FOR VASCULAR ACCESS IVC CATHETERIZATION AND VENOGRAM IVC FILTER INSERTION COMPARISON:  CT the chest, abdomen pelvis - 01/06/2015; brain MRI -01/05/2015 MEDICATIONS: Fentanyl 50 mcg IV ANESTHESIA/SEDATION: Sedation Time 15 minutes CONTRAST:  60 cc Omnipaque  300 FLUOROSCOPY TIME:  48 minutes (81 mGy) COMPLICATIONS: None immediate PROCEDURE: Informed consent was obtained from the patient following explanation of the procedure, risks, benefits and alternatives. The patient understands, agrees and consents for the procedure. All questions were addressed. A time out was performed prior to the initiation of the procedure. Maximal barrier sterile technique utilized including caps, mask, sterile gowns, sterile gloves, large sterile drape, hand hygiene, and Betadine prep. Under sterile condition and local anesthesia, right internal jugular venous access was performed with ultrasound. An ultrasound image was saved and sent to PACS. Over a guidewire, the IVC filter delivery sheath and  inner dilator were advanced into the IVC just above the IVC bifurcation. Contrast injection was performed for an IVC venogram. Through the delivery sheath, a retrievable Denali IVC filter was deployed below the level of the renal veins and above the IVC bifurcation. Limited post deployment venacavagram was performed. The delivery sheath was removed and hemostasis was obtained with manual compression. A dressing was placed. The patient tolerated the procedure well without immediate post procedural complication. FINDINGS: The IVC is patent. No evidence of thrombus, stenosis, or occlusion. No variant venous anatomy. Successful placement of the IVC filter below the level of the renal veins. IMPRESSION: Successful ultrasound and fluoroscopically guided placement of an infrarenal retrievable IVC filter via right jugular approach. This IVC filter is potentially retrievable. The patient will be assessed for filter retrieval by Interventional Radiology in approximately 8-12 weeks. Further recommendations regarding filter retrieval, continued surveillance or declaration of device permanence, will be made at that time. Electronically Signed   By: Sandi Mariscal M.D.   On: 01/10/2015 14:18   [place] Art/ven Flow Abd Pelv Doppler  01/04/2015  CLINICAL DATA:  Left testicular pain. EXAM: SCROTAL ULTRASOUND DOPPLER ULTRASOUND OF THE TESTICLES TECHNIQUE: Complete ultrasound examination of the testicles, epididymis, and other scrotal structures was performed. Color and spectral Doppler ultrasound were also utilized to evaluate blood flow to the testicles. COMPARISON:  None. FINDINGS: Right testicle Measurements: 5.0 x 2.4 x 3.7 cm. No mass or microlithiasis visualized. Left testicle Measurements: 5.5 x 2.1 x 2.6 cm. Small intra testicular cyst measuring 4 mm maximally. No mass or microlithiasis visualized. Right epididymis:  Normal in size and appearance. Left epididymis:  Normal in size and appearance. Hydrocele:  None visualized.  Varicocele:  None visualized. Pulsed Doppler interrogation of both testes demonstrates normal low resistance arterial and venous waveforms bilaterally. IMPRESSION: Small benign-appearing cyst in the left testicle. Otherwise unremarkable study. Electronically Signed   By: Rolm Baptise M.D.   On: 01/04/2015 19:03   Dg Chest Port 1 View  01/11/2015  CLINICAL DATA:  Fever EXAM: PORTABLE CHEST 1 VIEW COMPARISON:  Chest CT 01/06/2015 FINDINGS: Normal cardiac silhouette. There is diffuse linear interstitial pattern in the lungs. No focal infiltrate. No pneumothorax. IMPRESSION: Interstitial edema pattern. Electronically Signed   By: Suzy Bouchard M.D.   On: 01/11/2015 23:03    DISCHARGE EXAMINATION: Filed Vitals:   01/18/15 0500 01/18/15 1518 01/18/15 2205 01/19/15 0510  BP: 156/94 152/69 166/73 157/77  Pulse: 95 102 91 86  Temp: 98 F (36.7 C) 98.3 F (36.8 C) 99.3 F (37.4 C) 98.6 F (37 C)  TempSrc: Oral Oral Oral Oral  Resp: 16 16 20 16   Height:      Weight:      SpO2: 97% 95% 93%    General appearance: alert, delirious and mild distress Resp: Diminished air entry at the bases. No crackles or wheezing Cardio: regular  rate and rhythm, S1, S2 normal, no murmur, click, rub or gallop GI: soft, non-tender; bowel sounds normal; no masses,  no organomegaly Extremities: extremities normal, atraumatic, no cyanosis or edema Neurologic: Awake. Moving all his extremities. Agitated at times.  DISPOSITION: Residential hospice    ALLERGIES:  Allergies  Allergen Reactions  . Strawberry Flavor Hives  . Penicillins Rash    Has patient had a PCN reaction causing immediate rash, facial/tongue/throat swelling, SOB or lightheadedness with hypotension: No Has patient had a PCN reaction causing severe rash involving mucus membranes or skin necrosis: No Has patient had a PCN reaction that required hospitalization No Has patient had a PCN reaction occurring within the last 10 years: No If all of the  above answers are "NO", then may proceed with Cephalosporin use.      Current Discharge Medication List    START taking these medications   Details  diphenhydrAMINE (BENADRYL) 25 mg capsule Take 1 capsule (25 mg total) by mouth every 6 (six) hours as needed for itching. Qty: 30 capsule, Refills: 0    levETIRAcetam (KEPPRA) 500 MG tablet Take 1 tablet (500 mg total) by mouth 2 (two) times daily.    !! LORazepam (ATIVAN) 2 MG/ML injection Inject 0.5 mLs (1 mg total) into the vein every 4 (four) hours. Qty: 1 mL, Refills: 0    !! LORazepam (ATIVAN) 2 MG/ML injection Inject 0.5-1 mLs (1-2 mg total) into the vein every 4 (four) hours as needed (agitation). Qty: 1 mL, Refills: 0    !! morphine 2 MG/ML injection Inject 0.5-1 mLs (1-2 mg total) into the vein every 2 (two) hours as needed. Qty: 1 mL, Refills: 0    !! morphine 2 MG/ML injection Inject 1 mL (2 mg total) into the vein every 6 (six) hours. Qty: 1 mL, Refills: 0    risperiDONE (RISPERDAL) 0.5 MG tablet Take 1 tablet (0.5 mg total) by mouth at bedtime.     !! - Potential duplicate medications found. Please discuss with provider.    CONTINUE these medications which have NOT CHANGED   Details  hydrocortisone (ANUSOL-HC) 2.5 % rectal cream Place rectally 2 (two) times daily. Qty: 30 g, Refills: 0      STOP taking these medications     aspirin EC 81 MG tablet      Cholecalciferol (VITAMIN D3) 2000 UNITS capsule      Cinnamon 500 MG capsule      esomeprazole (NEXIUM) 20 MG capsule      fluticasone (FLONASE) 50 MCG/ACT nasal spray      metaxalone (SKELAXIN) 800 MG tablet      metFORMIN (GLUCOPHAGE) 1000 MG tablet      Misc Natural Products (OSTEO BI-FLEX JOINT SHIELD) TABS      Multiple Vitamin (MULTIVITAMIN WITH MINERALS) TABS      OSENI 25-15 MG TABS      Specialty Vitamins Products (PROSTATE PO)      telmisartan-hydrochlorothiazide (MICARDIS HCT) 80-25 MG per tablet      vitamin E 200 UNIT capsule       traMADol (ULTRAM) 50 MG tablet          TOTAL DISCHARGE TIME: 35 minutes  [facility] Pager [phone]  01/19/2015, 11:17 AM

## 2015-01-19 NOTE — Progress Notes (Signed)
CSW received notification from [facility] [place] that pt was approved for admission to hospice home and bed available today.   CSW notified MD.   CSW facilitated pt discharge needs including contacting facility, notifying hospice home of [place] that pt discharge summary available in [place], confirming with RN that report had been called, discussing with pt brother at bedside, notifying pt wife via telephone who plans to go to [facility] [place] once pt arrives, and arranging ambulance transport for pt to [facility] [place].  Pt wife coping appropriately with pt transfer to [facility] and eager for pt to be comfortable.   No further social work needs identified at this time.  CSW signing off.   Alison Murray, MSW, [place] Work [phone]

## 2015-01-19 NOTE — Progress Notes (Signed)
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 He has mittens on his hands. I suspect these trying to pour at his catheters.  He is comfort care now. I certainly agree with this. 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  Given that he is comfort care now, I really think that his anticoagulation can be stopped. I don't think this really is going to benefit or improve his overall status. He has the filter). I think with anticoagulation, the risk of bleeding outweighs the benefit of anticoagulation.  I try to talk him today but he just absolutely had no idea what I was saying to him. I think this probably reflects the progression of his malignancy.  His hemoglobin is holding pretty steady. It was 11.3 yesterday. Hopefully this might be some positive effect from the IV on that he received.  I just feel bad that we are cannot treat him. I just don't think that treatment would improve his underlying mental status and the side effects of treatment I think outweigh the potential benefit.  I totally agree with moving him to a hospice facility. I think this would make life a lot easier for his family. He just cannot be cared for at home.  We will continue to pray for him. He is very nice. He just has a very tough problem that has affected a very sensitive part of his brain.  [name] E.  Ephesians 2:10

## 2015-01-19 NOTE — Clinical Social Work Placement (Signed)
   CLINICAL SOCIAL WORK PLACEMENT  NOTE  Date:  01/19/2015  Patient Details  Name: Aaron White MRN: [id number] Date of Birth: 03-29-1940  Clinical Social Work is seeking post-discharge placement for this patient at the [place] level of care (*CSW will initial, date and re-position this form in  chart as items are completed):  Yes   Patient/family provided with [place] Work Department's list of facilities offering this level of care within the geographic area requested by the patient (or if unable, by the patient's family).  Yes   Patient/family informed of their freedom to choose among providers that offer the needed level of care, that participate in Medicare, Medicaid or managed care program needed by the patient, have an available bed and are willing to accept the patient.  Yes   Patient/family informed of [facility]'s ownership interest in [facility] and [facility], as well as of the fact that they are under no obligation to receive care at these facilities.  PASRR submitted to EDS on 01/16/15     PASRR number received on 01/16/15     Existing PASRR number confirmed on       FL2 transmitted to all facilities in geographic area requested by pt/family on 01/16/15     FL2 transmitted to all facilities within larger geographic area on       Patient informed that his/her managed care company has contracts with or will negotiate with certain facilities, including the following:        No   Patient/family informed of bed offers received.  Patient chooses bed at Other - please specify in the comment section below: [facility] [place])     Physician recommends and patient chooses bed at      Patient to be transferred to Other - please specify in the comment section below: [facility] [place]) on 01/19/15.  Patient to be transferred to facility by ambulance [facility])     Patient family notified on 01/19/15 of  transfer.  Name of family member notified:  pt wife, Sheria Lang notified via telephone     PHYSICIAN Please sign FL2, Please sign DNR     Additional Comment:    _______________________________________________ Ladell Pier, LCSW 01/19/2015, 11:46 AM

## 2015-01-19 NOTE — Care Management Note (Signed)
Case Management Note  Patient Details  Name: Aaron White MRN: [id number] Date of Birth: August 15, 1940           Expected Discharge Date:   (unknown)               Expected Discharge Plan:  [place]  In-House Referral:  Clinical Social Work  Discharge planning Services  CM Consult  Post Acute Care Choice:    Choice offered to:     DME Arranged:    DME Agency:     HH Arranged:    [place] Agency:     Status of Service:  Completed, signed off  Medicare Important Message Given:  Yes Date Medicare IM Given:    Medicare IM give by:    Date Additional Medicare IM Given:    Additional Medicare Important Message give by:     If discussed at [place] of Stay Meetings, dates discussed:    Additional Comments:  Lynnell Catalan, RN 01/19/2015, 11:38 AM

## 2015-01-19 NOTE — Progress Notes (Signed)
Report called to [facility] at [facility]

## 2015-01-19 NOTE — Discharge Instructions (Signed)
Hospice °Hospice is a service that is designed to provide people who are terminally ill and their families with medical, spiritual, and psychological support. Its aim is to improve your quality of life by keeping you as alert and comfortable as possible. Hospice is performed by a team of health care professionals and volunteers who: °· Help keep you comfortable. Hospice can be provided in your home or in a homelike setting. The hospice staff works with your family and friends to help meet your needs. You will enjoy the support of loved ones by receiving much of your basic care from family and friends. °· Provide pain relief and manage your symptoms. The staff supply all necessary medicines and equipment. °· Provide companionship when you are alone. °· Allow you and your family to rest. They may do light housekeeping, prepare meals, and run errands. °· Provide counseling. They will make sure your emotional, spiritual, and social needs and those of your family are being met. °· Provide spiritual care. Spiritual care is individualized to meet your needs and your family's needs. It may involve helping you look at what death means to you, say goodbye, or perform a specific religious ceremony or ritual. °Hospice teams often include: °· A nurse. °· A doctor. °· Social workers. °· Religious leaders (such as a chaplain). °· Trained volunteers. °WHEN SHOULD HOSPICE CARE BEGIN? °Most people who use hospice are believed to have fewer than 6 months to live. Your family and health care providers can help you decide when hospice services should begin. If your condition improves, you may discontinue the program. °WHAT SHOULD I CONSIDER BEFORE SELECTING A PROGRAM? °Most hospice programs are run by nonprofit, independent organizations. Some are affiliated with hospitals, nursing homes, or home health care agencies. Hospice programs can take place in the home or at a hospice center, hospital, or skilled nursing facility. When choosing  a hospice program, ask the following questions: °· What services are available to me? °· What services are offered to my loved ones? °· How involved are my loved ones? °· How involved is my health care provider? °· Who makes up the hospice care team? How are they trained or screened? °· How will my pain and symptoms be managed? °· If my circumstances change, can the services be provided in a different setting, such as my home or in the hospital? °· Is the program reviewed and licensed by the state or certified in some other way? °WHERE CAN I LEARN MORE ABOUT HOSPICE? °You can learn about existing hospice programs in your area from your health care providers. You can also read more about hospice online. The websites of the following organizations contain helpful information: °· The National Hospice and Palliative Care Organization (NHPCO). °· The Hospice Association of America (HAA). °· The Hospice Education Institute. °· The American Cancer Society (ACS). °· Hospice Net. °  °This information is not intended to replace advice given to you by your health care provider. Make sure you discuss any questions you have with your health care provider. °  °Document Released: 04/12/2003 Document Revised: 12/29/2012 Document Reviewed: 11/03/2012 °Elsevier Interactive Patient Education ©2016 Elsevier Inc. ° °

## 2015-01-19 NOTE — Progress Notes (Signed)
Pt discharged to [facility] via [place] in stable condition.

## 2015-02-03 ENCOUNTER — Encounter (INDEPENDENT_AMBULATORY_CARE_PROVIDER_SITE_OTHER): Payer: 59 | Admitting: Ophthalmology

## 2015-02-08 DEATH — deceased

## 2017-06-12 IMAGING — DX DG CHEST 2V
2 series · 2 of 2 positions shown · non-contrast
Comparison: Prior chest x-ray 04/08/2010

CLINICAL DATA: 74-year-old male with altered mental status

EXAM:
CHEST  2 VIEW

[chest pa]
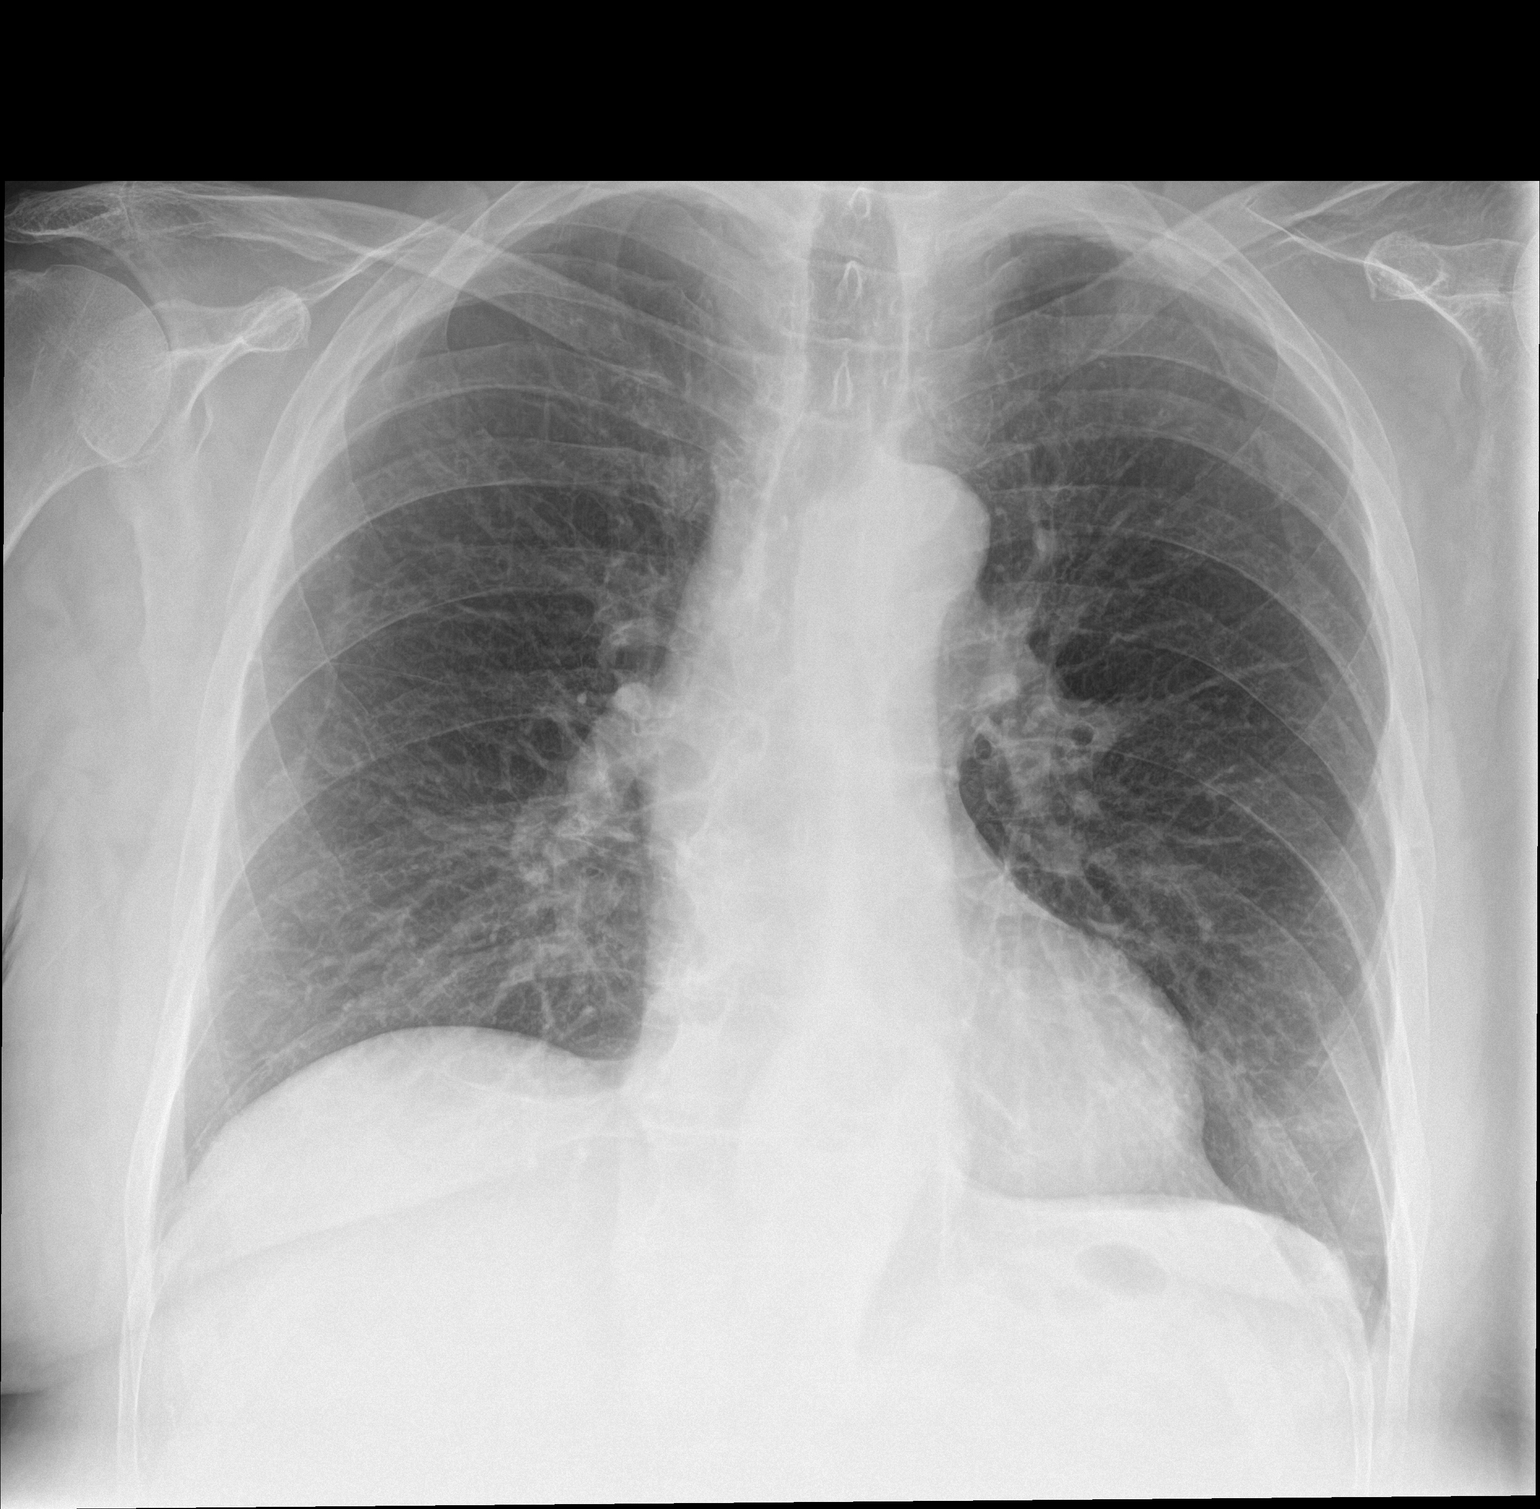

[chest lat]
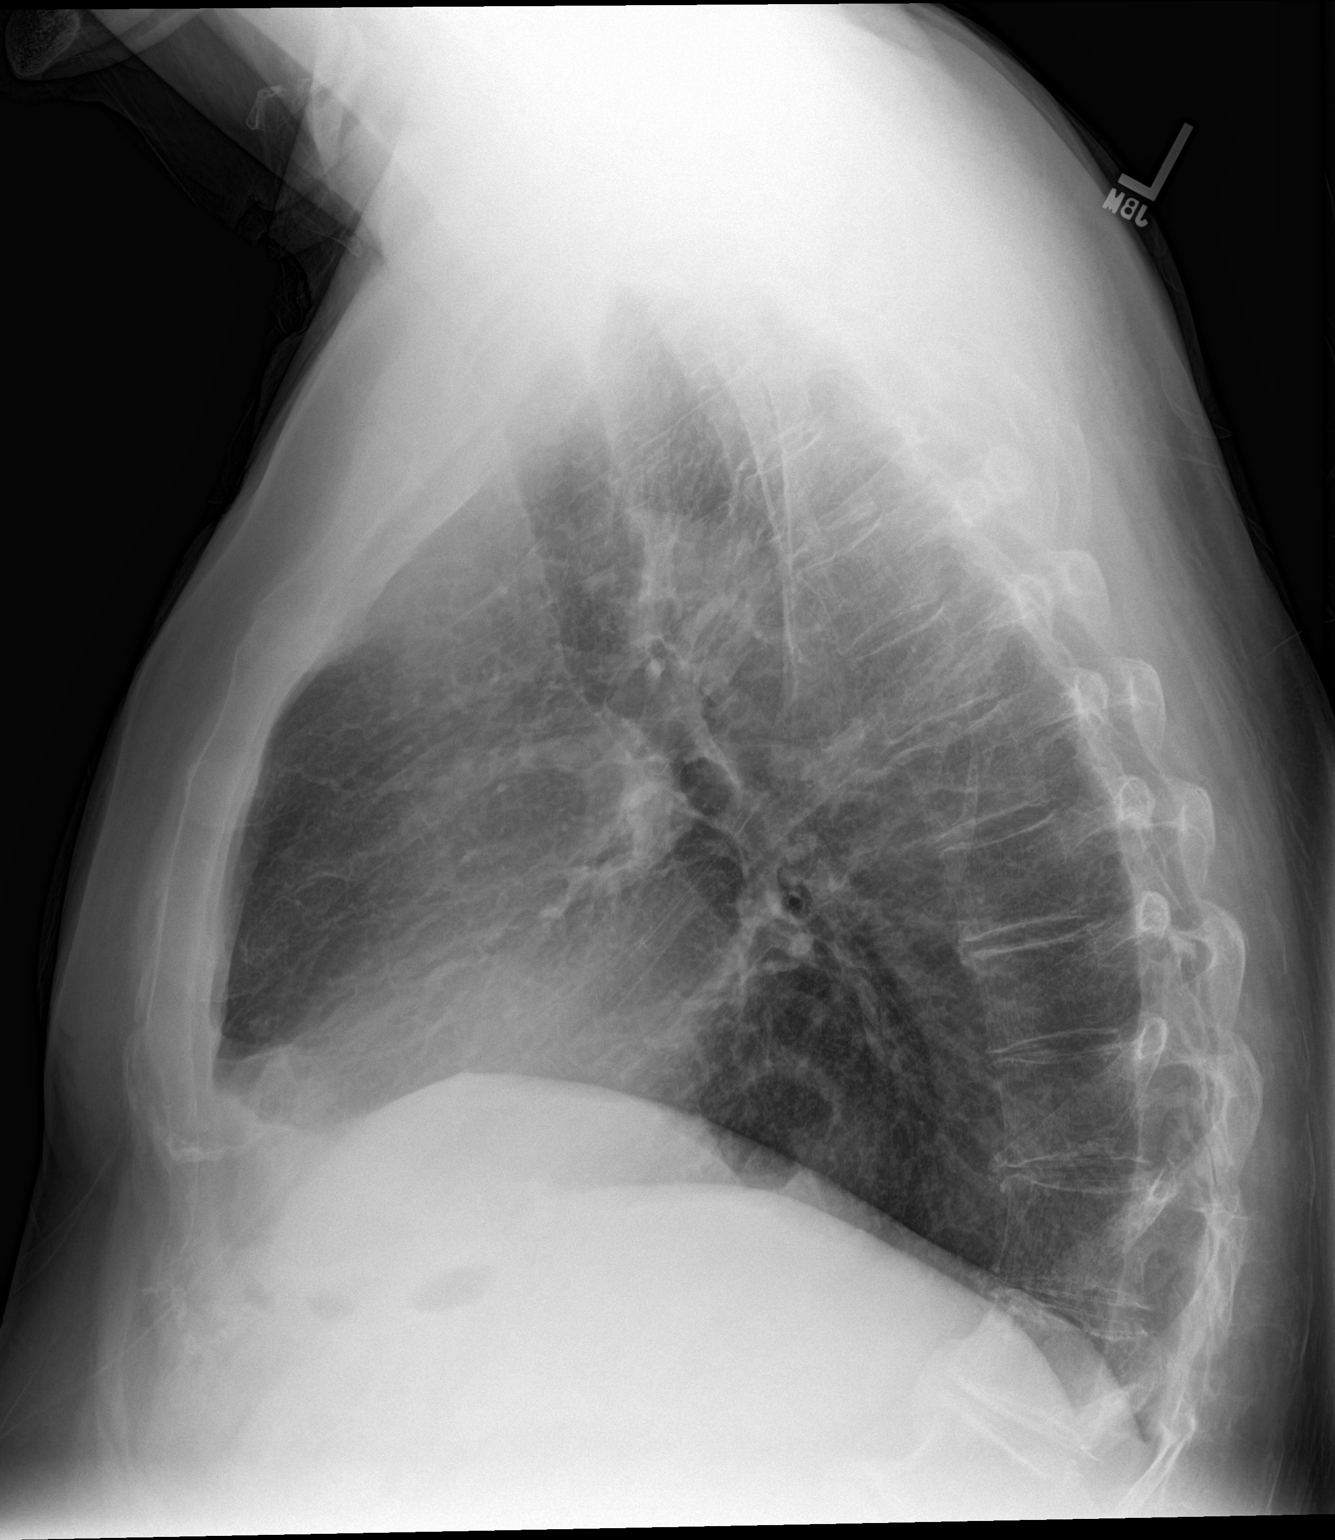

[2 of 2 positions shown; findings below may reference images not displayed]

FINDINGS: Cardiac and mediastinal contours remain within normal limits. No
focal airspace consolidation, pleural effusion, pulmonary edema or
pneumothorax. Stable mild bronchitic change. No acute osseous
abnormality.
IMPRESSION: No active cardiopulmonary disease.

## 2017-06-12 IMAGING — CT CT HEAD W/O CM
1 series · 16 of 30 positions shown, 20 images · non-contrast
Comparison: None.

CLINICAL DATA: Worsening confusion

EXAM:
CT HEAD WITHOUT CONTRAST
TECHNIQUE: Contiguous axial images were obtained from the base of the skull
through the vertex without intravenous contrast.

[Series 2: head 5.0 h30s · axial · 0.44mm/px · z∈[-152,-12]mm · 16 of 32 slices shown, 20 images]
[im 2/32  brain]
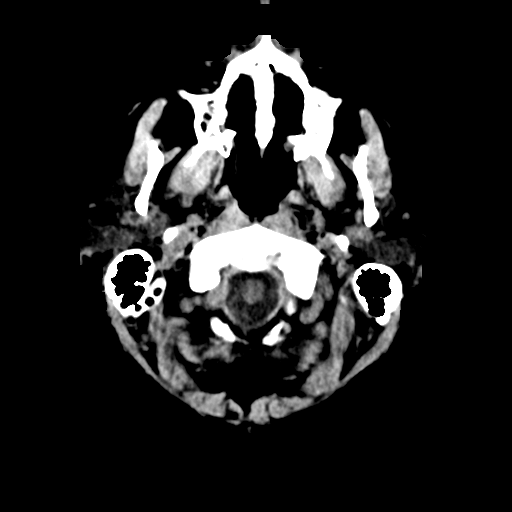
[im 2/32  bone]
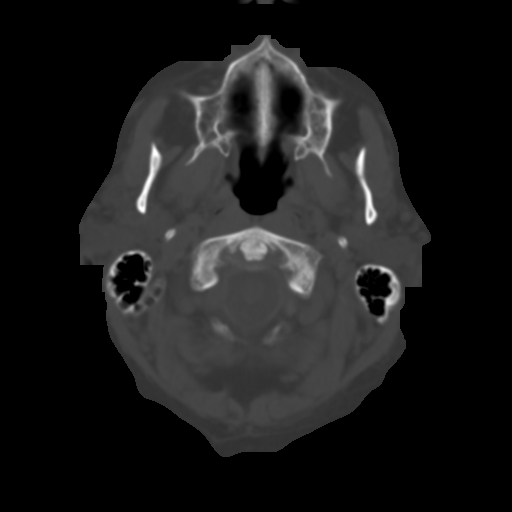
[im 4/32  brain]
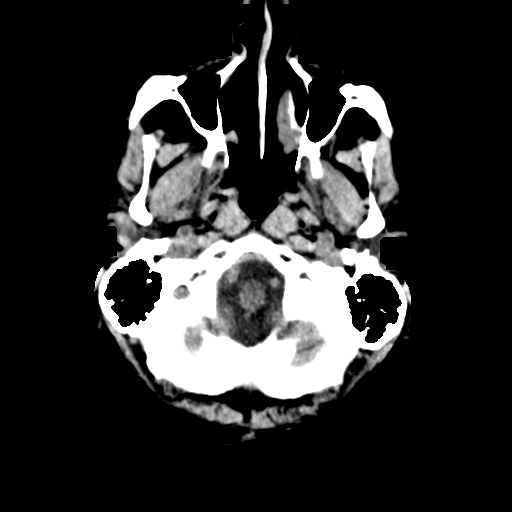
[im 6/32  brain]
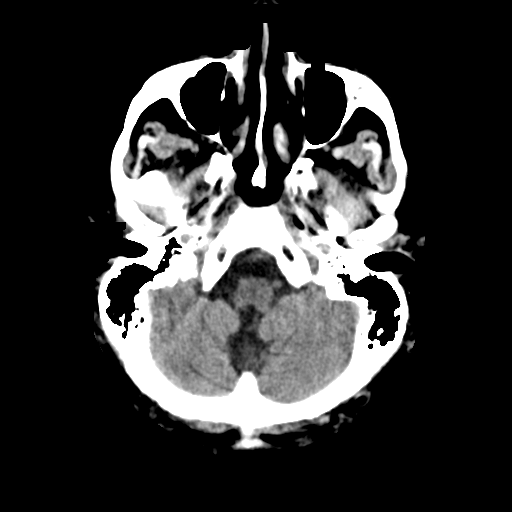
[im 8/32  brain]
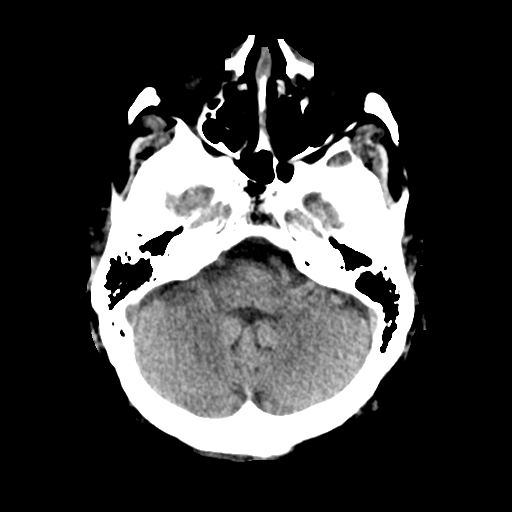
[im 9/32  brain]
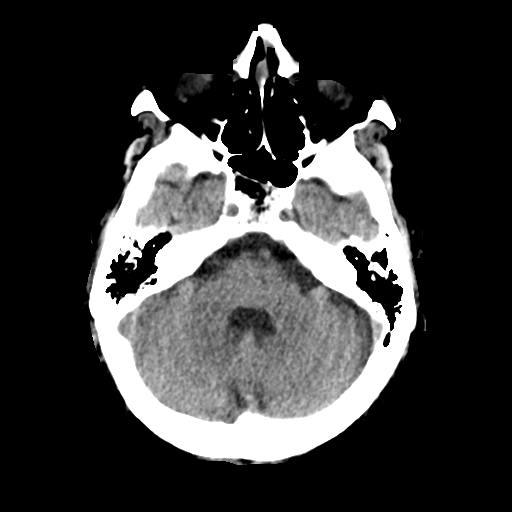
[im 9/32  bone]
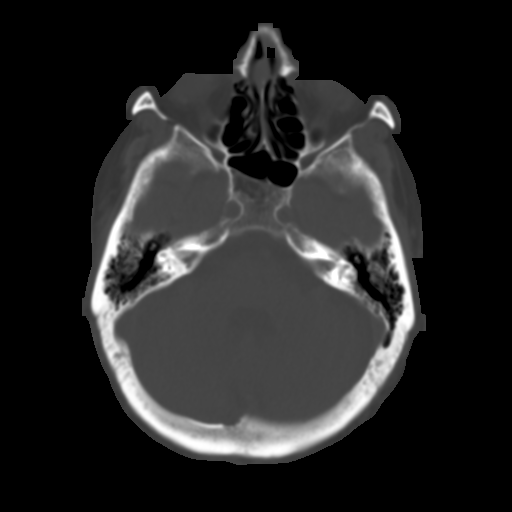
[im 11/32  brain]
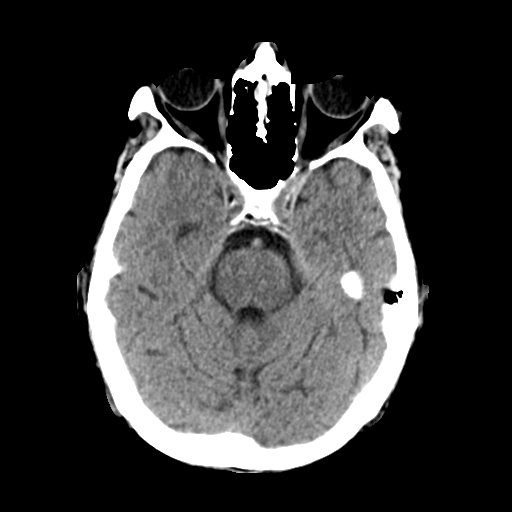
[im 13/32  brain]
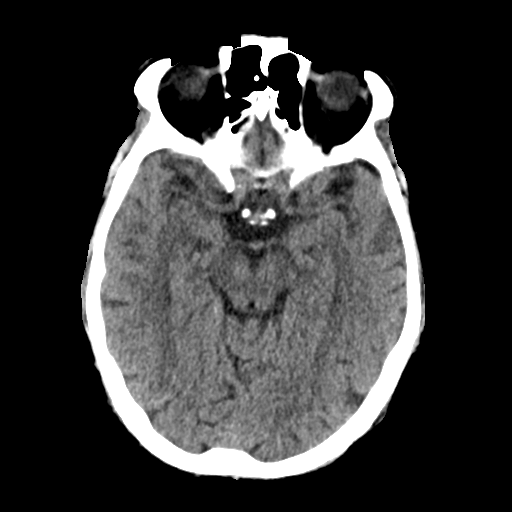
[im 15/32  brain]
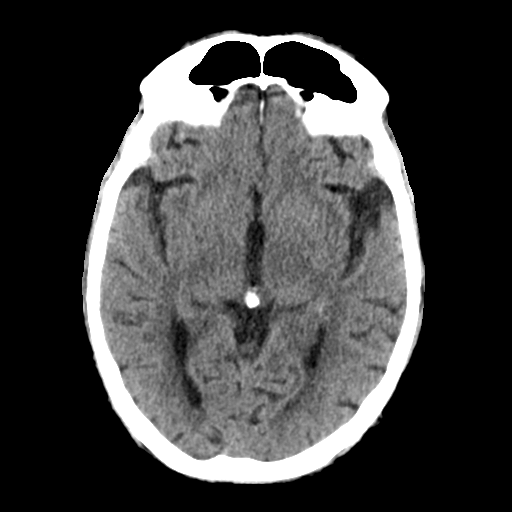
[im 17/32  brain]
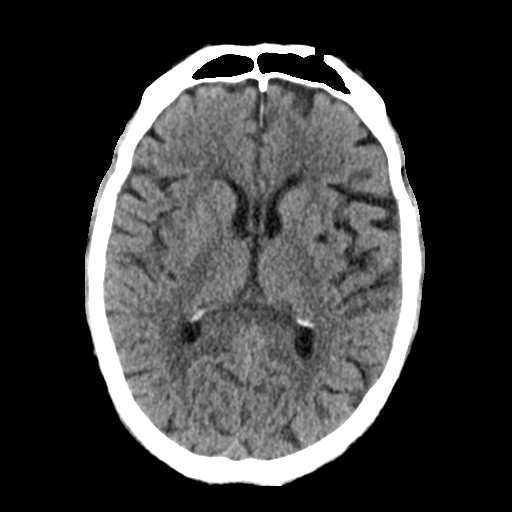
[im 17/32  bone]
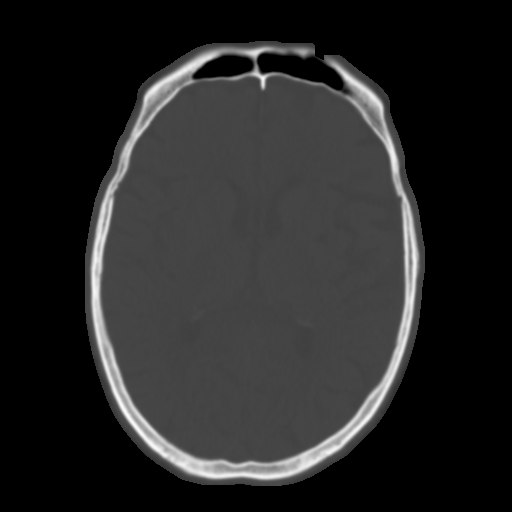
[im 19/32  brain]
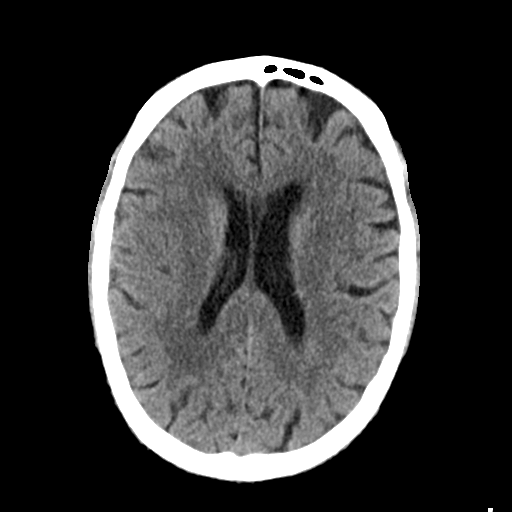
[im 21/32  brain]
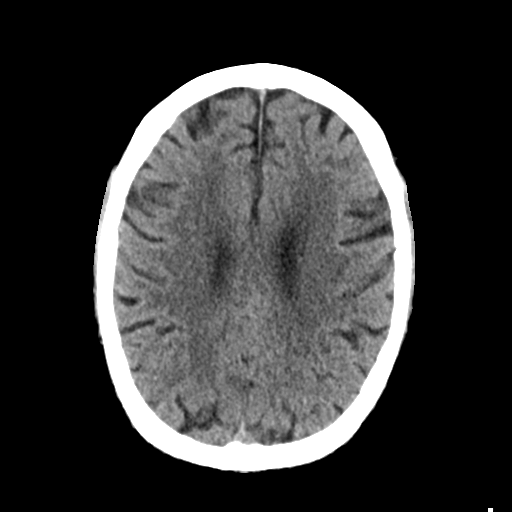
[im 23/32  brain]
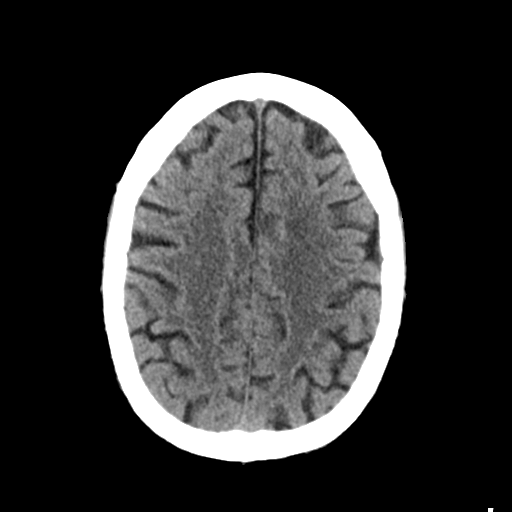
[im 24/32  brain]
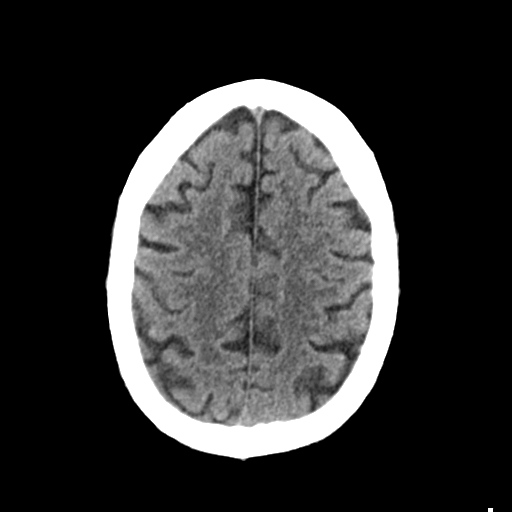
[im 24/32  bone]
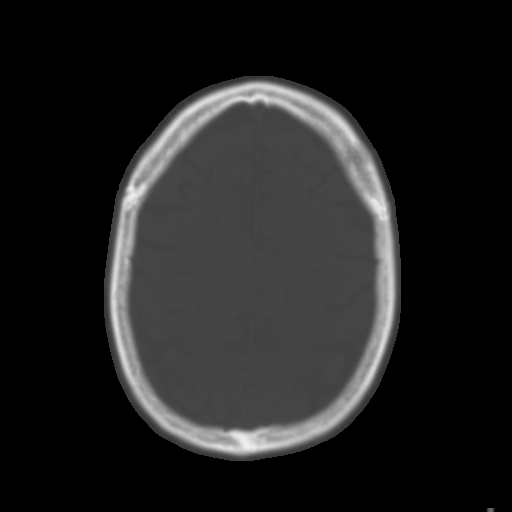
[im 26/32  brain]
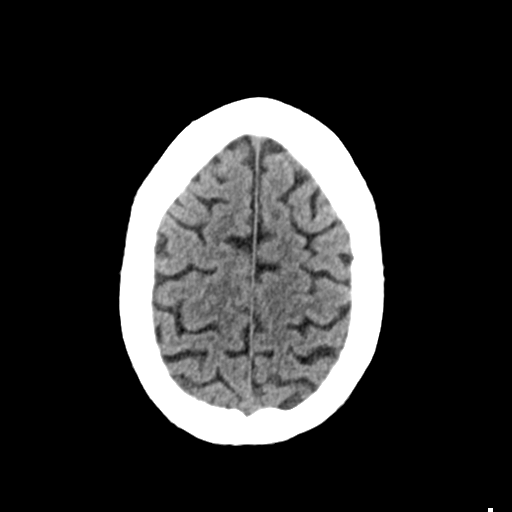
[im 28/32  brain]
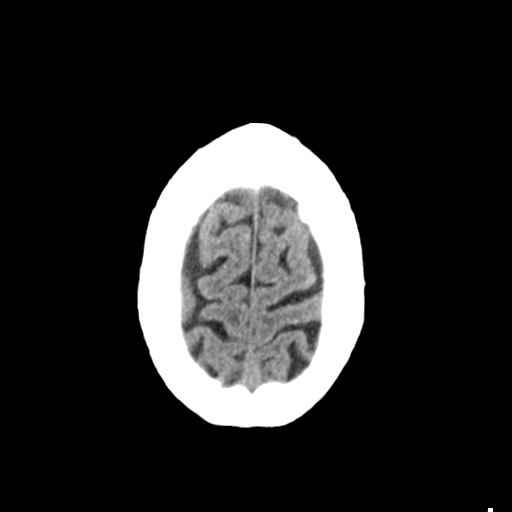
[im 30/32  brain]
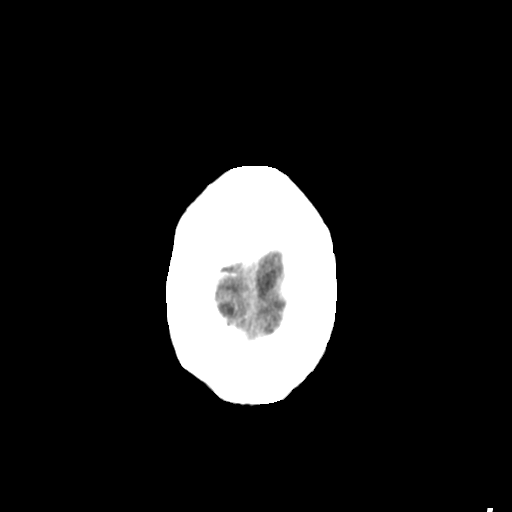

[16 of 30 positions shown; findings below may reference images not displayed]

FINDINGS: No skull fracture is noted. Paranasal sinuses and mastoid air cells
are unremarkable. No intracranial hemorrhage, mass effect or midline
shift.

No acute cortical infarction. No mass lesion is noted on this
unenhanced scan. Mild cerebral atrophy. Tiny lacunar infarct or
prominent perivascular space noted in left basal ganglia.
IMPRESSION: No acute intracranial abnormality.  Mild cerebral atrophy.
